# Patient Record
Sex: Female | Born: 1965 | Race: White | Hispanic: No | Smoking: Never smoker
Health system: Southern US, Community
[De-identification: ages and names within clinical notes are randomized; demographics above are authoritative.]

## PROBLEM LIST (undated history)

## (undated) DIAGNOSIS — R112 Nausea with vomiting, unspecified: Secondary | ICD-10-CM

## (undated) DIAGNOSIS — K219 Gastro-esophageal reflux disease without esophagitis: Secondary | ICD-10-CM

## (undated) DIAGNOSIS — I10 Essential (primary) hypertension: Secondary | ICD-10-CM

## (undated) DIAGNOSIS — F419 Anxiety disorder, unspecified: Secondary | ICD-10-CM

## (undated) DIAGNOSIS — T4145XA Adverse effect of unspecified anesthetic, initial encounter: Secondary | ICD-10-CM

## (undated) DIAGNOSIS — R251 Tremor, unspecified: Secondary | ICD-10-CM

## (undated) DIAGNOSIS — K259 Gastric ulcer, unspecified as acute or chronic, without hemorrhage or perforation: Secondary | ICD-10-CM

## (undated) DIAGNOSIS — T8859XA Other complications of anesthesia, initial encounter: Secondary | ICD-10-CM

## (undated) DIAGNOSIS — T1491XA Suicide attempt, initial encounter: Secondary | ICD-10-CM

## (undated) DIAGNOSIS — F32A Depression, unspecified: Secondary | ICD-10-CM

## (undated) DIAGNOSIS — G43909 Migraine, unspecified, not intractable, without status migrainosus: Secondary | ICD-10-CM

## (undated) DIAGNOSIS — F329 Major depressive disorder, single episode, unspecified: Secondary | ICD-10-CM

## (undated) DIAGNOSIS — D122 Benign neoplasm of ascending colon: Secondary | ICD-10-CM

## (undated) DIAGNOSIS — K635 Polyp of colon: Secondary | ICD-10-CM

## (undated) DIAGNOSIS — G44219 Episodic tension-type headache, not intractable: Secondary | ICD-10-CM

## (undated) DIAGNOSIS — Z9889 Other specified postprocedural states: Secondary | ICD-10-CM

## (undated) HISTORY — DX: Polyp of colon: K63.5

## (undated) HISTORY — DX: Depression, unspecified: F32.A

## (undated) HISTORY — DX: Major depressive disorder, single episode, unspecified: F32.9

## (undated) HISTORY — DX: Benign neoplasm of ascending colon: D12.2

## (undated) HISTORY — DX: Gastric ulcer, unspecified as acute or chronic, without hemorrhage or perforation: K25.9

## (undated) HISTORY — DX: Suicide attempt, initial encounter: T14.91XA

## (undated) HISTORY — DX: Tremor, unspecified: R25.1

## (undated) HISTORY — DX: Migraine, unspecified, not intractable, without status migrainosus: G43.909

## (undated) HISTORY — DX: Anxiety disorder, unspecified: F41.9

## (undated) HISTORY — DX: Episodic tension-type headache, not intractable: G44.219

---

## 1990-01-04 DIAGNOSIS — T1491XA Suicide attempt, initial encounter: Secondary | ICD-10-CM

## 1990-01-04 HISTORY — DX: Suicide attempt, initial encounter: T14.91XA

## 2001-07-17 ENCOUNTER — Other Ambulatory Visit: Admission: RE | Admit: 2001-07-17 | Discharge: 2001-07-17 | Payer: Self-pay | Admitting: Internal Medicine

## 2005-02-12 ENCOUNTER — Ambulatory Visit: Payer: Self-pay | Admitting: Podiatry

## 2006-01-10 ENCOUNTER — Ambulatory Visit: Payer: Self-pay | Admitting: Family Medicine

## 2006-12-14 ENCOUNTER — Emergency Department: Payer: Self-pay | Admitting: Emergency Medicine

## 2007-01-05 HISTORY — PX: HEEL SPUR EXCISION: SHX1733

## 2008-01-22 ENCOUNTER — Emergency Department: Payer: Self-pay | Admitting: Emergency Medicine

## 2008-11-01 ENCOUNTER — Emergency Department: Payer: Self-pay | Admitting: Internal Medicine

## 2009-07-28 ENCOUNTER — Other Ambulatory Visit: Payer: Self-pay | Admitting: Emergency Medicine

## 2009-08-28 ENCOUNTER — Ambulatory Visit: Payer: Self-pay | Admitting: Unknown Physician Specialty

## 2010-01-04 HISTORY — PX: ABDOMINAL HYSTERECTOMY: SHX81

## 2010-01-04 HISTORY — PX: TOTAL ABDOMINAL HYSTERECTOMY W/ BILATERAL SALPINGOOPHORECTOMY: SHX83

## 2010-01-04 HISTORY — PX: BILATERAL SALPINGOOPHORECTOMY: SHX1223

## 2010-01-04 LAB — HM PAP SMEAR

## 2010-01-28 ENCOUNTER — Ambulatory Visit: Payer: Self-pay | Admitting: Obstetrics & Gynecology

## 2010-02-05 ENCOUNTER — Ambulatory Visit: Payer: Self-pay | Admitting: Obstetrics & Gynecology

## 2010-02-06 LAB — PATHOLOGY REPORT

## 2010-04-30 ENCOUNTER — Emergency Department: Payer: Self-pay | Admitting: Emergency Medicine

## 2010-07-09 ENCOUNTER — Emergency Department: Payer: Self-pay | Admitting: Emergency Medicine

## 2010-08-26 ENCOUNTER — Ambulatory Visit: Payer: Self-pay | Admitting: Specialist

## 2011-02-23 ENCOUNTER — Ambulatory Visit: Payer: Self-pay | Admitting: Specialist

## 2011-10-05 LAB — HM MAMMOGRAPHY

## 2011-11-01 ENCOUNTER — Ambulatory Visit: Payer: Self-pay | Admitting: Obstetrics & Gynecology

## 2012-01-31 ENCOUNTER — Emergency Department: Payer: Self-pay | Admitting: Emergency Medicine

## 2012-01-31 LAB — URINALYSIS, COMPLETE
Glucose,UR: NEGATIVE mg/dL (ref 0–75)
Nitrite: POSITIVE
Protein: 100
RBC,UR: 13 /HPF (ref 0–5)

## 2012-01-31 LAB — COMPREHENSIVE METABOLIC PANEL
Albumin: 3.6 g/dL (ref 3.4–5.0)
BUN: 13 mg/dL (ref 7–18)
Bilirubin,Total: 0.7 mg/dL (ref 0.2–1.0)
Co2: 22 mmol/L (ref 21–32)
EGFR (African American): >60
Glucose: 103 mg/dL — ABNORMAL HIGH (ref 65–99)
SGOT(AST): 18 U/L (ref 15–37)
Sodium: 138 mmol/L (ref 136–145)
Total Protein: 7.8 g/dL (ref 6.4–8.2)

## 2012-01-31 LAB — CBC
HGB: 14.4 g/dL (ref 12.0–16.0)
MCH: 31 pg (ref 26.0–34.0)
WBC: 10.7 10*3/uL (ref 3.6–11.0)

## 2012-02-04 LAB — URINE CULTURE

## 2012-04-22 IMAGING — MG MAM DGTL SCREENING MAMMO W/CAD
1 series · 4 of 4 positions shown · non-contrast
Comparison: none

REASON FOR EXAM: screening
COMMENTS:  Submitted by practice: [facility] Scheduled by user: Jussandro
Jim

PROCEDURE:     MAM - MAM DGTL SCREENING MAMMO W/CAD  - August 28, 2009  [DATE]
RESULT:     Comparison: 01/10/2006

[Series 3464: R CC · right · 4 of 4 slices shown]
[im 1/4]
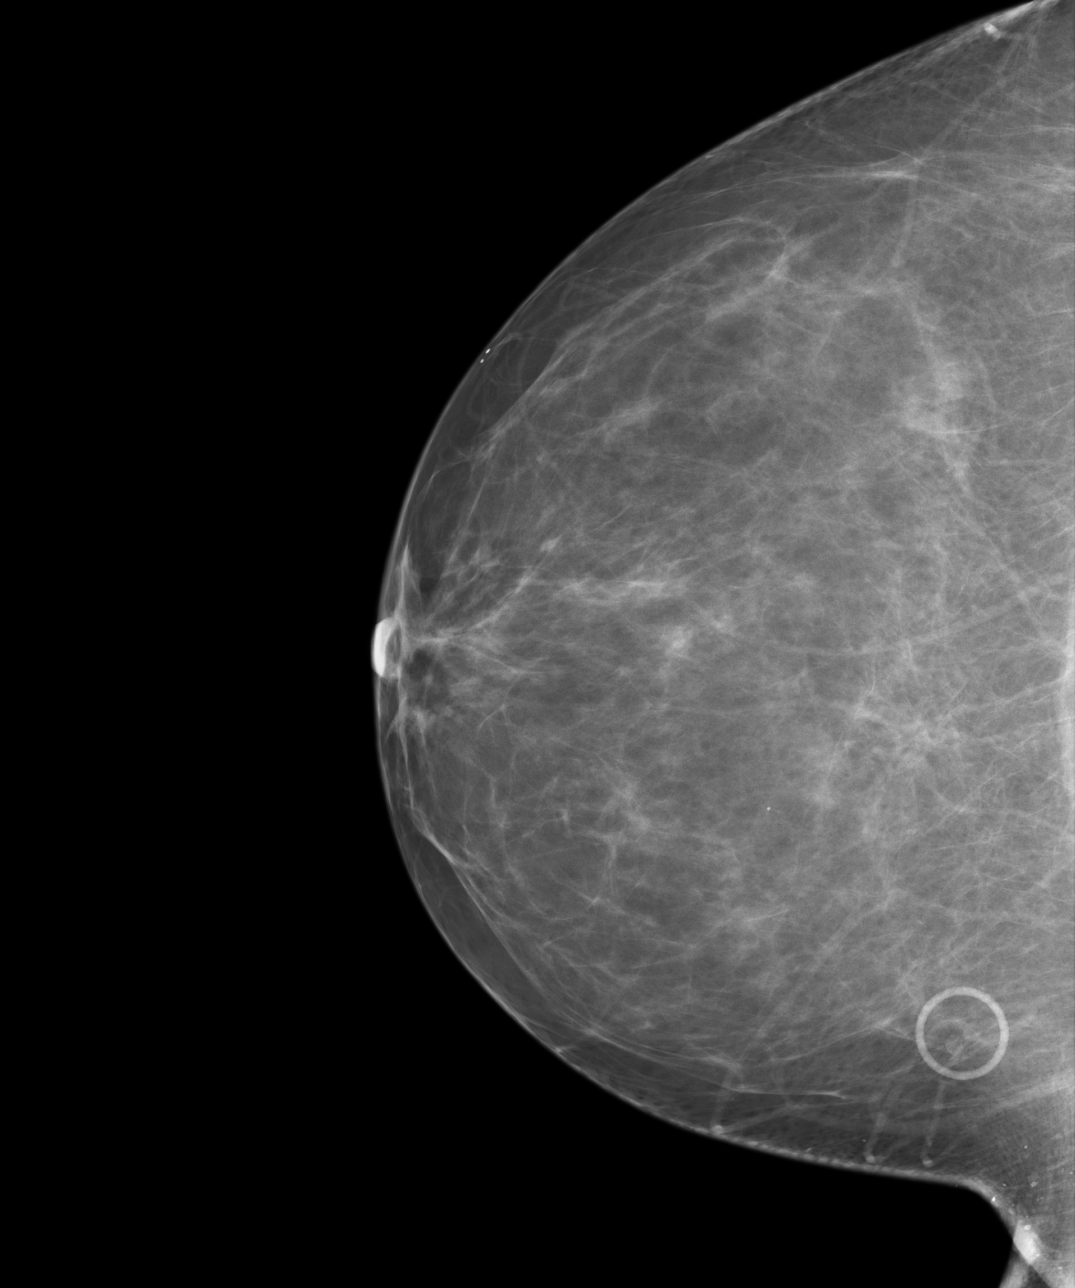
[im 2/4]
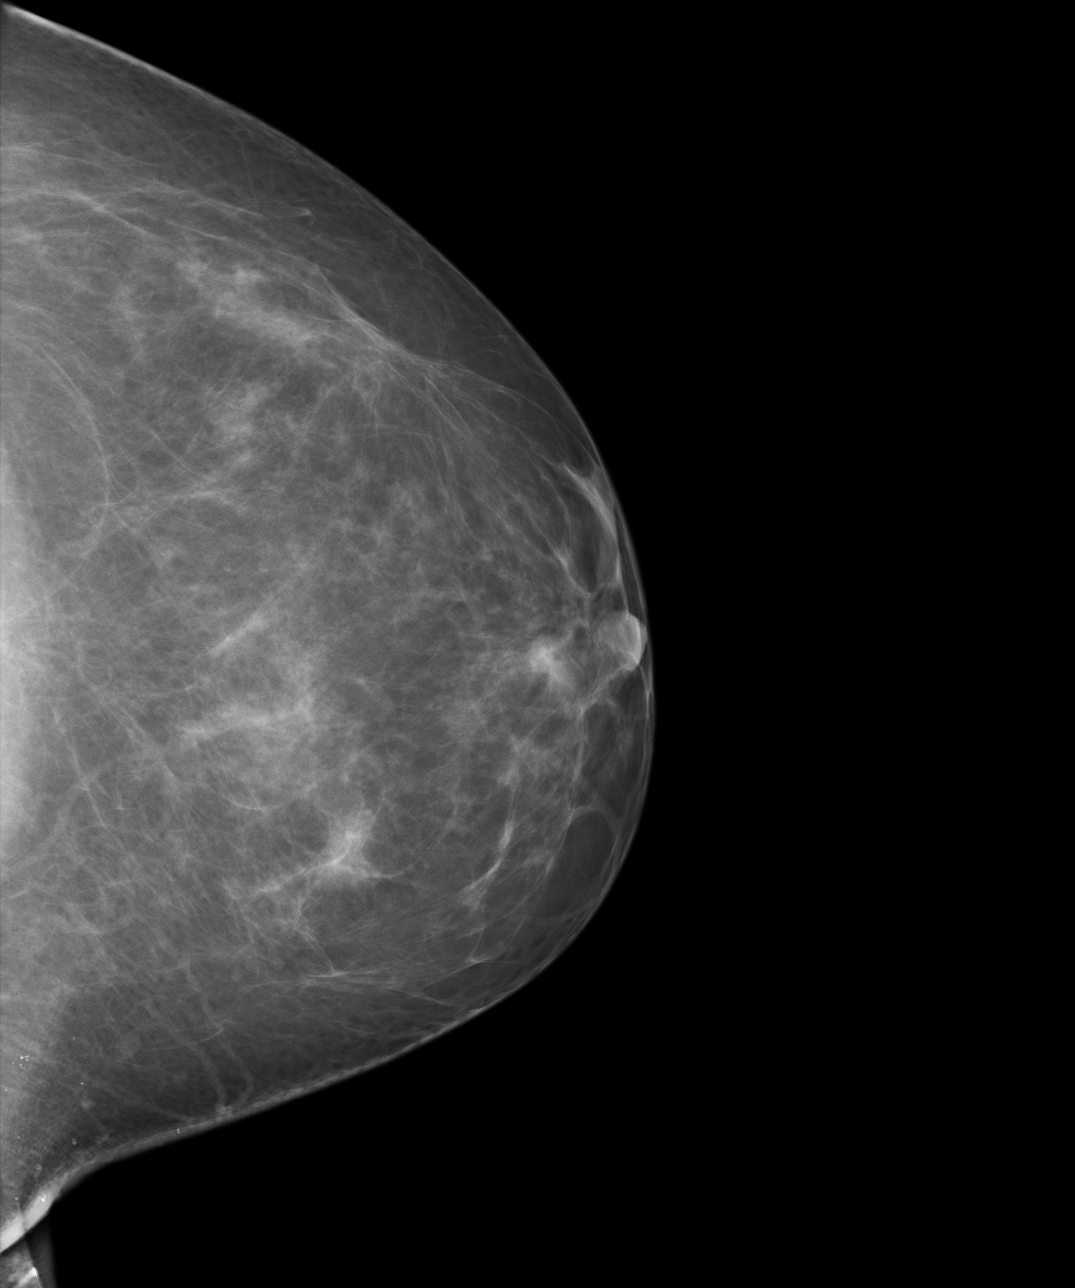
[im 3/4]
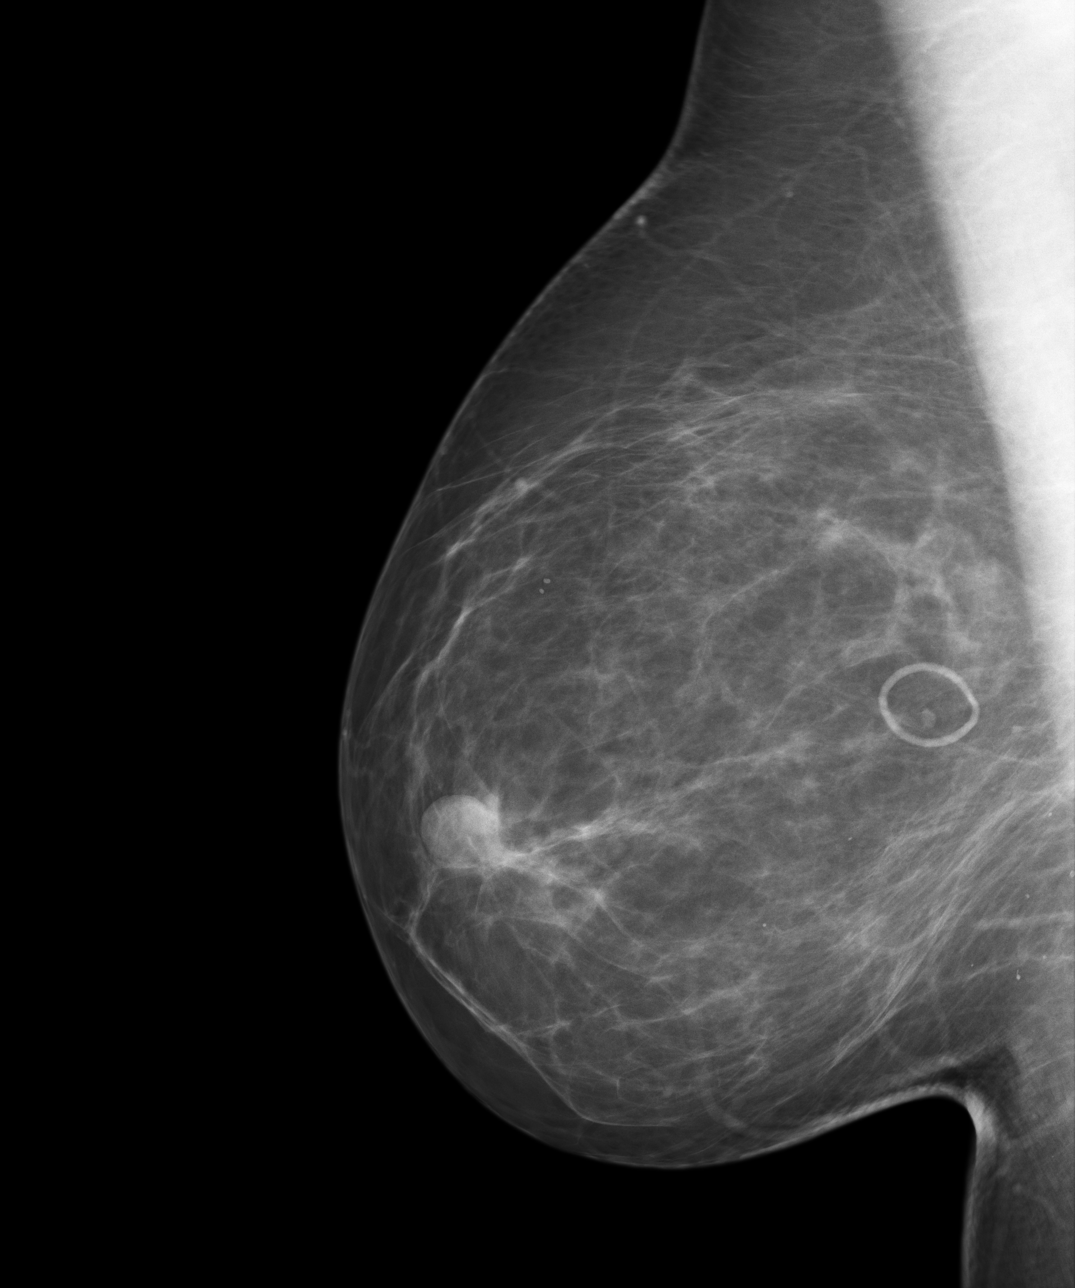
[im 4/4]
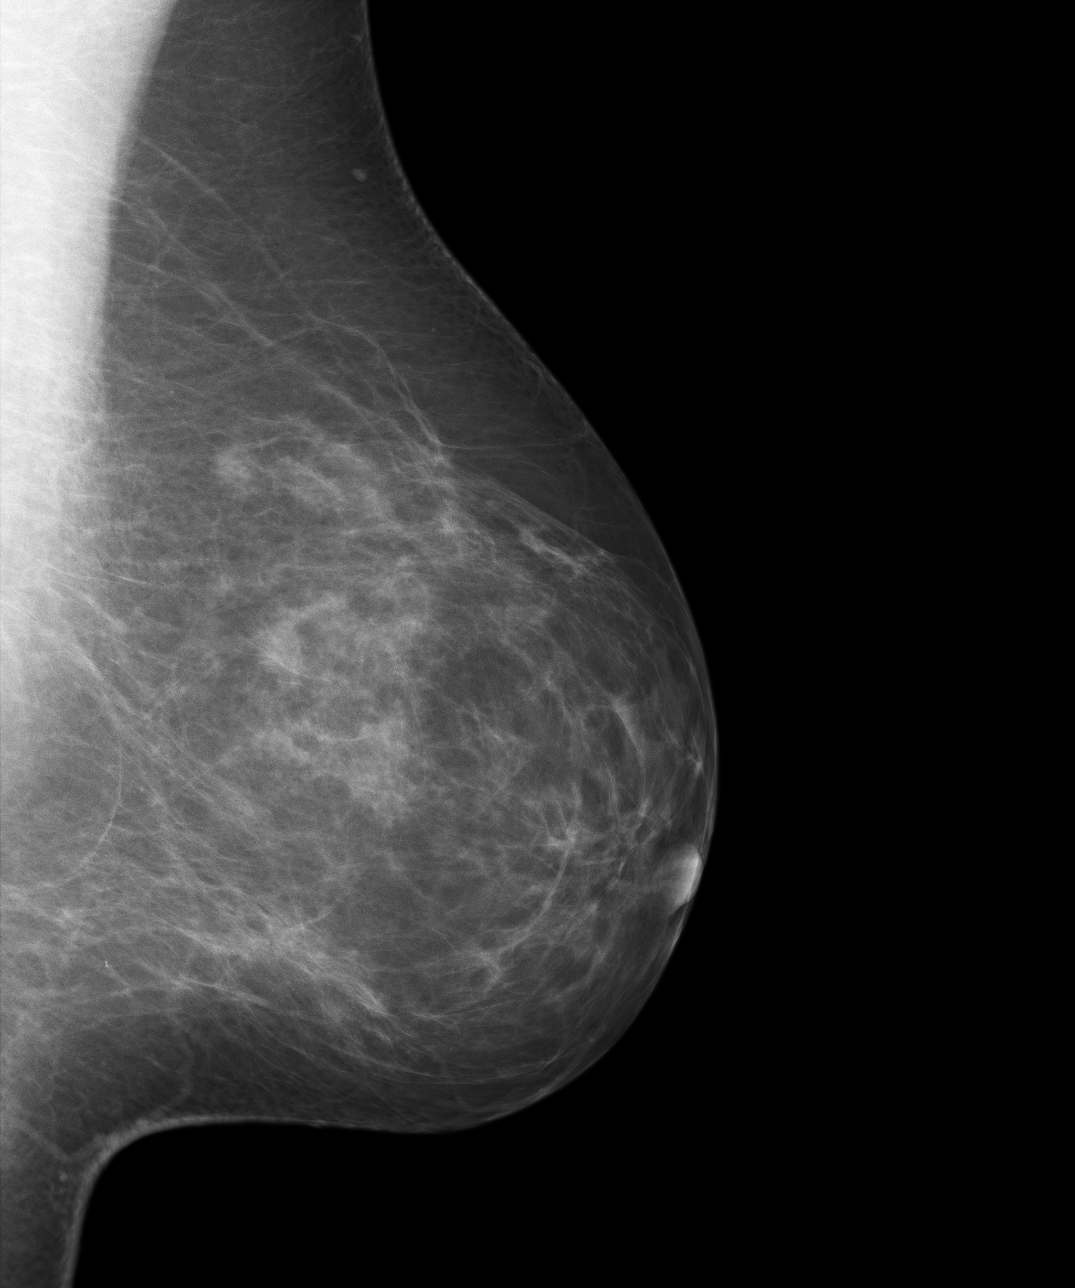

[4 of 4 positions shown; findings below may reference images not displayed]

FINDINGS: Bilateral breast a straight scattered frontal echodensity. There is no new
dominant mass, or central distortion or clusters of suspicious
microcalcifications.
IMPRESSION: Stable bilateral mammogram

Annual mammographic followup recommended

BI-RADS 2

ADDENDUM:  08/28/09-Correction to above report

COMPARISON STUDIES:   01/10/2006.
FINDINGS: Bilateral breast demonstrate a scattered fibroglandular density.
 There is no new dominant mass, architectural distortion or clusters of
suspicious-appearing microcalcifications.
IMPRESSION: 1.     Stable bilateral mammogram.
2.     Annual mammographic follow up recommended.
3.     BI-RADS:  Category 2- Benign Finding.

A negative mammogram report does not preclude biopsy or other evaluation of
a clinically palpable or otherwise suspicious mass or lesion.  Breast cancer
may not be detected by mammography in up to 10% of cases.

## 2013-01-03 ENCOUNTER — Encounter (INDEPENDENT_AMBULATORY_CARE_PROVIDER_SITE_OTHER): Payer: Self-pay

## 2013-01-03 ENCOUNTER — Encounter: Payer: Self-pay | Admitting: Adult Health

## 2013-01-03 ENCOUNTER — Ambulatory Visit (INDEPENDENT_AMBULATORY_CARE_PROVIDER_SITE_OTHER): Payer: 59 | Admitting: Adult Health

## 2013-01-03 VITALS — BP 118/76 | HR 68 | Temp 98.0°F | Resp 12 | Ht 65.0 in | Wt 177.5 lb

## 2013-01-03 DIAGNOSIS — Z1239 Encounter for other screening for malignant neoplasm of breast: Secondary | ICD-10-CM

## 2013-01-03 DIAGNOSIS — Z0000 Encounter for general adult medical examination without abnormal findings: Secondary | ICD-10-CM

## 2013-01-03 DIAGNOSIS — F341 Dysthymic disorder: Secondary | ICD-10-CM

## 2013-01-03 DIAGNOSIS — F418 Other specified anxiety disorders: Secondary | ICD-10-CM

## 2013-01-03 MED ORDER — ESCITALOPRAM OXALATE 10 MG PO TABS: 10.0000 mg | ORAL_TABLET | Freq: Every day | ORAL | Status: DC

## 2013-01-03 NOTE — Patient Instructions (Signed)
  Start Lexapro 10 mg every evening.  Return for a follow up visit in 4 weeks.  Thank you for choosing [place] [facility] for your health care needs.  Please have your labs drawn at your earliest convenience. You will need to be fasting for the blood work. You may drink water.   The results will be available through MyChart for your convenience. Please remember to activate this. The activation code is located at the end of this form.

## 2013-01-03 NOTE — Assessment & Plan Note (Signed)
Start Lexapro 10 mg every evening. RTC for follow up in 4 weeks. Lengthy conversation with patient regarding her current situation. She has taken the initial step to try to seek help by going to speak with a counselor. Now she is going to start medication to help with her depression and anxiety. Many behaviors are learned. It is apparent that her son, [name], has learned poor communication skills and perhaps this is the only way he knows how to express himself - through arguing. He witness his parents arguing incessantly. These behaviors are not easy to change. I encouraged her to continue to try to change the way she responds as well as we work on her own mental health. [name] will have to come to terms with what he wants to change.

## 2013-01-03 NOTE — Progress Notes (Signed)
Pre visit review using our clinic review tool, if applicable. No additional management support is needed unless otherwise documented below in the visit note. 
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
## 2013-01-11 ENCOUNTER — Other Ambulatory Visit (INDEPENDENT_AMBULATORY_CARE_PROVIDER_SITE_OTHER): Payer: 59

## 2013-01-11 DIAGNOSIS — Z1239 Encounter for other screening for malignant neoplasm of breast: Secondary | ICD-10-CM

## 2013-01-11 DIAGNOSIS — Z0000 Encounter for general adult medical examination without abnormal findings: Secondary | ICD-10-CM

## 2013-01-11 LAB — COMPREHENSIVE METABOLIC PANEL
ALT: 16 U/L (ref 0–35)
AST: 22 U/L (ref 0–37)
Albumin: 4.1 g/dL (ref 3.5–5.2)
Alkaline Phosphatase: 71 U/L (ref 39–117)
BUN: 9 mg/dL (ref 6–23)
CALCIUM: 8.9 mg/dL (ref 8.4–10.5)
CHLORIDE: 108 meq/L (ref 96–112)
CO2: 26 mEq/L (ref 19–32)
Creatinine, Ser: 0.6 mg/dL (ref 0.4–1.2)
GFR: 122.19 mL/min (ref >60.00)
Glucose, Bld: 80 mg/dL (ref 70–99)
Potassium: 4 mEq/L (ref 3.5–5.1)
Sodium: 142 mEq/L (ref 135–145)
Total Bilirubin: 0.7 mg/dL (ref 0.3–1.2)
Total Protein: 6.9 g/dL (ref 6.0–8.3)

## 2013-01-11 LAB — LIPID PANEL
Cholesterol: 199 mg/dL (ref 0–200)
HDL: 77.4 mg/dL (ref >39.00)
LDL CALC: 113 mg/dL — AB (ref 0–99)
TRIGLYCERIDES: 45 mg/dL (ref 0.0–149.0)
Total CHOL/HDL Ratio: 3
VLDL: 9 mg/dL (ref 0.0–40.0)

## 2013-01-11 LAB — CBC WITH DIFFERENTIAL/PLATELET
BASOS PCT: 0.4 % (ref 0.0–3.0)
Basophils Absolute: 0 10*3/uL (ref 0.0–0.1)
EOS ABS: 0.1 10*3/uL (ref 0.0–0.7)
EOS PCT: 1.9 % (ref 0.0–5.0)
HCT: 39.9 % (ref 36.0–46.0)
Hemoglobin: 13.8 g/dL (ref 12.0–15.0)
LYMPHS PCT: 39.2 % (ref 12.0–46.0)
Lymphs Abs: 1.6 10*3/uL (ref 0.7–4.0)
MCHC: 34.7 g/dL (ref 30.0–36.0)
MCV: 89.5 fl (ref 78.0–100.0)
Monocytes Absolute: 0.4 10*3/uL (ref 0.1–1.0)
Monocytes Relative: 9.4 % (ref 3.0–12.0)
NEUTROS PCT: 49.1 % (ref 43.0–77.0)
Neutro Abs: 2 10*3/uL (ref 1.4–7.7)
Platelets: 237 10*3/uL (ref 150.0–400.0)
RBC: 4.45 Mil/uL (ref 3.87–5.11)
RDW: 12.4 % (ref 11.5–14.6)
WBC: 4 10*3/uL — ABNORMAL LOW (ref 4.5–10.5)

## 2013-01-11 LAB — TSH: TSH: 0.73 u[IU]/mL (ref 0.35–5.50)

## 2013-01-11 LAB — VITAMIN B12: Vitamin B-12: 359 pg/mL (ref 211–911)

## 2013-01-12 ENCOUNTER — Encounter: Payer: Self-pay | Admitting: *Deleted

## 2013-01-12 LAB — VITAMIN D 25 HYDROXY (VIT D DEFICIENCY, FRACTURES): Vit D, 25-Hydroxy: 32 ng/mL (ref 30–89)

## 2013-01-16 ENCOUNTER — Telehealth: Payer: Self-pay | Admitting: *Deleted

## 2013-01-16 NOTE — Telephone Encounter (Signed)
Pt has been taking Lexapro daily at night since appointment, complaining of daily morning headaches. Please advise

## 2013-01-17 NOTE — Telephone Encounter (Signed)
Pt notified and verbalized understanding.

## 2013-01-17 NOTE — Telephone Encounter (Signed)
Epocrates does not have HA as common side effect. She has hx of migraines and tension HA. She can try taking it earlier in the evening to see if this improves morning HA. She can also try taking in the morning if it does not make her too sleepy.

## 2013-01-31 ENCOUNTER — Ambulatory Visit: Payer: Self-pay | Admitting: Adult Health

## 2013-02-08 ENCOUNTER — Ambulatory Visit (INDEPENDENT_AMBULATORY_CARE_PROVIDER_SITE_OTHER): Payer: 59 | Admitting: Adult Health

## 2013-02-08 ENCOUNTER — Encounter: Payer: Self-pay | Admitting: Adult Health

## 2013-02-08 VITALS — BP 112/76 | HR 57 | Resp 12 | Ht 65.0 in | Wt 174.5 lb

## 2013-02-08 DIAGNOSIS — F341 Dysthymic disorder: Secondary | ICD-10-CM

## 2013-02-08 DIAGNOSIS — F418 Other specified anxiety disorders: Secondary | ICD-10-CM

## 2013-02-08 MED ORDER — ESCITALOPRAM OXALATE 20 MG PO TABS: 20.0000 mg | ORAL_TABLET | Freq: Every day | ORAL | Status: DC

## 2013-02-08 MED ORDER — ALPRAZOLAM 0.25 MG PO TABS: ORAL_TABLET | ORAL | Status: DC

## 2013-02-08 NOTE — Progress Notes (Signed)
Patient ID: Cynthia Sellers, female   DOB: 1965/06/28, 48 y.o.   MRN: [id number]    Subjective:    Patient ID: Cynthia Sellers, female    DOB: [birth year], 48 y.o.   MRN: [id number]  HPI  Pt is a pleasant 48 y/o female who presents for f/u for depression with anxiety. She was started on Lexapro 10 mg 4 weeks ago and feels significant improvement. She describes herself as "overall calmer". She has days when she feels more anxious than others. She reports that the situation with her son is the same. However, her approach has been a more relaxed one.   Past Medical History  Diagnosis Date  . Depression   . Migraine   . Headache, tension type, episodic     Excedrin tension headache helps  . Suicide attempt 71    Took overdose of sleeping pills  . Anxiety     No current outpatient prescriptions on file prior to visit.   No current facility-administered medications on file prior to visit.     Review of Systems  Constitutional: Negative.   HENT: Negative.   Eyes: Negative.   Respiratory: Negative.   Cardiovascular: Negative.   Gastrointestinal: Negative.   Endocrine: Negative.   Genitourinary: Negative.   Musculoskeletal: Negative.   Skin: Negative.   Allergic/Immunologic: Negative.   Neurological: Negative.   Hematological: Negative.   Psychiatric/Behavioral: The patient is nervous/anxious.        Depression improved but feels she could handle an increase in dosage.       Objective:  BP 112/76  Pulse 57  Resp 12  Ht 5\' 5"  (1.651 m)  Wt 174 lb 8 oz (79.153 kg)  BMI 29.04 kg/m2  SpO2 93%   Physical Exam  Constitutional: She is oriented to person, place, and time. She appears well-developed and well-nourished. No distress.  HENT:  Head: Normocephalic and atraumatic.  Eyes: Conjunctivae and EOM are normal. Pupils are equal, round, and reactive to light.  Neck: Normal range of motion. Neck supple. No tracheal deviation present.  Cardiovascular: Normal rate,  regular rhythm, normal heart sounds and intact distal pulses.  Exam reveals no gallop and no friction rub.   No murmur heard. Pulmonary/Chest: Effort normal and breath sounds normal. No respiratory distress. She has no wheezes. She has no rales.  Abdominal: Soft. Bowel sounds are normal. She exhibits no distension and no mass. There is no tenderness. There is no rebound and no guarding.  Musculoskeletal: Normal range of motion.  Neurological: She is alert and oriented to person, place, and time. She has normal reflexes. Coordination normal.  Skin: Skin is warm and dry.  Psychiatric: She has a normal mood and affect. Her behavior is normal. Judgment and thought content normal.       Assessment & Plan:   1. Depression with anxiety Increase Lexapro to 20 mg daily. Will add xanax 0.25 mg daily prn for increased anxiety. Discussed high potential for addiction. Recommend using only as needed. Still recommend finding other calming activities that will help her with her stress and anxiety. Pt agreed.

## 2013-02-08 NOTE — Progress Notes (Signed)
Pre visit review using our clinic review tool, if applicable. No additional management support is needed unless otherwise documented below in the visit note. 

## 2013-02-19 ENCOUNTER — Encounter: Payer: Self-pay | Admitting: Adult Health

## 2013-04-16 ENCOUNTER — Other Ambulatory Visit: Payer: Self-pay | Admitting: Adult Health

## 2013-04-16 NOTE — Telephone Encounter (Signed)
Electronic Rx request, please advise. 

## 2013-05-08 ENCOUNTER — Encounter: Payer: Self-pay | Admitting: Adult Health

## 2013-05-08 ENCOUNTER — Ambulatory Visit (INDEPENDENT_AMBULATORY_CARE_PROVIDER_SITE_OTHER): Payer: 59 | Admitting: Adult Health

## 2013-05-08 VITALS — BP 110/74 | HR 64 | Temp 98.2°F | Resp 14 | Wt 168.8 lb

## 2013-05-08 DIAGNOSIS — F418 Other specified anxiety disorders: Secondary | ICD-10-CM

## 2013-05-08 DIAGNOSIS — F341 Dysthymic disorder: Secondary | ICD-10-CM

## 2013-05-08 MED ORDER — ALPRAZOLAM 0.25 MG PO TABS: 0.2500 mg | ORAL_TABLET | Freq: Every day | ORAL | Status: DC | PRN

## 2013-05-08 MED ORDER — ESCITALOPRAM OXALATE 20 MG PO TABS: 20.0000 mg | ORAL_TABLET | Freq: Every day | ORAL | Status: DC

## 2013-05-08 NOTE — Progress Notes (Signed)
Pre visit review using our clinic review tool, if applicable. No additional management support is needed unless otherwise documented below in the visit note. 
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
## 2013-06-05 ENCOUNTER — Encounter: Payer: Self-pay | Admitting: Adult Health

## 2014-01-08 ENCOUNTER — Ambulatory Visit: Payer: 59 | Admitting: Adult Health

## 2014-04-28 NOTE — Op Note (Signed)
PATIENT NAME:  Cynthia Sellers, Cynthia Sellers MR#:  [id number] DATE OF BIRTH:  1965-03-16  DATE OF PROCEDURE:  02/23/2011  PREOPERATIVE DIAGNOSIS: Left carpal tunnel syndrome.   POSTOPERATIVE DIAGNOSIS: Left carpal tunnel syndrome.   PROCEDURE: Left carpal tunnel release.   SURGEON: Lucas Mallow, MD   ANESTHESIA: IV regional.   COMPLICATIONS: None.   TOURNIQUET TIME: 30 minutes.   PROCEDURE: After adequate induction of IV regional anesthesia for the left upper extremity, the left upper extremity is thoroughly prepped with alcohol and ChloraPrep and draped in standard sterile fashion. Under loupe magnification, standard volar carpal tunnel incision is made and the dissection carried down under loupe magnification to the transverse retinacular ligament. This is incised in the midportion with the knife. The proximal release is performed with the small scissors and the carpal tunnel scissors and the distal release is performed with the small scissors. There is seen to be moderate compression of the nerve directly beneath the ligament. Careful check both distally and proximally showed there was no further constriction. The wound is thoroughly irrigated multiple times. Skin edges are infiltrated with 0.5% plain Marcaine. Skin is closed with 4-0 nylon. Soft bulky dressing is applied. Tourniquet is released. The patient is returned to the recovery room in satisfactory condition having tolerated the procedure quite well.   ____________________________ Lucas Mallow, MD ces:drc D: 02/23/2011 08:22:31 ET T: 02/23/2011 09:42:45 ET JOB#: [id number]  cc: Lucas Mallow, MD, <Dictator> Lucas Mallow MD ELECTRONICALLY SIGNED 02/24/2011 14:40

## 2015-09-10 ENCOUNTER — Ambulatory Visit: Payer: Self-pay

## 2015-10-01 ENCOUNTER — Ambulatory Visit
Admission: RE | Admit: 2015-10-01 | Discharge: 2015-10-01 | Disposition: A | Discharge status: Home / Self Care | Payer: Self-pay | Source: Ambulatory Visit | Attending: Oncology | Admitting: Oncology

## 2015-10-01 ENCOUNTER — Encounter: Payer: Self-pay | Admitting: *Deleted

## 2015-10-01 ENCOUNTER — Encounter (INDEPENDENT_AMBULATORY_CARE_PROVIDER_SITE_OTHER): Payer: Self-pay

## 2015-10-01 ENCOUNTER — Ambulatory Visit: Payer: Self-pay | Attending: Oncology | Admitting: *Deleted

## 2015-10-01 VITALS — BP 127/90 | HR 68 | Temp 97.9°F | Ht 63.39 in | Wt 188.5 lb

## 2015-10-01 DIAGNOSIS — N63 Unspecified lump in unspecified breast: Secondary | ICD-10-CM

## 2015-10-01 NOTE — Patient Instructions (Signed)
Gave patient hand-out, Women Staying Healthy, Active and Well from [facility], with education on breast health, pap smears, heart and colon health. 

## 2015-10-01 NOTE — Progress Notes (Signed)
Subjective:     Patient ID: Cynthia Sellers, female   DOB: August 08, 1965, 50 y.o.   MRN: [id number]  HPI   Review of Systems     Objective:   Physical Exam  Pulmonary/Chest: Right breast exhibits mass. Right breast exhibits no inverted nipple, no nipple discharge, no skin change and no tenderness. Left breast exhibits mass. Left breast exhibits no inverted nipple, no nipple discharge, no skin change and no tenderness.         Assessment:     50 year old White female presents to [facility] for clinical breast exam and mammogram. On clinical breast exam I can visually see a raised area at 5-6:00 left breast.  There is a large smooth, mobile, 5X5 cm nodule at 5-6:00 left breast.  There is also an approximate 2-3 cm thickening at 7:00 right breast.  Taught self breast awareness.  Patient states she has not noticed this area before I palpated it today.  Family history of cancer includes one brother with brain cancer. Pap and pelvic omitted per protocol for patient with a history of a total hysterectomy.  Patient has been screened for eligibility.  She does not have any insurance, Medicare or Medicaid.  She also meets financial eligibility.  Hand-out given on the Affordable Care Act.    Plan:     Will have Jeanella Anton schedule patient for a bilateral diagnostic mammogram and ultrasound.  Will follow up per [facility] protocol.

## 2015-10-10 ENCOUNTER — Other Ambulatory Visit: Payer: Self-pay

## 2015-10-10 ENCOUNTER — Ambulatory Visit: Payer: Self-pay

## 2015-11-06 ENCOUNTER — Encounter: Payer: Self-pay | Admitting: *Deleted

## 2015-11-06 NOTE — Progress Notes (Signed)
Talked to patient today.  Reviewed her mammogram results of a lipoma.  Offered surgical consult for second opinion.  States she is comfortable with the radiology results.  States she will call and let me know if she has any changes.  Patient was encouraged to follow-up in one year with annual screening, unless the lipoma feels to be getting larger.

## 2017-06-06 ENCOUNTER — Encounter (INDEPENDENT_AMBULATORY_CARE_PROVIDER_SITE_OTHER): Payer: Self-pay

## 2017-06-06 ENCOUNTER — Ambulatory Visit: Payer: Self-pay | Attending: Oncology | Admitting: *Deleted

## 2017-06-06 ENCOUNTER — Ambulatory Visit
Admission: RE | Admit: 2017-06-06 | Discharge: 2017-06-06 | Disposition: A | Discharge status: Home / Self Care | Payer: Self-pay | Source: Ambulatory Visit | Attending: Oncology | Admitting: Oncology

## 2017-06-06 VITALS — BP 151/98 | HR 65 | Temp 98.1°F | Ht 65.0 in | Wt 184.0 lb

## 2017-06-06 DIAGNOSIS — Z0000 Encounter for general adult medical examination without abnormal findings: Secondary | ICD-10-CM

## 2017-06-06 NOTE — Patient Instructions (Signed)

## 2017-06-06 NOTE — Progress Notes (Signed)
  Subjective:     Patient ID: Cynthia Sellers, female   DOB: [birth year], 52 y.o.   MRN: [id number]  HPI   Review of Systems     Objective:   Physical Exam  Pulmonary/Chest:         Assessment:     52 year old White female returns to [facility] for annual screening.  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  On clinical breast exam I can palpate an approximate 5X5 cm mobile mass at 3-5:00 left breast at the same site of previous lipoma noted on mammography.  I can also palpate again the same palpable thickening noted at 7:00 left breast that was also palpable in 2017.  Taught self breast awareness.  In review of patient's hysterectomy pathology it does not state that the cervix was removed.  On pelvic exam, the cervix is present.  Specimen collected for pap smear.  Patient informed she should follow pap smear guidelines, and that I will tell her when her next pap will be due. Patient has been screened for eligibility.  She does not have any insurance, Medicare or Medicaid.  She also meets financial eligibility.  Hand-out given on the Affordable Care Act.     Plan:     Screening mammogram ordered since same breast mass was noted to be a lipoma.  Specimen for pap sent to the lab.

## 2017-06-09 ENCOUNTER — Encounter: Payer: Self-pay | Admitting: *Deleted

## 2017-06-09 NOTE — Progress Notes (Signed)
Left message for patient to return my call.  I would like to send her to see a surgeon for a second opinion of the palpable lipoma like mass.

## 2017-06-10 LAB — PAP LB AND HPV HIGH-RISK
HPV, high-risk: NEGATIVE
PAP SMEAR COMMENT: 0

## 2017-06-20 ENCOUNTER — Encounter: Payer: Self-pay | Admitting: *Deleted

## 2017-06-30 ENCOUNTER — Encounter: Payer: Self-pay | Admitting: General Surgery

## 2017-06-30 ENCOUNTER — Ambulatory Visit (INDEPENDENT_AMBULATORY_CARE_PROVIDER_SITE_OTHER): Payer: Self-pay | Admitting: General Surgery

## 2017-06-30 VITALS — BP 132/82 | HR 63 | Resp 16 | Ht 65.0 in | Wt 186.0 lb

## 2017-06-30 DIAGNOSIS — D171 Benign lipomatous neoplasm of skin and subcutaneous tissue of trunk: Secondary | ICD-10-CM

## 2017-06-30 NOTE — Patient Instructions (Addendum)
  Patient to have a left breast excision at [facility] . The patient is aware to call back for any questions or concerns.  The patient is scheduled for surgery at [facility] on 07/13/17. She will pre admit by phone. The patient is aware of date and instructions.

## 2017-06-30 NOTE — Progress Notes (Signed)
Patient ID: Cynthia Sellers, female   DOB: 24-Oct-1965, 52 y.o.   MRN: [id number]  Chief Complaint  Patient presents with  . Breast Mass    HPI Cynthia Sellers is a 52 y.o. female who presents for a breast evaluation. The most recent mammogram was done on 06/06/2017. She states she found the lump left lateral breast about 2-3 years but it has gotten larger over the past year. She states it started out as marble size and now is golf ball size. Soreness but no pain.  Tanya Nones noticed the mass in her left breast last year as well. No pain or tenderness.  Patient does perform regular self breast checks and gets regular mammograms done.     6 x 7 cm soft rubbery HPI  Past Medical History:  Diagnosis Date  . Anxiety   . Depression   . Headache, tension type, episodic    Excedrin tension headache helps  . Migraine   . Suicide attempt ([place]) 1992   Took overdose of sleeping pills  . Tremor of both hands     Past Surgical History:  Procedure Laterality Date  . ABDOMINAL HYSTERECTOMY  2012  . BILATERAL SALPINGOOPHORECTOMY  2012  . HEEL SPUR EXCISION Left 2009    Family History  Problem Relation Age of Onset  . Heart disease Father   . Cancer Brother        throat and brain cancer  . Heart disease Brother        deceased from MI  . Arthritis Paternal Grandmother   . Heart disease Paternal Grandmother   . Hypertension Paternal Grandmother   . Heart disease Paternal Grandfather   . Stroke Paternal Grandfather   . Hypertension Paternal Grandfather     Social History Social History   Tobacco Use  . Smoking status: Never Smoker  . Smokeless tobacco: Never Used  Substance Use Topics  . Alcohol use: Never    Frequency: Never    Comment: Occasionally  . Drug use: No    No Known Allergies  Current Outpatient Medications  Medication Sig Dispense Refill  . Multiple Vitamin (MULTIVITAMIN) capsule Take 1 capsule by mouth daily.     No current facility-administered  medications for this visit.     Review of Systems Review of Systems  Constitutional: Negative.   Respiratory: Negative.   Cardiovascular: Negative.     Blood pressure 132/82, pulse 63, resp. rate 16, height 5\' 5"  (1.651 m), weight 186 lb (84.4 kg), SpO2 98 %.  Physical Exam Physical Exam  Constitutional: She is oriented to person, place, and time. She appears well-developed and well-nourished.  Cardiovascular: Normal rate, regular rhythm and normal heart sounds.  Pulmonary/Chest: Effort normal and breath sounds normal. Right breast exhibits mass (6 by 7 lower outer quadrant lipoma. ). Breasts are asymmetrical (right bigger than left.).    Neurological: She is alert and oriented to person, place, and time.  Skin: Skin is warm and dry.    Data Reviewed June 06, 2017 bilateral screening mammograms reviewed.  Nearly fatty replaced breast.  BI-RADS-1.  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    Plan Pros and cons of office versus hospital excision reviewed.  Based on its size and location I think she would be best to do this with sedation at the hospital.  Patient to have a left breast excision at [facility] . The patient is aware to call back for any questions or concerns.  HPI, Physical Exam, Assessment and Plan have been scribed under the direction and in the presence of Robert Bellow, MD. Karie Fetch, RN  The patient is scheduled for surgery at [facility] on 07/13/17. She will pre admit by phone. The patient is aware of date and instructions.  Documented by Lesly Rubenstein LPN  I have completed the exam and reviewed the above documentation for accuracy and completeness.  I agree with the above.  Haematologist has been used and any errors in dictation or transcription are unintentional.  Hervey Ard, M.D., F.A.C.S. [place] [facility] N[place] Chock 07/01/2017, 7:12 PM

## 2017-07-01 DIAGNOSIS — D171 Benign lipomatous neoplasm of skin and subcutaneous tissue of trunk: Secondary | ICD-10-CM

## 2017-07-01 HISTORY — DX: Benign lipomatous neoplasm of skin and subcutaneous tissue of trunk: D17.1

## 2017-07-05 ENCOUNTER — Inpatient Hospital Stay: Admission: RE | Admit: 2017-07-05 | Payer: Self-pay | Source: Ambulatory Visit

## 2017-07-06 ENCOUNTER — Encounter
Admission: RE | Admit: 2017-07-06 | Discharge: 2017-07-06 | Disposition: A | Discharge status: Home / Self Care | Payer: Self-pay | Source: Ambulatory Visit | Attending: General Surgery | Admitting: General Surgery

## 2017-07-06 ENCOUNTER — Other Ambulatory Visit: Payer: Self-pay

## 2017-07-06 HISTORY — DX: Gastro-esophageal reflux disease without esophagitis: K21.9

## 2017-07-06 HISTORY — DX: Other complications of anesthesia, initial encounter: T88.59XA

## 2017-07-06 HISTORY — DX: Other specified postprocedural states: Z98.890

## 2017-07-06 HISTORY — DX: Adverse effect of unspecified anesthetic, initial encounter: T41.45XA

## 2017-07-06 HISTORY — DX: Other specified postprocedural states: R11.2

## 2017-07-06 NOTE — Patient Instructions (Signed)
Your procedure is scheduled on: 07-13-17  Report to Same Day Surgery 2nd floor medical mall [facility] Entrance-take elevator on left to 2nd floor.  Check in with surgery information desk.) To find out your arrival time please call [phone] between 1PM - 3PM on 07-12-17  Remember: Instructions that are not followed completely may result in serious medical risk, up to and including death, or upon the discretion of your surgeon and anesthesiologist your surgery may need to be rescheduled.    _x___ 1. Do not eat food after midnight the night before your procedure. NO GUM OR CANDY AFTER MIDNIGHT.  You may drink clear liquids up to 2 hours before you are scheduled to arrive at the hospital for your procedure.  Do not drink clear liquids within 2 hours of your scheduled arrival to the hospital.  Clear liquids include  --Water or Apple juice without pulp  --Clear carbohydrate beverage such as ClearFast or Gatorade  --Black Coffee or Clear Tea (No milk, no creamers, do not add anything to the coffee or Tea    __x__ 2. No Alcohol for 24 hours before or after surgery.   __x__3. No Smoking or e-cigarettes for 24 prior to surgery.  Do not use any chewable tobacco products for at least 6 hour prior to surgery   ____  4. Bring all medications with you on the day of surgery if instructed.    __x__ 5. Notify your doctor if there is any change in your medical condition     (cold, fever, infections).    x___6. On the morning of surgery brush your teeth with toothpaste and water.  You may rinse your mouth with mouth wash if you wish.  Do not swallow any toothpaste or mouthwash.   Do not wear jewelry, make-up, hairpins, clips or nail polish.  Do not wear lotions, powders, or perfumes. You may wear deodorant.  Do not shave 48 hours prior to surgery. Men may shave face and neck.  Do not bring valuables to the hospital.    [facility] is not responsible for any belongings or valuables.    Contacts, dentures or bridgework may not be worn into surgery.  Leave your suitcase in the car. After surgery it may be brought to your room.  For patients admitted to the hospital, discharge time is determined by your treatment team.  _  Patients discharged the day of surgery will not be allowed to drive home.  You will need someone to drive you home and stay with you the night of your procedure.    Please read over the following fact sheets that you were given:   [facility] Preparing for Surgery    _x___ TAKE THE FOLLOWING MEDICATION THE MORNING OF SURGERY WITH A SMALL SIP OF WATER. These include:  1. PRILOSEC  2. TAKE A PRILOSEC THE NIGHT BEFORE YOUR SURGERY  3.  4.  5.  6.  ____Fleets enema or Magnesium Citrate as directed.   _x___ Use CHG Soap or sage wipes as directed on instruction sheet   ____ Use inhalers on the day of surgery and bring to hospital day of surgery  ____ Stop Metformin and Janumet 2 days prior to surgery.    ____ Take 1/2 of usual insulin dose the night before surgery and none on the morning surgery.   ____ Follow recommendations from Cardiologist, Pulmonologist or PCP regarding stopping Aspirin, Coumadin, Plavix ,Eliquis, Effient, or Pradaxa, and Pletal.  ____Stop Anti-inflammatories such as Advil, Aleve, Ibuprofen,  Motrin, Naproxen, Naprosyn, Goodies powders or aspirin products. OK to take Tylenol    ____ Stop supplements until after surgery.     ____ Bring C-Pap to the hospital.

## 2017-07-13 ENCOUNTER — Encounter
Admission: RE | Disposition: A | Discharge status: Home / Self Care | Payer: Self-pay | Source: Ambulatory Visit | Attending: General Surgery

## 2017-07-13 ENCOUNTER — Ambulatory Visit
Admission: RE | Admit: 2017-07-13 | Discharge: 2017-07-13 | Disposition: A | Discharge status: Home / Self Care | Payer: Self-pay | Source: Ambulatory Visit | Attending: General Surgery | Admitting: General Surgery

## 2017-07-13 ENCOUNTER — Ambulatory Visit: Payer: Self-pay | Admitting: Certified Registered Nurse Anesthetist

## 2017-07-13 ENCOUNTER — Telehealth: Payer: Self-pay | Admitting: *Deleted

## 2017-07-13 ENCOUNTER — Encounter: Payer: Self-pay | Admitting: *Deleted

## 2017-07-13 ENCOUNTER — Other Ambulatory Visit: Payer: Self-pay

## 2017-07-13 DIAGNOSIS — D171 Benign lipomatous neoplasm of skin and subcutaneous tissue of trunk: Secondary | ICD-10-CM

## 2017-07-13 HISTORY — PX: LIPOMA EXCISION: SHX5283

## 2017-07-13 SURGERY — EXCISION LIPOMA
Anesthesia: General | Laterality: Left | Wound class: Clean

## 2017-07-13 MED ORDER — MEPERIDINE HCL 50 MG/ML IJ SOLN: 6.2500 mg | INTRAMUSCULAR | Status: DC | PRN

## 2017-07-13 MED ORDER — LACTATED RINGERS IV SOLN
INTRAVENOUS | Status: DC
  Administered 2017-07-13: 11:00:00 via INTRAVENOUS

## 2017-07-13 MED ORDER — PROPOFOL 10 MG/ML IV BOLUS
INTRAVENOUS | Status: AC
  Filled 2017-07-13: qty 20

## 2017-07-13 MED ORDER — ACETAMINOPHEN 325 MG PO TABS: 325.0000 mg | ORAL_TABLET | ORAL | Status: DC | PRN

## 2017-07-13 MED ORDER — HYDROCODONE-ACETAMINOPHEN 5-325 MG PO TABS: 1.0000 | ORAL_TABLET | ORAL | 0 refills | Status: AC | PRN

## 2017-07-13 MED ORDER — ACETAMINOPHEN 10 MG/ML IV SOLN
INTRAVENOUS | Status: DC | PRN
  Administered 2017-07-13: 1000 mg via INTRAVENOUS

## 2017-07-13 MED ORDER — ONDANSETRON HCL 4 MG/2ML IJ SOLN
INTRAMUSCULAR | Status: AC
  Filled 2017-07-13: qty 2

## 2017-07-13 MED ORDER — LIDOCAINE HCL (CARDIAC) PF 100 MG/5ML IV SOSY
PREFILLED_SYRINGE | INTRAVENOUS | Status: DC | PRN
  Administered 2017-07-13: 100 mg via INTRAVENOUS

## 2017-07-13 MED ORDER — EPHEDRINE SULFATE 50 MG/ML IJ SOLN
INTRAMUSCULAR | Status: DC | PRN
  Administered 2017-07-13: 5 mg via INTRAVENOUS
  Administered 2017-07-13: 10 mg via INTRAVENOUS
  Administered 2017-07-13: 5 mg via INTRAVENOUS

## 2017-07-13 MED ORDER — BUPIVACAINE-EPINEPHRINE (PF) 0.5% -1:200000 IJ SOLN
INTRAMUSCULAR | Status: AC
  Filled 2017-07-13: qty 30

## 2017-07-13 MED ORDER — BUPIVACAINE-EPINEPHRINE (PF) 0.5% -1:200000 IJ SOLN
INTRAMUSCULAR | Status: DC | PRN
  Administered 2017-07-13: 20 mL via PERINEURAL
  Administered 2017-07-13: 10 mL via PERINEURAL

## 2017-07-13 MED ORDER — MIDAZOLAM HCL 2 MG/2ML IJ SOLN
INTRAMUSCULAR | Status: AC
  Filled 2017-07-13: qty 2

## 2017-07-13 MED ORDER — HYDROMORPHONE HCL 1 MG/ML IJ SOLN: 0.2500 mg | INTRAMUSCULAR | Status: DC | PRN

## 2017-07-13 MED ORDER — GLYCOPYRROLATE 0.2 MG/ML IJ SOLN
INTRAMUSCULAR | Status: DC | PRN
  Administered 2017-07-13: 0.2 mg via INTRAVENOUS

## 2017-07-13 MED ORDER — SCOPOLAMINE 1 MG/3DAYS TD PT72
MEDICATED_PATCH | TRANSDERMAL | Status: AC
  Administered 2017-07-13: 1.5 mg via TRANSDERMAL
  Filled 2017-07-13: qty 1

## 2017-07-13 MED ORDER — KETOROLAC TROMETHAMINE 30 MG/ML IJ SOLN
INTRAMUSCULAR | Status: DC | PRN
  Administered 2017-07-13: 30 mg via INTRAVENOUS

## 2017-07-13 MED ORDER — PROMETHAZINE HCL 25 MG/ML IJ SOLN: 6.2500 mg | INTRAMUSCULAR | Status: DC | PRN

## 2017-07-13 MED ORDER — ACETAMINOPHEN 10 MG/ML IV SOLN
INTRAVENOUS | Status: AC
Start: 2017-07-13 — End: ?
  Filled 2017-07-13: qty 100

## 2017-07-13 MED ORDER — DEXAMETHASONE SODIUM PHOSPHATE 10 MG/ML IJ SOLN
INTRAMUSCULAR | Status: DC | PRN
  Administered 2017-07-13: 10 mg via INTRAVENOUS

## 2017-07-13 MED ORDER — SCOPOLAMINE 1 MG/3DAYS TD PT72
1.0000 | MEDICATED_PATCH | TRANSDERMAL | Status: DC
  Administered 2017-07-13: 1.5 mg via TRANSDERMAL

## 2017-07-13 MED ORDER — FENTANYL CITRATE (PF) 100 MCG/2ML IJ SOLN
INTRAMUSCULAR | Status: DC | PRN
  Administered 2017-07-13: 25 ug via INTRAVENOUS
  Administered 2017-07-13: 50 ug via INTRAVENOUS
  Administered 2017-07-13: 25 ug via INTRAVENOUS

## 2017-07-13 MED ORDER — DEXAMETHASONE SODIUM PHOSPHATE 10 MG/ML IJ SOLN
INTRAMUSCULAR | Status: AC
  Filled 2017-07-13: qty 1

## 2017-07-13 MED ORDER — PROPOFOL 10 MG/ML IV BOLUS
INTRAVENOUS | Status: DC | PRN
  Administered 2017-07-13: 200 mg via INTRAVENOUS

## 2017-07-13 MED ORDER — OXYCODONE HCL 5 MG/5ML PO SOLN: 5.0000 mg | Freq: Once | ORAL | Status: DC | PRN

## 2017-07-13 MED ORDER — ONDANSETRON HCL 4 MG/2ML IJ SOLN
INTRAMUSCULAR | Status: DC | PRN
  Administered 2017-07-13: 4 mg via INTRAVENOUS

## 2017-07-13 MED ORDER — HYDROCODONE-ACETAMINOPHEN 5-325 MG PO TABS: 1.0000 | ORAL_TABLET | ORAL | 0 refills | Status: DC | PRN

## 2017-07-13 MED ORDER — OXYCODONE HCL 5 MG PO TABS: 5.0000 mg | ORAL_TABLET | Freq: Once | ORAL | Status: DC | PRN

## 2017-07-13 MED ORDER — MIDAZOLAM HCL 2 MG/2ML IJ SOLN
INTRAMUSCULAR | Status: DC | PRN
  Administered 2017-07-13: 2 mg via INTRAVENOUS

## 2017-07-13 MED ORDER — ACETAMINOPHEN 160 MG/5ML PO SOLN
325.0000 mg | ORAL | Status: DC | PRN
  Filled 2017-07-13: qty 20.3

## 2017-07-13 MED ORDER — FENTANYL CITRATE (PF) 100 MCG/2ML IJ SOLN
INTRAMUSCULAR | Status: AC
  Filled 2017-07-13: qty 2

## 2017-07-13 SURGICAL SUPPLY — 51 items
BINDER BREAST LRG (GAUZE/BANDAGES/DRESSINGS) ×2 IMPLANT
BINDER BREAST XLRG (GAUZE/BANDAGES/DRESSINGS) IMPLANT
BLADE PHOTON ILLUMINATED (MISCELLANEOUS) IMPLANT
BLADE SURG 15 STRL SS SAFETY (BLADE) ×4 IMPLANT
BULB RESERV EVAC DRAIN JP 100C (MISCELLANEOUS) IMPLANT
CANISTER SUCT 1200ML W/VALVE (MISCELLANEOUS) ×3 IMPLANT
CHLORAPREP W/TINT 26ML (MISCELLANEOUS) ×3 IMPLANT
CLOSURE WOUND 1/2 X4 (GAUZE/BANDAGES/DRESSINGS) ×1
CNTNR SPEC 2.5X3XGRAD LEK (MISCELLANEOUS)
CONT SPEC 4OZ STER OR WHT (MISCELLANEOUS)
CONT SPEC 4OZ STRL OR WHT (MISCELLANEOUS)
CONTAINER SPEC 2.5X3XGRAD LEK (MISCELLANEOUS) ×1 IMPLANT
COVER PROBE FLX POLY STRL (MISCELLANEOUS) ×1 IMPLANT
DEVICE DUBIN SPECIMEN MAMMOGRA (MISCELLANEOUS) ×1 IMPLANT
DRAIN CHANNEL JP 15F RND 16 (MISCELLANEOUS) IMPLANT
DRAPE LAPAROTOMY TRNSV 106X77 (MISCELLANEOUS) ×3 IMPLANT
DRSG GAUZE FLUFF 36X18 (GAUZE/BANDAGES/DRESSINGS) ×3 IMPLANT
DRSG TELFA 3X8 NADH (GAUZE/BANDAGES/DRESSINGS) ×3 IMPLANT
ELECT CAUTERY BLADE TIP 2.5 (TIP) ×3
ELECT REM PT RETURN 9FT ADLT (ELECTROSURGICAL) ×3
ELECTRODE CAUTERY BLDE TIP 2.5 (TIP) ×1 IMPLANT
ELECTRODE REM PT RTRN 9FT ADLT (ELECTROSURGICAL) ×1 IMPLANT
GLOVE BIO SURGEON STRL SZ7.5 (GLOVE) ×5 IMPLANT
GLOVE INDICATOR 8.0 STRL GRN (GLOVE) ×5 IMPLANT
GOWN STRL REUS W/ TWL LRG LVL3 (GOWN DISPOSABLE) ×2 IMPLANT
GOWN STRL REUS W/TWL LRG LVL3 (GOWN DISPOSABLE) ×6
KIT TURNOVER KIT A (KITS) ×3 IMPLANT
LABEL OR SOLS (LABEL) ×3 IMPLANT
MARGIN MAP 10MM (MISCELLANEOUS) ×3 IMPLANT
NDL HYPO 25X1 1.5 SAFETY (NEEDLE) ×2 IMPLANT
NEEDLE HYPO 22GX1.5 SAFETY (NEEDLE) ×3 IMPLANT
NEEDLE HYPO 25X1 1.5 SAFETY (NEEDLE) ×6 IMPLANT
PACK BASIN MINOR ARMC (MISCELLANEOUS) ×3 IMPLANT
PAD DRESSING TELFA 3X8 NADH (GAUZE/BANDAGES/DRESSINGS) ×1 IMPLANT
SHEARS FOC LG CVD HARMONIC 17C (MISCELLANEOUS) IMPLANT
SHEARS HARMONIC 9CM CVD (BLADE) IMPLANT
STRIP CLOSURE SKIN 1/2X4 (GAUZE/BANDAGES/DRESSINGS) ×2 IMPLANT
SUT ETHILON 3-0 FS-10 30 BLK (SUTURE) ×3
SUT SILK 2 0 (SUTURE) ×3
SUT SILK 2-0 18XBRD TIE 12 (SUTURE) ×1 IMPLANT
SUT VIC AB 2-0 CT1 27 (SUTURE) ×3
SUT VIC AB 2-0 CT1 TAPERPNT 27 (SUTURE) ×2 IMPLANT
SUT VIC AB 4-0 FS2 27 (SUTURE) ×3 IMPLANT
SUT VICRYL+ 3-0 144IN (SUTURE) ×3 IMPLANT
SUTURE EHLN 3-0 FS-10 30 BLK (SUTURE) ×1 IMPLANT
SWABSTK COMLB BENZOIN TINCTURE (MISCELLANEOUS) ×3 IMPLANT
SYR 10ML LL (SYRINGE) ×3 IMPLANT
SYR BULB IRRIG 60ML STRL (SYRINGE) ×3 IMPLANT
SYR CONTROL 10ML (SYRINGE) ×3 IMPLANT
TAPE TRANSPORE STRL 2 31045 (GAUZE/BANDAGES/DRESSINGS) ×3 IMPLANT
WATER STERILE IRR 1000ML POUR (IV SOLUTION) ×3 IMPLANT

## 2017-07-13 NOTE — Anesthesia Preprocedure Evaluation (Addendum)
Anesthesia Evaluation  Patient identified by MRN, date of birth, ID band Patient awake    Reviewed: Allergy & Precautions, H&P , NPO status , reviewed documented beta blocker date and time   History of Anesthesia Complications (+) PONV and history of anesthetic complications  Airway Mallampati: II  TM Distance: >3 FB Neck ROM: full    Dental  (+) Chipped, Dental Advidsory Given Wide gap upper incisors:   Pulmonary    Pulmonary exam normal        Cardiovascular Normal cardiovascular exam     Neuro/Psych  Headaches, PSYCHIATRIC DISORDERS Anxiety Depression    GI/Hepatic GERD  Medicated and Controlled,  Endo/Other    Renal/GU      Musculoskeletal   Abdominal   Peds  Hematology   Anesthesia Other Findings Past Medical History: No date: Anxiety No date: Complication of anesthesia No date: Depression No date: GERD (gastroesophageal reflux disease) No date: Headache, tension type, episodic     Comment:  Excedrin tension headache helps No date: Migraine     Comment:  H/O MIGRAINES No date: PONV (postoperative nausea and vomiting)     Comment:  WITH HYSTERECTOMY 1992: Suicide attempt ([place])     Comment:  Took overdose of sleeping pills No date: Tremor of both hands Past Surgical History: 2012: ABDOMINAL HYSTERECTOMY 2012: BILATERAL SALPINGOOPHORECTOMY 2009: HEEL SPUR EXCISION; Left BMI    Body Mass Index:  30.95 kg/m     Reproductive/Obstetrics                            Anesthesia Physical Anesthesia Plan  ASA: II  Anesthesia Plan: General LMA   Post-op Pain Management:    Induction: Intravenous  PONV Risk Score and Plan: 4 or greater and Ondansetron, Treatment may vary due to age or medical condition, Midazolam, Dexamethasone, Metaclopromide and Scopolamine patch - Pre-op  Airway Management Planned: LMA  Additional Equipment:   Intra-op Plan:   Post-operative Plan:  Extubation in OR  Informed Consent: I have reviewed the patients History and Physical, chart, labs and discussed the procedure including the risks, benefits and alternatives for the proposed anesthesia with the patient or authorized representative who has indicated his/her understanding and acceptance.   Dental Advisory Given  Plan Discussed with: CRNA  Anesthesia Plan Comments:       Anesthesia Quick Evaluation

## 2017-07-13 NOTE — Anesthesia Post-op Follow-up Note (Signed)
Anesthesia QCDR form completed.        

## 2017-07-13 NOTE — Op Note (Signed)
Preoperative diagnosis: Lipoma left breast, lower outer quadrant.  Postoperative diagnosis: Same.  Operative procedure: Excision left breast lipoma.  Operating Surgeon: Hervey Ard, MD.  Anesthesia: General by LMA, Marcaine 0.5% with 1-200,000 notes of epinephrine, 30 cc.  Estimated blood loss: 5 cc  Clinical note: This 52 year old woman has developed an enlarging mass in the lower outer quadrant of the left breast.  Mammograms show a fatty replaced breast.  Clinical exam is consistent with a lipoma.  She was omitted for elective excision.  Operative note: With the patient under adequate general anesthesia the area was cleansed with ChloraPrep and draped.  Marcaine was infiltrated for postoperative analgesia.  A radial incision from just outside the edge of the areola to just above the level of the inframammary fold was made at the 5 o'clock position.  The skin was incised sharply and remaining dissection completed with electrocautery.  The subcutaneous fat was divided and the well encapsulated lipoma was identified.  This was free  of significant adhesions except to the pectoralis fascia.  The fascia of the muscle was taken with the specimen.  Hemostasis was with a 3-0 Vicryl tie.  The specimen was orientated and sent in formalin for routine histology.  A photograph of the specimen is present in the "media" section of the chart.  The wound was closed in layers with interrupted 2-0 Vicryl figure-of-eight sutures.  The adipose fat was approximated with interrupted 2-0 Vicryl simple sutures.  The skin was closed with a running 4-0 Vicryl subcuticular suture.  Benzoin, Steri-Strips and Telfa applied.  Fluff gauze and compressive wrap applied.  The patient tolerated the procedure well and was taken to the recovery room in stable condition

## 2017-07-13 NOTE — Telephone Encounter (Signed)
[facility]

## 2017-07-13 NOTE — Transfer of Care (Signed)
Immediate Anesthesia Transfer of Care Note  Patient: Cynthia Sellers  Procedure(s) Performed: EXCISION LIPOMA-LEFT BREAST (Left )  Patient Location: PACU  Anesthesia Type:General  Level of Consciousness: sedated  Airway & Oxygen Therapy: Patient Spontanous Breathing and Patient connected to face mask oxygen  Post-op Assessment: Report given to RN and Post -op Vital signs reviewed and stable  Post vital signs: Reviewed and stable  Last Vitals:  Vitals Value Taken Time  BP 103/73 07/13/2017 12:29 PM  Temp    Pulse 72 07/13/2017 12:30 PM  Resp 13 07/13/2017 12:30 PM  SpO2 100 % 07/13/2017 12:30 PM  Vitals shown include unvalidated device data.  Last Pain:  Vitals:   07/13/17 1032  TempSrc: Temporal  PainSc: 0-No pain         Complications: No apparent anesthesia complications

## 2017-07-13 NOTE — Discharge Instructions (Signed)

## 2017-07-13 NOTE — Telephone Encounter (Signed)
Patient had surgery today on 07/13/17 and was giving a prescription for pain medication (hydrocodone) and it was sent to [facility] and they do not fill pain medication. She wants to know if she can get it sent to [facility].

## 2017-07-13 NOTE — H&P (Signed)
No change in clinical exam or history. For excision left breast lipoma.

## 2017-07-13 NOTE — Anesthesia Procedure Notes (Signed)
Procedure Name: LMA Insertion Performed by: Dunbar Buras, CRNA Pre-anesthesia Checklist: Patient identified, Patient being monitored, Timeout performed, Emergency Drugs available and Suction available Patient Re-evaluated:Patient Re-evaluated prior to induction Oxygen Delivery Method: Circle system utilized Preoxygenation: Pre-oxygenation with 100% oxygen Induction Type: IV induction Ventilation: Mask ventilation without difficulty LMA: LMA inserted LMA Size: 3.5 Tube type: Oral Number of attempts: 1 Placement Confirmation: positive ETCO2 and breath sounds checked- equal and bilateral Tube secured with: Tape Dental Injury: Teeth and Oropharynx as per pre-operative assessment        

## 2017-07-14 ENCOUNTER — Telehealth: Payer: Self-pay

## 2017-07-14 LAB — SURGICAL PATHOLOGY

## 2017-07-14 NOTE — Telephone Encounter (Signed)
-----   Message from Robert Bellow, MD sent at 07/14/2017  8:15 AM EDT ----- Patient called on call MD last PM w/ nausea/ headache after breast biopsy. Please call and see how she is dong this AM. Thanks.

## 2017-07-14 NOTE — Anesthesia Postprocedure Evaluation (Signed)
Anesthesia Post Note  Patient: Cynthia Sellers  Procedure(s) Performed: EXCISION LIPOMA-LEFT BREAST (Left )  Patient location during evaluation: PACU Anesthesia Type: General Level of consciousness: awake and alert Pain management: pain level controlled Vital Signs Assessment: post-procedure vital signs reviewed and stable Respiratory status: spontaneous breathing, nonlabored ventilation and respiratory function stable Cardiovascular status: blood pressure returned to baseline and stable Postop Assessment: no apparent nausea or vomiting Anesthetic complications: no     Last Vitals:  Vitals:   07/13/17 1310 07/13/17 1356  BP: (!) 121/94 121/76  Pulse: 72 68  Resp: 14 16  Temp: (!) 36.3 C 36.6 C  SpO2: 98% 98%    Last Pain:  Vitals:   07/13/17 1356  TempSrc: Temporal  PainSc: 3                  Arma Reining Harvie Heck

## 2017-07-14 NOTE — Telephone Encounter (Signed)
Spoke with patient and she says that she had a rough night, but was much better this morning. She did take a Zofran that her mother had and that seemed to help. She thinks it was a reaction to the Hydrocodone. I advised her that she may use Ibuprofen 800mg  every 6 hours as needed or extra strength tylenol two every 6 hours as needed for pain. She may also make use of a heating pad to the area for comfort. She was concerned about repeat episodes of vomiting and would like Phenergan suppositories called into her pharmacy, she would like just 3 as she is self pay. Dr Bary Castilla did approve this and they have been called into her pharmacy. She is aware to call back for any further problems.

## 2017-07-14 NOTE — Anesthesia Postprocedure Evaluation (Signed)
Anesthesia Post Note  Patient: Cynthia Sellers  Procedure(s) Performed: EXCISION LIPOMA-LEFT BREAST (Left )  Patient location during evaluation: PACU Anesthesia Type: General Level of consciousness: awake and alert Pain management: pain level controlled Vital Signs Assessment: post-procedure vital signs reviewed and stable Respiratory status: spontaneous breathing, nonlabored ventilation and respiratory function stable Cardiovascular status: blood pressure returned to baseline and stable Postop Assessment: no apparent nausea or vomiting Anesthetic complications: no     Last Vitals:  Vitals:   07/13/17 1310 07/13/17 1356  BP: (!) 121/94 121/76  Pulse: 72 68  Resp: 14 16  Temp: (!) 36.3 C 36.6 C  SpO2: 98% 98%    Last Pain:  Vitals:   07/13/17 1356  TempSrc: Temporal  PainSc: 3                  Hue Steveson Harvie Heck

## 2017-07-15 ENCOUNTER — Telehealth: Payer: Self-pay | Admitting: General Surgery

## 2017-07-15 NOTE — Telephone Encounter (Signed)
The patient was notified that the pathology showed only mature adipose tissue.  She reports her nausea and vomiting has resolved.  Follow-up as scheduled.

## 2017-07-19 ENCOUNTER — Encounter: Payer: Self-pay | Admitting: *Deleted

## 2017-07-19 ENCOUNTER — Ambulatory Visit (INDEPENDENT_AMBULATORY_CARE_PROVIDER_SITE_OTHER): Payer: Self-pay | Admitting: General Surgery

## 2017-07-19 ENCOUNTER — Encounter: Payer: Self-pay | Admitting: General Surgery

## 2017-07-19 VITALS — BP 126/84 | HR 74 | Resp 12 | Ht 65.0 in | Wt 187.0 lb

## 2017-07-19 DIAGNOSIS — D171 Benign lipomatous neoplasm of skin and subcutaneous tissue of trunk: Secondary | ICD-10-CM

## 2017-07-19 NOTE — Progress Notes (Signed)
Patient with breast lipoma on surgical path.  To follow up in one year with annual mammogram and next pap in 3 years.  HSIS to [facility].

## 2017-07-19 NOTE — Progress Notes (Signed)
Patient ID: Cynthia Sellers, female   DOB: 1965-04-19, 53 y.o.   MRN: [id number]  Chief Complaint  Patient presents with  . Routine Post Op    HPI Cynthia Sellers is a 52 y.o. female.  Here for postoperative visit, left breast lumpectomy for lipoma on 07-13-17.  HPI  Past Medical History:  Diagnosis Date  . Anxiety   . Complication of anesthesia   . Depression   . GERD (gastroesophageal reflux disease)   . Headache, tension type, episodic    Excedrin tension headache helps  . Migraine    H/O MIGRAINES  . PONV (postoperative nausea and vomiting)    WITH HYSTERECTOMY  . Suicide attempt (Cynthia Sellers) 1992   Took overdose of sleeping pills  . Tremor of both hands     Past Surgical History:  Procedure Laterality Date  . ABDOMINAL HYSTERECTOMY  2012  . BILATERAL SALPINGOOPHORECTOMY  2012  . HEEL SPUR EXCISION Left 2009  . LIPOMA EXCISION Left 07/13/2017   Procedure: EXCISION LIPOMA-LEFT BREAST;  Surgeon: Robert Bellow, MD;  Location: [facility] ORS;  Service: General;  Laterality: Left;    Family History  Problem Relation Age of Onset  . Heart disease Father   . Cancer Brother        throat and brain cancer  . Heart disease Brother        deceased from MI  . Arthritis Paternal Grandmother   . Heart disease Paternal Grandmother   . Hypertension Paternal Grandmother   . Heart disease Paternal Grandfather   . Stroke Paternal Grandfather   . Hypertension Paternal Grandfather     Social History Social History   Tobacco Use  . Smoking status: Never Smoker  . Smokeless tobacco: Never Used  Substance Use Topics  . Alcohol use: Never    Frequency: Never  . Drug use: No    No Known Allergies  Current Outpatient Medications  Medication Sig Dispense Refill  . HYDROcodone-acetaminophen (NORCO/VICODIN) 5-325 MG tablet Take 1 tablet by mouth every 4 (four) hours as needed for moderate pain. 12 tablet 0  . Multiple Vitamin (MULTIVITAMIN) capsule Take 1 capsule by mouth  daily.    Marland Kitchen omeprazole (PRILOSEC OTC) 20 MG tablet Take 20 mg by mouth as needed.     No current facility-administered medications for this visit.     Review of Systems Review of Systems  Constitutional: Negative.   Respiratory: Negative.   Cardiovascular: Negative.     Blood pressure 126/84, pulse 74, resp. rate 12, height 5\' 5"  (1.651 m), weight 187 lb (84.8 kg).  Physical Exam Physical Exam  Constitutional: She is oriented to person, place, and time. She appears well-developed and well-nourished.  Pulmonary/Chest:  Left breast incision clean    Neurological: She is alert and oriented to person, place, and time.  Skin: Skin is warm and dry.  Psychiatric: Her behavior is normal.    Data Reviewed DIAGNOSIS:  A. LIPOMA, LEFT BREAST; EXCISION:  - MATURE ADIPOSE TISSUE, CONSISTENT WITH LIPOMA.   Assessment    Doing well status post excision of left lower outer quadrant lipoma.    Plan        Follow up as needed.   HPI, Physical Exam, Assessment and Plan have been scribed under the direction and in the presence of Robert Bellow, MD. Karie Fetch, RN  I have completed the exam and reviewed the above documentation for accuracy and completeness.  I agree with the above.  Haematologist has been  used and any errors in dictation or transcription are unintentional.  Hervey Ard, M.D., F.A.C.S.   Forest Gleason Liliauna Santoni 07/19/2017, 8:35 PM

## 2017-07-19 NOTE — Patient Instructions (Signed)
The patient is aware to call back for any questions or concerns.  

## 2019-09-11 ENCOUNTER — Encounter: Payer: Self-pay | Admitting: Family Medicine

## 2019-09-11 ENCOUNTER — Ambulatory Visit (INDEPENDENT_AMBULATORY_CARE_PROVIDER_SITE_OTHER): Payer: Self-pay | Admitting: Family Medicine

## 2019-09-11 ENCOUNTER — Other Ambulatory Visit: Payer: Self-pay

## 2019-09-11 VITALS — BP 127/88 | HR 112 | Temp 98.0°F | Resp 16 | Ht 64.0 in | Wt 198.8 lb

## 2019-09-11 DIAGNOSIS — R030 Elevated blood-pressure reading, without diagnosis of hypertension: Secondary | ICD-10-CM | POA: Insufficient documentation

## 2019-09-11 DIAGNOSIS — G250 Essential tremor: Secondary | ICD-10-CM | POA: Insufficient documentation

## 2019-09-11 DIAGNOSIS — F411 Generalized anxiety disorder: Secondary | ICD-10-CM | POA: Insufficient documentation

## 2019-09-11 DIAGNOSIS — F329 Major depressive disorder, single episode, unspecified: Secondary | ICD-10-CM | POA: Insufficient documentation

## 2019-09-11 DIAGNOSIS — Z1231 Encounter for screening mammogram for malignant neoplasm of breast: Secondary | ICD-10-CM

## 2019-09-11 DIAGNOSIS — Z7689 Persons encountering health services in other specified circumstances: Secondary | ICD-10-CM

## 2019-09-11 DIAGNOSIS — F330 Major depressive disorder, recurrent, mild: Secondary | ICD-10-CM | POA: Insufficient documentation

## 2019-09-11 DIAGNOSIS — E66811 Obesity, class 1: Secondary | ICD-10-CM

## 2019-09-11 DIAGNOSIS — E669 Obesity, unspecified: Secondary | ICD-10-CM

## 2019-09-11 DIAGNOSIS — J3089 Other allergic rhinitis: Secondary | ICD-10-CM

## 2019-09-11 DIAGNOSIS — N3941 Urge incontinence: Secondary | ICD-10-CM | POA: Insufficient documentation

## 2019-09-11 DIAGNOSIS — F419 Anxiety disorder, unspecified: Secondary | ICD-10-CM | POA: Insufficient documentation

## 2019-09-11 HISTORY — DX: Generalized anxiety disorder: F41.1

## 2019-09-11 HISTORY — DX: Major depressive disorder, single episode, unspecified: F32.9

## 2019-09-11 HISTORY — DX: Essential tremor: G25.0

## 2019-09-11 HISTORY — DX: Obesity, unspecified: E66.9

## 2019-09-11 HISTORY — DX: Urge incontinence: N39.41

## 2019-09-11 HISTORY — DX: Obesity, class 1: E66.811

## 2019-09-11 MED ORDER — PRIMIDONE 50 MG PO TABS: 50.0000 mg | ORAL_TABLET | Freq: Every day | ORAL | 0 refills | Status: DC

## 2019-09-11 NOTE — Assessment & Plan Note (Signed)
Currently mild problem, recurrent  Related to underlying tremor and other health problems. Secondary anxiety

## 2019-09-11 NOTE — Progress Notes (Signed)
Subjective:    Patient ID: Cynthia Sellers, female    DOB: 1965/11/06, 54 y.o.   MRN: [id number]  Cynthia Sellers is a 54 y.o. female presenting on 09/11/2019 for Establish Care (Runny nose, cough, sneezing, itchy throat but not sore as per patient onset 3 days denies fever or SOB )  Here today with Katharine Look, non biological mother.  She has not had health insurance for past few years. Now has family planning medicaid, she is working on getting this changed.  HPI   Tremors, chronic Reports chronic familial problem, multiple family members on father's side have the same problem. Describes shaking tremor of both hands and arm and head and most of body often when active or performing task, describes intention tremors, has some baseline resting tremor as well. Never diagnosed Previously seen at health department / [facility] years ago, was given Inderal and it did not help. She is unsure of any other treatments or diagnosis. Last seen in May 2021 for disability assessment due to tremor and she is on medical/physical disability for this problem.  Anxiety / Major Depression, recurrent mild See above tremor problem, causes her anxiety and mood symptoms, previously diagnosed depression. Not on medication to treat this. She is anxious and stressed with her son who lives at home and he abuses alcohol  Urinary Incontinence History of bladder tack surgery, s/p hysterectomy She has reduced control of urine, has urge incontinence.  Elevated BP without HTN Reports never checked BP at home, usually at doctors office would have elevated. Current Meds - none   Denies CP, dyspnea, HA, edema, dizziness / lightheadedness  GERD Reports chronic heartburn, she takes Prilosec 20mg  daily OTC  Allergic Rhinosinusitis Additional finding today with reported cough, congestion, drainage Taking Allergy medicine, Allegra Denies any fever   Health Maintenance: Up to date on COVID19 vaccine, [place],  reported, 04/2019, does not have any   Last mammogram 06/06/17 - negative. Due for repeat. History of lipoma removed from L breast, benign.  S/p hysterectomy 2012-13. Followed by Daneil Dan. Last pap smear 2019, 06/2017 with ASCUS   Depression screen PHQ 2/9 09/11/2019  Decreased Interest 1  Down, Depressed, Hopeless 2  PHQ - 2 Score 3  Altered sleeping 3  Tired, decreased energy 3  Change in appetite 3  Feeling bad or failure about yourself  2  Trouble concentrating 0  Moving slowly or fidgety/restless 0  Suicidal thoughts 0  PHQ-9 Score 14  Difficult doing work/chores Somewhat difficult     GAD 7 : Generalized Anxiety Score 09/11/2019  Nervous, Anxious, on Edge 3  Control/stop worrying 3  Worry too much - different things 3  Trouble relaxing 3  Restless 0  Easily annoyed or irritable 1  Afraid - awful might happen 0  Total GAD 7 Score 13  Anxiety Difficulty Somewhat difficult      Past Medical History:  Diagnosis Date  . Anxiety   . Complication of anesthesia   . Depression   . GERD (gastroesophageal reflux disease)   . Headache, tension type, episodic    Excedrin tension headache helps  . Migraine    H/O MIGRAINES  . PONV (postoperative nausea and vomiting)    WITH HYSTERECTOMY  . Suicide attempt ([place]) 1992   Took overdose of sleeping pills  . Tremor of both hands    Past Surgical History:  Procedure Laterality Date  . ABDOMINAL HYSTERECTOMY  2012  . BILATERAL SALPINGOOPHORECTOMY  2012  . HEEL SPUR EXCISION  Left 2009  . LIPOMA EXCISION Left 07/13/2017   Procedure: EXCISION LIPOMA-LEFT BREAST;  Surgeon: Robert Bellow, MD;  Location: [facility] ORS;  Service: General;  Laterality: Left;   Social History   Socioeconomic History  . Marital status: Widowed    Spouse name: Not on file  . Number of children: 2  . Years of education: 10  . Highest education level: Not on file  Occupational History    Employer: [employer] [place] [employer] [place]  Tobacco Use  . Smoking  status: Never Smoker  . Smokeless tobacco: Never Used  Vaping Use  . Vaping Use: Never used  Substance and Sexual Activity  . Alcohol use: Never  . Drug use: Never  . Sexual activity: Yes    Birth control/protection: Surgical  Other Topics Concern  . Not on file  Social History Narrative  . Not on file   Social Determinants of Health   Financial Resource Strain:   . Difficulty of Paying Living Expenses: Not on file  Food Insecurity:   . Worried About Charity fundraiser in the Last Year: Not on file  . Ran Out of Food in the Last Year: Not on file  Transportation Needs:   . Lack of Transportation (Medical): Not on file  . Lack of Transportation (Non-Medical): Not on file  Physical Activity:   . Days of Exercise per Week: Not on file  . Minutes of Exercise per Session: Not on file  Stress:   . Feeling of Stress : Not on file  Social Connections:   . Frequency of Communication with Friends and Family: Not on file  . Frequency of Social Gatherings with Friends and Family: Not on file  . Attends Religious Services: Not on file  . Active Member of Clubs or Organizations: Not on file  . Attends Archivist Meetings: Not on file  . Marital Status: Not on file  Intimate Partner Violence:   . Fear of Current or Ex-Partner: Not on file  . Emotionally Abused: Not on file  . Physically Abused: Not on file  . Sexually Abused: Not on file   Family History  Problem Relation Age of Onset  . Heart disease Father 51  . Tremor Father   . Cancer Brother        throat and brain cancer  . Heart disease Brother        deceased from MI  . Tremor Brother   . Arthritis Paternal Grandmother   . Heart disease Paternal Grandmother   . Hypertension Paternal Grandmother   . Heart disease Paternal Grandfather   . Stroke Paternal Grandfather   . Hypertension Paternal Grandfather    Current Outpatient Medications on File Prior to Visit  Medication Sig  . Multiple Vitamin  (MULTIVITAMIN) capsule Take 1 capsule by mouth daily.  Marland Kitchen omeprazole (PRILOSEC OTC) 20 MG tablet Take 20 mg by mouth as needed.   No current facility-administered medications on file prior to visit.    Review of Systems Per HPI unless specifically indicated above     Objective:    BP 127/88   Pulse (!) 112   Temp 98 F (36.7 C) (Temporal)   Resp 16   Ht 5\' 4"  (1.626 m)   Wt 198 lb 12.8 oz (90.2 kg)   SpO2 97%   BMI 34.12 kg/m   Wt Readings from Last 3 Encounters:  09/11/19 198 lb 12.8 oz (90.2 kg)  07/19/17 187 lb (84.8 kg)  07/13/17 186 lb (  84.4 kg)    Physical Exam Vitals and nursing note reviewed.  Constitutional:      General: She is not in acute distress.    Appearance: She is well-developed. She is not diaphoretic.     Comments: Well-appearing, comfortable, cooperative  HENT:     Head: Normocephalic and atraumatic.  Eyes:     General:        Right eye: No discharge.        Left eye: No discharge.     Conjunctiva/sclera: Conjunctivae normal.  Neck:     Thyroid: No thyromegaly.  Cardiovascular:     Rate and Rhythm: Normal rate and regular rhythm.     Heart sounds: Normal heart sounds. No murmur heard.   Pulmonary:     Effort: Pulmonary effort is normal. No respiratory distress.     Breath sounds: Normal breath sounds. No wheezing or rales.  Musculoskeletal:        General: Normal range of motion.     Cervical back: Normal range of motion and neck supple.  Lymphadenopathy:     Cervical: No cervical adenopathy.  Skin:    General: Skin is warm and dry.     Findings: No erythema or rash.  Neurological:     General: No focal deficit present.     Mental Status: She is alert and oriented to person, place, and time. Mental status is at baseline.     Sensory: No sensory deficit.     Comments: Generalized upper extremity bilateral tremors worse in hands, worse with intention and movements, has some mild generalized fine tremor of head / neck and upper body as  well.  Psychiatric:        Behavior: Behavior normal.     Comments: Well groomed, good eye contact, normal speech and thoughts       Results for orders placed or performed during the hospital encounter of 07/13/17  Surgical pathology  Result Value Ref Range   SURGICAL PATHOLOGY      Surgical Pathology CASE: [id number] PATIENT: Carmine Savoy Surgical Pathology Report     SPECIMEN SUBMITTED: A. Lipoma, left breast  CLINICAL HISTORY: None provided  PRE-OPERATIVE DIAGNOSIS: Lipoma left breast  POST-OPERATIVE DIAGNOSIS: Same as pre-op     DIAGNOSIS: A.  LIPOMA, LEFT BREAST; EXCISION: - MATURE ADIPOSE TISSUE, CONSISTENT WITH LIPOMA.   GROSS DESCRIPTION: A. Labeled: Left breast lipoma Received: In formalin Tissue fragment(s): 1 Size: 6.5 (medial-lateral) by 6.1 (superior-inferior) by 4.2 (anterior-posterior centimeter cm Description: Received is an ovoid fragment of yellow lobulated fibrofatty tissue with cranial, skin and deep metallic markers. Inking scheme: Superior = blue Inferior = green Medial = yellow Lateral = orange Posterior = black Anterior/Superficial = red  The specimen is sectioned to reveal yellow lobulated fibrofatty tissue without necrosis Representative of all margins with mass submitted in three casse ttes.  Specimen collected and placed in formalin at 12:10 PM on 07/13/2017. Total formalin fixation time 8.5 hours  Final Diagnosis performed by Delorse Lek, MD.   Electronically signed 07/14/2017 4:09:08PM The electronic signature indicates that the named Attending Pathologist has evaluated the specimen  Technical component performed at [facility], [address], [place], [place] [address] Lab: [id number] Dir: Rush Farmer, MD, MMM  Professional component performed at [facility], [facility], [place], [place], [place] [address] Lab: [phone] Dir: Dellia Nims. Reuel Derby, MD       Assessment & Plan:   Problem  List Items Addressed This Visit    Urge urinary  incontinence    Chronic problem. Goal to refer to Urology when able based on insurance change      Obesity (BMI 30.0-34.9)   Familial tremor - Primary    Uncertain exact etiology Chronic familial problem since childhood No prior clear diagnosis, has been evaluated in past before, trial on Inderal (propranolol) limited results, also has had disability assessment approved in 05/2019.  Trial on Primidone 50mg  nightly, titrate dose up by 50mg  weekly until max 250mg  if needed, notify us if need new rx, sent 60 pills to start. Offer referral to Neurology (likely [facility] locally) but she needs to update her Medicaid insurance first before referral. She can notify us sooner if need.      Relevant Medications   primidone (MYSOLINE) 50 MG tablet   Elevated BP without diagnosis of hypertension    Near normal BP today Prior history elevated BP Not on medication      Depression, major, recurrent, mild (HCC)    Currently mild problem, recurrent  Related to underlying tremor and other health problems. Secondary anxiety      Anxiety    Chronic problem May have be related to underlying tremor / anxiety/mood No prior treatments, consider future Buspar mentioned by family       Other Visit Diagnoses    Encounter to establish care with new doctor       Encounter for screening mammogram for malignant neoplasm of breast       Relevant Orders   MM 3D SCREEN BREAST BILATERAL   Seasonal allergic rhinitis due to other allergic trigger          Review outside records  Meds ordered this encounter  Medications  . primidone (MYSOLINE) 50 MG tablet    Sig: Take 1 tablet (50 mg total) by mouth at bedtime. After 1 week, increase to 2 pills (100mg ) nightly, may gradually increase by 1 extra pill every week, max is 5 pills (250mg ) nightly.    Dispense:  60 tablet    Refill:  0      Follow up plan: Return in about 4 weeks (around 10/09/2019) for  anxiety, tremors, update on ins? refer neuro?Marland Kitchen  Nobie Putnam, [facility] 09/11/2019, 3:26 PM

## 2019-09-11 NOTE — Assessment & Plan Note (Signed)
Chronic problem. Goal to refer to Urology when able based on insurance change

## 2019-09-11 NOTE — Assessment & Plan Note (Signed)
Uncertain exact etiology Chronic familial problem since childhood No prior clear diagnosis, has been evaluated in past before, trial on Inderal (propranolol) limited results, also has had disability assessment approved in 05/2019.  Trial on Primidone 50mg  nightly, titrate dose up by 50mg  weekly until max 250mg  if needed, notify us if need new rx, sent 60 pills to start. Offer referral to Neurology (likely [facility] locally) but she needs to update her Medicaid insurance first before referral. She can notify us sooner if need.

## 2019-09-11 NOTE — Assessment & Plan Note (Addendum)
Chronic problem May have be related to underlying tremor / anxiety/mood No prior treatments, consider future Buspar mentioned by family

## 2019-09-11 NOTE — Patient Instructions (Addendum)
Thank you for coming to the office today.  Please call us back to notify us with the dates you received last 2 COVID vaccines for [place] - leave message.  START Primidone 50mg  nightly before bed, every 1 week may increase by 1 additional pill, up to max of 5 pills at bed, let me know if running low,  We can order more, if we know what dose you take  We can talk again in 4 weeks for anxiety and discuss buspar or other med.  For Mammogram screening for breast cancer   Call the [facility] below anytime to schedule your own appointment now that order has been placed.  [facility] [place], [place] [address] Phone: [phone]    Please schedule a Follow-up Appointment to: Return in about 4 weeks (around 10/09/2019) for anxiety, tremors, update on ins? refer neuro?.  If you have any other questions or concerns, please feel free to call the office or send a message through [place]. You may also schedule an earlier appointment if necessary.  Additionally, you may be receiving a survey about your experience at our office within a few days to 1 week by e-mail or mail. We value your feedback.  Nobie Putnam, DO [place]

## 2019-09-11 NOTE — Assessment & Plan Note (Signed)
Near normal BP today Prior history elevated BP Not on medication

## 2019-10-08 ENCOUNTER — Ambulatory Visit: Payer: Medicaid Other | Admitting: Family Medicine

## 2020-02-10 ENCOUNTER — Emergency Department
Admission: EM | Admit: 2020-02-10 | Discharge: 2020-02-10 | Disposition: A | Discharge status: Home / Self Care | Payer: Self-pay | Attending: Emergency Medicine | Admitting: Emergency Medicine

## 2020-02-10 ENCOUNTER — Encounter: Payer: Self-pay | Admitting: Emergency Medicine

## 2020-02-10 ENCOUNTER — Emergency Department: Payer: Self-pay

## 2020-02-10 ENCOUNTER — Other Ambulatory Visit: Payer: Self-pay

## 2020-02-10 DIAGNOSIS — S161XXA Strain of muscle, fascia and tendon at neck level, initial encounter: Secondary | ICD-10-CM | POA: Diagnosis not present

## 2020-02-10 DIAGNOSIS — S39012A Strain of muscle, fascia and tendon of lower back, initial encounter: Secondary | ICD-10-CM | POA: Insufficient documentation

## 2020-02-10 DIAGNOSIS — Y92410 Unspecified street and highway as the place of occurrence of the external cause: Secondary | ICD-10-CM | POA: Diagnosis not present

## 2020-02-10 DIAGNOSIS — T1490XA Injury, unspecified, initial encounter: Secondary | ICD-10-CM

## 2020-02-10 DIAGNOSIS — S3992XA Unspecified injury of lower back, initial encounter: Secondary | ICD-10-CM | POA: Diagnosis present

## 2020-02-10 DIAGNOSIS — S0990XA Unspecified injury of head, initial encounter: Secondary | ICD-10-CM | POA: Diagnosis not present

## 2020-02-10 LAB — CBC WITH DIFFERENTIAL/PLATELET
Abs Immature Granulocytes: 0.02 10*3/uL (ref 0.00–0.07)
Basophils Absolute: 0 10*3/uL (ref 0.0–0.1)
Basophils Relative: 1 %
Eosinophils Absolute: 0.1 10*3/uL (ref 0.0–0.5)
Eosinophils Relative: 1 %
HCT: 38.6 % (ref 36.0–46.0)
Hemoglobin: 13.4 g/dL (ref 12.0–15.0)
Immature Granulocytes: 0 %
Lymphocytes Relative: 27 %
Lymphs Abs: 1.8 10*3/uL (ref 0.7–4.0)
MCH: 31 pg (ref 26.0–34.0)
MCHC: 34.7 g/dL (ref 30.0–36.0)
MCV: 89.4 fL (ref 80.0–100.0)
Monocytes Absolute: 0.4 10*3/uL (ref 0.1–1.0)
Monocytes Relative: 6 %
Neutro Abs: 4.3 10*3/uL (ref 1.7–7.7)
Neutrophils Relative %: 65 %
Platelets: 227 10*3/uL (ref 150–400)
RBC: 4.32 MIL/uL (ref 3.87–5.11)
RDW: 11.9 % (ref 11.5–15.5)
WBC: 6.7 10*3/uL (ref 4.0–10.5)
nRBC: 0 % (ref 0.0–0.2)

## 2020-02-10 LAB — COMPREHENSIVE METABOLIC PANEL
ALT: 11 U/L (ref 0–44)
AST: 17 U/L (ref 15–41)
Albumin: 3.9 g/dL (ref 3.5–5.0)
Alkaline Phosphatase: 72 U/L (ref 38–126)
Anion gap: 9 (ref 5–15)
BUN: 14 mg/dL (ref 6–20)
CO2: 21 mmol/L — ABNORMAL LOW (ref 22–32)
Calcium: 8.7 mg/dL — ABNORMAL LOW (ref 8.9–10.3)
Chloride: 109 mmol/L (ref 98–111)
Creatinine, Ser: 0.56 mg/dL (ref 0.44–1.00)
GFR, Estimated: >60 mL/min (ref >60)
Glucose, Bld: 91 mg/dL (ref 70–99)
Potassium: 3.7 mmol/L (ref 3.5–5.1)
Sodium: 139 mmol/L (ref 135–145)
Total Bilirubin: 0.6 mg/dL (ref 0.3–1.2)
Total Protein: 6.9 g/dL (ref 6.5–8.1)

## 2020-02-10 MED ORDER — MORPHINE SULFATE (PF) 4 MG/ML IV SOLN
4.0000 mg | Freq: Once | INTRAVENOUS | Status: AC
  Administered 2020-02-10: 4 mg via INTRAVENOUS
  Filled 2020-02-10: qty 1

## 2020-02-10 MED ORDER — OXYCODONE-ACETAMINOPHEN 5-325 MG PO TABS
1.0000 | ORAL_TABLET | Freq: Once | ORAL | Status: AC
  Administered 2020-02-10: 1 via ORAL
  Filled 2020-02-10: qty 1

## 2020-02-10 MED ORDER — MELOXICAM 15 MG PO TABS: 15.0000 mg | ORAL_TABLET | Freq: Every day | ORAL | 2 refills | Status: DC

## 2020-02-10 MED ORDER — IOHEXOL 300 MG/ML  SOLN
100.0000 mL | Freq: Once | INTRAMUSCULAR | Status: AC | PRN
  Administered 2020-02-10: 100 mL via INTRAVENOUS
  Filled 2020-02-10: qty 100

## 2020-02-10 MED ORDER — OXYCODONE-ACETAMINOPHEN 5-325 MG PO TABS: 1.0000 | ORAL_TABLET | ORAL | 0 refills | Status: DC | PRN

## 2020-02-10 MED ORDER — BACLOFEN 10 MG PO TABS: 10.0000 mg | ORAL_TABLET | Freq: Three times a day (TID) | ORAL | 1 refills | Status: AC

## 2020-02-10 MED ORDER — ONDANSETRON HCL 4 MG/2ML IJ SOLN
4.0000 mg | Freq: Once | INTRAMUSCULAR | Status: AC
  Administered 2020-02-10: 4 mg via INTRAVENOUS
  Filled 2020-02-10: qty 2

## 2020-02-10 NOTE — ED Notes (Signed)
Pt verbalized understanding of d/c instructions at this time. Pt denied further questions at this time. Pt assisted to lobby in wheelchair to wait for ride. Ena Dawley RN in triage made aware.
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
## 2020-02-10 NOTE — Discharge Instructions (Signed)
Follow-up with orthopedics if your neck and back are not improving in 1 week. Once you obtain a regular doctor, they will need to monitor the hemangioma noted on your CT scan.  This is harmless at this time. Take the medication as needed for pain and muscle strain You may also take over-the-counter Tylenol and ibuprofen. Return to the emergency department if you are worsening
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
## 2020-06-30 ENCOUNTER — Telehealth: Payer: Self-pay

## 2020-06-30 NOTE — Telephone Encounter (Signed)
Called pt to r/s 6/28 appt no answer left vm

## 2020-07-01 ENCOUNTER — Ambulatory Visit: Payer: Medicaid Other | Admitting: Internal Medicine

## 2020-07-02 NOTE — Progress Notes (Signed)
BP 133/87   Pulse 79   Temp (!) 97.2 F (36.2 C) (Oral)   Ht 5' 2.7" (1.593 m)   Wt 190 lb 9.6 oz (86.5 kg)   SpO2 97%   BMI 34.09 kg/m    Subjective:    Patient ID: Cynthia Sellers, female    DOB: 1965/03/01, 55 y.o.   MRN: [id number]  HPI: Cynthia Sellers is a 55 y.o. female  Chief Complaint  Patient presents with   Anxiety    Pt states she is here to discuss her anxiety. States her son died 3 months ago and she is having a lot of trouble since then     Patient here to establish care.  She was previously seen at [place].  ANXIETY/STRESS Patient states she is dealing with anxiety since losing her son about 3 months ago.  Patient states she is not sleeping well. Duration:uncontrolled Anxious mood: yes  Excessive worrying: yes Irritability: no  Sweating: yes Nausea: yes Palpitations:no Hyperventilation: no Panic attacks: no Agoraphobia: no  Obscessions/compulsions: no Depressed mood: yes Depression screen [facility] 2/9 07/03/2020 09/11/2019  Decreased Interest 3 1  Down, Depressed, Hopeless 1 2  PHQ - 2 Score 4 3  Altered sleeping 3 3  Tired, decreased energy 0 3  Change in appetite 1 3  Feeling bad or failure about yourself  0 2  Trouble concentrating 0 0  Moving slowly or fidgety/restless 0 0  Suicidal thoughts 0 0  PHQ-9 Score 8 14  Difficult doing work/chores Not difficult at all Somewhat difficult   Anhedonia: no Weight changes:  lost 10lbs. Insomnia: yes  difficulty falling asleep and staying asleep   Hypersomnia: no Fatigue/loss of energy: yes Feelings of worthlessness: no Feelings of guilt: no Impaired concentration/indecisiveness: no Suicidal ideations: no  Crying spells: yes Recent Stressors/Life Changes: yes   Relationship problems: no   Family stress: no     Financial stress: no    Job stress: no    Recent death/loss: yes  [facility] Visit from 07/03/2020 in [place]  PHQ-9 Total Score 8        Relevant past medical, surgical, family and social history reviewed and updated as indicated. Interim medical history since our last visit reviewed. Allergies and medications reviewed and updated.  Review of Systems  Psychiatric/Behavioral:  Positive for dysphoric mood and sleep disturbance. Negative for suicidal ideas. The patient is nervous/anxious.    Per HPI unless specifically indicated above     Objective:    BP 133/87   Pulse 79   Temp (!) 97.2 F (36.2 C) (Oral)   Ht 5' 2.7" (1.593 m)   Wt 190 lb 9.6 oz (86.5 kg)   SpO2 97%   BMI 34.09 kg/m   Wt Readings from Last 3 Encounters:  07/03/20 190 lb 9.6 oz (86.5 kg)  02/10/20 197 lb (89.4 kg)  09/11/19 198 lb 12.8 oz (90.2 kg)    Physical Exam  Results for orders placed or performed during the hospital encounter of 02/10/20  Comprehensive metabolic panel  Result Value Ref Range   Sodium 139 135 - 145 mmol/L   Potassium 3.7 3.5 - 5.1 mmol/L   Chloride 109 98 - 111 mmol/L   CO2 21 (L) 22 - 32 mmol/L   Glucose, Bld 91 70 - 99 mg/dL   BUN 14 6 - 20 mg/dL   Creatinine, Ser 0.56 0.44 - 1.00 mg/dL   Calcium 8.7 (L) 8.9 - 10.3 mg/dL  Total Protein 6.9 6.5 - 8.1 g/dL   Albumin 3.9 3.5 - 5.0 g/dL   AST 17 15 - 41 U/L   ALT 11 0 - 44 U/L   Alkaline Phosphatase 72 38 - 126 U/L   Total Bilirubin 0.6 0.3 - 1.2 mg/dL   GFR, Estimated >60 >60 mL/min   Anion gap 9 5 - 15  CBC with Differential  Result Value Ref Range   WBC 6.7 4.0 - 10.5 K/uL   RBC 4.32 3.87 - 5.11 MIL/uL   Hemoglobin 13.4 12.0 - 15.0 g/dL   HCT 38.6 36.0 - 46.0 %   MCV 89.4 80.0 - 100.0 fL   MCH 31.0 26.0 - 34.0 pg   MCHC 34.7 30.0 - 36.0 g/dL   RDW 11.9 11.5 - 15.5 %   Platelets 227 150 - 400 K/uL   nRBC 0.0 0.0 - 0.2 %   Neutrophils Relative % 65 %   Neutro Abs 4.3 1.7 - 7.7 K/uL   Lymphocytes Relative 27 %   Lymphs Abs 1.8 0.7 - 4.0 K/uL   Monocytes Relative 6 %   Monocytes Absolute 0.4 0.1 - 1.0 K/uL   Eosinophils Relative 1 %    Eosinophils Absolute 0.1 0.0 - 0.5 K/uL   Basophils Relative 1 %   Basophils Absolute 0.0 0.0 - 0.1 K/uL   Immature Granulocytes 0 %   Abs Immature Granulocytes 0.02 0.00 - 0.07 K/uL      Assessment & Plan:   Problem List Items Addressed This Visit       Other   Anxiety    Chronic. Uncontrolled.  Patient agrees to start Effexor 37.5mg  daily.  Side effects and benefits discussed with patient during visit today. Return in 2 weeks for reevaluation.        Relevant Medications   venlafaxine XR (EFFEXOR XR) 37.5 MG 24 hr capsule   traZODone (DESYREL) 50 MG tablet   Depression, major, recurrent, mild (HCC) - Primary    Chronic. Uncontrolled.  Patient agrees to start Effexor 37.5mg  daily.  Side effects and benefits discussed with patient during visit today. Return in 2 weeks for reevaluation.        Relevant Medications   venlafaxine XR (EFFEXOR XR) 37.5 MG 24 hr capsule   traZODone (DESYREL) 50 MG tablet   Other Visit Diagnoses     Encounter to establish care       Encounter for screening mammogram for malignant neoplasm of breast       Relevant Orders   MM Digital Screening        Follow up plan: Return in about 2 weeks (around 07/17/2020) for Depression/Anxiety FU.

## 2020-07-03 ENCOUNTER — Encounter: Payer: Self-pay | Admitting: Nurse Practitioner

## 2020-07-03 ENCOUNTER — Ambulatory Visit: Payer: 59 | Admitting: Nurse Practitioner

## 2020-07-03 ENCOUNTER — Other Ambulatory Visit: Payer: Self-pay

## 2020-07-03 VITALS — BP 133/87 | HR 79 | Temp 97.2°F | Ht 62.7 in | Wt 190.6 lb

## 2020-07-03 DIAGNOSIS — Z1231 Encounter for screening mammogram for malignant neoplasm of breast: Secondary | ICD-10-CM

## 2020-07-03 DIAGNOSIS — F419 Anxiety disorder, unspecified: Secondary | ICD-10-CM

## 2020-07-03 DIAGNOSIS — F330 Major depressive disorder, recurrent, mild: Secondary | ICD-10-CM | POA: Diagnosis not present

## 2020-07-03 MED ORDER — TRAZODONE HCL 50 MG PO TABS: 50.0000 mg | ORAL_TABLET | Freq: Every evening | ORAL | 0 refills | Status: DC | PRN

## 2020-07-03 MED ORDER — VENLAFAXINE HCL ER 37.5 MG PO CP24: 37.5000 mg | ORAL_CAPSULE | Freq: Every day | ORAL | 0 refills | Status: DC

## 2020-07-03 NOTE — Assessment & Plan Note (Signed)
Chronic. Uncontrolled.  Patient agrees to start Effexor 37.5mg  daily.  Side effects and benefits discussed with patient during visit today. Return in 2 weeks for reevaluation.

## 2020-07-17 ENCOUNTER — Other Ambulatory Visit: Payer: Self-pay | Admitting: Nurse Practitioner

## 2020-07-18 ENCOUNTER — Other Ambulatory Visit: Payer: Self-pay | Admitting: Nurse Practitioner

## 2020-07-18 NOTE — Telephone Encounter (Signed)
   Notes to clinic:  Patient has appointment on 7/25/20222 Last filled 07/03/2020 for 30 day    Requested Prescriptions  Pending Prescriptions Disp Refills   traZODone (DESYREL) 50 MG tablet [Pharmacy Med Name: traZODone HCl 50 MG Oral Tablet] 30 tablet 0    Sig: TAKE 1 TABLET BY MOUTH AT BEDTIME AS NEEDED FOR SLEEP. 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

## 2020-07-18 NOTE — Telephone Encounter (Signed)
Possible duplicate pt scheduled 2/82

## 2020-07-28 ENCOUNTER — Other Ambulatory Visit: Payer: Self-pay

## 2020-07-28 ENCOUNTER — Encounter: Payer: Self-pay | Admitting: Nurse Practitioner

## 2020-07-28 ENCOUNTER — Ambulatory Visit (INDEPENDENT_AMBULATORY_CARE_PROVIDER_SITE_OTHER): Payer: 59 | Admitting: Nurse Practitioner

## 2020-07-28 VITALS — BP 133/82 | HR 81 | Temp 98.5°F | Wt 190.0 lb

## 2020-07-28 DIAGNOSIS — F418 Other specified anxiety disorders: Secondary | ICD-10-CM | POA: Diagnosis not present

## 2020-07-28 DIAGNOSIS — F330 Major depressive disorder, recurrent, mild: Secondary | ICD-10-CM

## 2020-07-28 MED ORDER — VENLAFAXINE HCL ER 75 MG PO CP24: 75.0000 mg | ORAL_CAPSULE | Freq: Every day | ORAL | 0 refills | Status: DC

## 2020-07-28 MED ORDER — TRAZODONE HCL 100 MG PO TABS: 100.0000 mg | ORAL_TABLET | Freq: Every evening | ORAL | 0 refills | Status: DC | PRN

## 2020-07-28 NOTE — Assessment & Plan Note (Signed)
Chronic. Ongoing. Slightly improved from prior visit. Will increase Effexor to '75mg'$  daily.  Will continue with Trazodone nightly for sleep. Follow up in 1 month for reevaluation.

## 2020-07-28 NOTE — Progress Notes (Signed)
BP 133/82   Pulse 81   Temp 98.5 F (36.9 C)   Wt 190 lb (86.2 kg)   SpO2 99%   BMI 33.98 kg/m    Subjective:    Patient ID: Cynthia Sellers, female    DOB: 28-Jun-1965, 55 y.o.   MRN: [id number]  HPI: Cynthia Sellers is a 55 y.o. female  Chief Complaint  Patient presents with   Anxiety   Depression   ANXIETY/DEPRESSION Patient states that her symptoms have improved some but she is still experiencing a lot of depression.  She feels like she would benefit from an increased dose of Effexor. She is sleeping much better when taking 2 tabs of trazodone. Denies SI.   GAD 7 : Generalized Anxiety Score 07/28/2020 07/03/2020 09/11/2019  Nervous, Anxious, on Edge '3 3 3  '$ Control/stop worrying '3 3 3  '$ Worry too much - different things '3 3 3  '$ Trouble relaxing 0 0 3  Restless 0 0 0  Easily annoyed or irritable '2 1 1  '$ Afraid - awful might happen 0 0 0  Total GAD 7 Score '11 10 13  '$ Anxiety Difficulty Somewhat difficult Not difficult at all Somewhat difficult    [place] Office Visit from 07/28/2020 in [place]  PHQ-9 Total Score 12          Relevant past medical, surgical, family and social history reviewed and updated as indicated. Interim medical history since our last visit reviewed. Allergies and medications reviewed and updated.  Review of Systems  Psychiatric/Behavioral:  Positive for dysphoric mood. Negative for sleep disturbance and suicidal ideas. The patient is nervous/anxious.    Per HPI unless specifically indicated above     Objective:    BP 133/82   Pulse 81   Temp 98.5 F (36.9 C)   Wt 190 lb (86.2 kg)   SpO2 99%   BMI 33.98 kg/m   Wt Readings from Last 3 Encounters:  07/28/20 190 lb (86.2 kg)  07/03/20 190 lb 9.6 oz (86.5 kg)  02/10/20 197 lb (89.4 kg)    Physical Exam Vitals and nursing note reviewed.  Constitutional:      General: She is not in acute distress.    Appearance: Normal appearance. She is normal weight. She  is not ill-appearing, toxic-appearing or diaphoretic.  HENT:     Head: Normocephalic.     Right Ear: External ear normal.     Left Ear: External ear normal.     Nose: Nose normal.     Mouth/Throat:     Mouth: Mucous membranes are moist.     Pharynx: Oropharynx is clear.  Eyes:     General:        Right eye: No discharge.        Left eye: No discharge.     Extraocular Movements: Extraocular movements intact.     Conjunctiva/sclera: Conjunctivae normal.     Pupils: Pupils are equal, round, and reactive to light.  Cardiovascular:     Rate and Rhythm: Normal rate and regular rhythm.     Heart sounds: No murmur heard. Pulmonary:     Effort: Pulmonary effort is normal. No respiratory distress.     Breath sounds: Normal breath sounds. No wheezing or rales.  Musculoskeletal:     Cervical back: Normal range of motion and neck supple.  Skin:    General: Skin is warm and dry.     Capillary Refill: Capillary refill takes less than 2 seconds.  Neurological:  General: No focal deficit present.     Mental Status: She is alert and oriented to person, place, and time. Mental status is at baseline.  Psychiatric:        Mood and Affect: Mood normal.        Behavior: Behavior normal.        Thought Content: Thought content normal.        Judgment: Judgment normal.    Results for orders placed or performed during the hospital encounter of 02/10/20  Comprehensive metabolic panel  Result Value Ref Range   Sodium 139 135 - 145 mmol/L   Potassium 3.7 3.5 - 5.1 mmol/L   Chloride 109 98 - 111 mmol/L   CO2 21 (L) 22 - 32 mmol/L   Glucose, Bld 91 70 - 99 mg/dL   BUN 14 6 - 20 mg/dL   Creatinine, Ser 0.56 0.44 - 1.00 mg/dL   Calcium 8.7 (L) 8.9 - 10.3 mg/dL   Total Protein 6.9 6.5 - 8.1 g/dL   Albumin 3.9 3.5 - 5.0 g/dL   AST 17 15 - 41 U/L   ALT 11 0 - 44 U/L   Alkaline Phosphatase 72 38 - 126 U/L   Total Bilirubin 0.6 0.3 - 1.2 mg/dL   GFR, Estimated >60 >60 mL/min   Anion gap 9 5 - 15   CBC with Differential  Result Value Ref Range   WBC 6.7 4.0 - 10.5 K/uL   RBC 4.32 3.87 - 5.11 MIL/uL   Hemoglobin 13.4 12.0 - 15.0 g/dL   HCT 38.6 36.0 - 46.0 %   MCV 89.4 80.0 - 100.0 fL   MCH 31.0 26.0 - 34.0 pg   MCHC 34.7 30.0 - 36.0 g/dL   RDW 11.9 11.5 - 15.5 %   Platelets 227 150 - 400 K/uL   nRBC 0.0 0.0 - 0.2 %   Neutrophils Relative % 65 %   Neutro Abs 4.3 1.7 - 7.7 K/uL   Lymphocytes Relative 27 %   Lymphs Abs 1.8 0.7 - 4.0 K/uL   Monocytes Relative 6 %   Monocytes Absolute 0.4 0.1 - 1.0 K/uL   Eosinophils Relative 1 %   Eosinophils Absolute 0.1 0.0 - 0.5 K/uL   Basophils Relative 1 %   Basophils Absolute 0.0 0.0 - 0.1 K/uL   Immature Granulocytes 0 %   Abs Immature Granulocytes 0.02 0.00 - 0.07 K/uL      Assessment & Plan:   Problem List Items Addressed This Visit       Other   Depression with anxiety - Primary   Relevant Medications   venlafaxine XR (EFFEXOR-XR) 75 MG 24 hr capsule   traZODone (DESYREL) 100 MG tablet   Depression, major, recurrent, mild (HCC)    Chronic. Ongoing. Slightly improved from prior visit. Will increase Effexor to '75mg'$  daily.  Will continue with Trazodone nightly for sleep. Follow up in 1 month for reevaluation.        Relevant Medications   venlafaxine XR (EFFEXOR-XR) 75 MG 24 hr capsule   traZODone (DESYREL) 100 MG tablet     Follow up plan: Return in about 1 month (around 08/28/2020) for Physical and Fasting labs, Depression/Anxiety FU.

## 2020-09-02 ENCOUNTER — Other Ambulatory Visit: Payer: Self-pay | Admitting: Nurse Practitioner

## 2020-09-02 NOTE — Telephone Encounter (Signed)
Requested medication (s) are due for refill today:   Yes Had 30 day courtesy supply given  07/28/2020  Requested medication (s) are on the active medication list:   Yes  Future visit scheduled:   Yes for 09/15/2020 with Mathis Dad   Last ordered: 07/28/2020 #30, 0 refills  Returned because courtesy 30 day refill given 7/25.   Provider to review for refill until appt on 9/12   Requested Prescriptions  Pending Prescriptions Disp Refills   venlafaxine XR (EFFEXOR-XR) 75 MG 24 hr capsule [Pharmacy Med Name: Venlafaxine HCl ER 75 MG Oral Capsule Extended Release 24 Hour] 30 capsule 0    Sig: TAKE 1 CAPSULE BY MOUTH ONCE DAILY WITH BREAKFAST     Psychiatry: Antidepressants - SNRI - desvenlafaxine & venlafaxine Failed - 09/02/2020 11:08 AM      Failed - LDL in normal range and within 360 days    LDL Cholesterol  Date Value Ref Range Status  01/11/2013 113 (H) 0 - 99 mg/dL Final          Failed - Total Cholesterol in normal range and within 360 days    Cholesterol  Date Value Ref Range Status  01/11/2013 199 0 - 200 mg/dL Final    Comment:    ATP III Classification       Desirable:  < 200 mg/dL               Borderline High:  200 - 239 mg/dL          High:  > = 240 mg/dL          Failed - Triglycerides in normal range and within 360 days    Triglycerides  Date Value Ref Range Status  01/11/2013 45.0 0.0 - 149.0 mg/dL Final    Comment:    Normal:  <150 mg/dLBorderline High:  150 - 199 mg/dL          Passed - Completed PHQ-2 or PHQ-9 in the last 360 days      Passed - Last BP in normal range    BP Readings from Last 1 Encounters:  07/28/20 133/82          Passed - Valid encounter within last 6 months    Recent Outpatient Visits           1 month ago Depression with anxiety   [place], NP   2 months ago Depression, major, recurrent, mild ([place])   [facility] Jon Billings, NP   11 months ago Familial tremor   [facility] Olin Hauser, DO       Future Appointments             In 1 week Jon Billings, NP [facility], [place]

## 2020-09-02 NOTE — Telephone Encounter (Signed)
Patient last seen 07/28/20 and has appointment 09/15/20

## 2020-09-15 ENCOUNTER — Other Ambulatory Visit: Payer: Self-pay

## 2020-09-15 ENCOUNTER — Other Ambulatory Visit (HOSPITAL_COMMUNITY)
Admission: RE | Admit: 2020-09-15 | Discharge: 2020-09-15 | Disposition: A | Discharge status: Home / Self Care | Payer: 59 | Source: Ambulatory Visit | Attending: Nurse Practitioner | Admitting: Nurse Practitioner

## 2020-09-15 ENCOUNTER — Ambulatory Visit (INDEPENDENT_AMBULATORY_CARE_PROVIDER_SITE_OTHER): Payer: 59 | Admitting: Nurse Practitioner

## 2020-09-15 ENCOUNTER — Encounter: Payer: Self-pay | Admitting: Nurse Practitioner

## 2020-09-15 VITALS — BP 126/77 | HR 70 | Temp 98.2°F | Ht 63.0 in | Wt 183.0 lb

## 2020-09-15 DIAGNOSIS — Z124 Encounter for screening for malignant neoplasm of cervix: Secondary | ICD-10-CM | POA: Insufficient documentation

## 2020-09-15 DIAGNOSIS — Z0000 Encounter for general adult medical examination without abnormal findings: Secondary | ICD-10-CM

## 2020-09-15 DIAGNOSIS — F419 Anxiety disorder, unspecified: Secondary | ICD-10-CM | POA: Diagnosis not present

## 2020-09-15 DIAGNOSIS — R030 Elevated blood-pressure reading, without diagnosis of hypertension: Secondary | ICD-10-CM | POA: Diagnosis not present

## 2020-09-15 DIAGNOSIS — G4700 Insomnia, unspecified: Secondary | ICD-10-CM

## 2020-09-15 DIAGNOSIS — Z23 Encounter for immunization: Secondary | ICD-10-CM

## 2020-09-15 DIAGNOSIS — F418 Other specified anxiety disorders: Secondary | ICD-10-CM

## 2020-09-15 DIAGNOSIS — R8290 Unspecified abnormal findings in urine: Secondary | ICD-10-CM

## 2020-09-15 DIAGNOSIS — Z114 Encounter for screening for human immunodeficiency virus [HIV]: Secondary | ICD-10-CM

## 2020-09-15 DIAGNOSIS — F330 Major depressive disorder, recurrent, mild: Secondary | ICD-10-CM | POA: Diagnosis not present

## 2020-09-15 DIAGNOSIS — Z1159 Encounter for screening for other viral diseases: Secondary | ICD-10-CM

## 2020-09-15 DIAGNOSIS — K219 Gastro-esophageal reflux disease without esophagitis: Secondary | ICD-10-CM

## 2020-09-15 DIAGNOSIS — Z1211 Encounter for screening for malignant neoplasm of colon: Secondary | ICD-10-CM

## 2020-09-15 HISTORY — DX: Insomnia, unspecified: G47.00

## 2020-09-15 LAB — URINALYSIS, ROUTINE W REFLEX MICROSCOPIC
Bilirubin, UA: NEGATIVE
Glucose, UA: NEGATIVE
Ketones, UA: NEGATIVE
Nitrite, UA: NEGATIVE
Specific Gravity, UA: 1.025 (ref 1.005–1.030)
Urobilinogen, Ur: 0.2 mg/dL (ref 0.2–1.0)
pH, UA: 6 (ref 5.0–7.5)

## 2020-09-15 LAB — MICROSCOPIC EXAMINATION

## 2020-09-15 MED ORDER — VENLAFAXINE HCL ER 150 MG PO CP24: 150.0000 mg | ORAL_CAPSULE | Freq: Every day | ORAL | 0 refills | Status: DC

## 2020-09-15 MED ORDER — QUETIAPINE FUMARATE 25 MG PO TABS: 25.0000 mg | ORAL_TABLET | Freq: Every day | ORAL | 0 refills | Status: DC

## 2020-09-15 NOTE — Assessment & Plan Note (Signed)
Chronic. Well controlled at visit today. Will continue to reevaluate at future visits.

## 2020-09-15 NOTE — Progress Notes (Signed)
BP 126/77   Pulse 70   Temp 98.2 F (36.8 C) (Oral)   Ht '5\' 3"'$  (1.6 m)   Wt 183 lb (83 kg)   SpO2 95%   BMI 32.42 kg/m    Subjective:    Patient ID: Cynthia Sellers, female    DOB: 10/26/1965, 55 y.o.   MRN: [id number]  HPI: Cynthia Sellers is a 55 y.o. female presenting on 09/15/2020 for comprehensive medical examination. Current medical complaints include: feels like she is tasting food several days afterwards.   She currently lives with: Menopausal Symptoms: no  Patient has had a tremor for many years.  Has never seen a neurologist.  Unsure of the cause of the tremor.    Denies HA, CP, SOB, dizziness, palpitations, visual changes, and lower extremity swelling.   DEPRESSION/ANXIETY Patient states the Effexor is helping her.  She states she is still having trouble sleeping.  She is taking the Trazodone but still having trouble sleeping. Patient feels like she would like to increase the Effexor to see if it continues to help her mood. Denies SI.  GAD 7 : Generalized Anxiety Score 09/15/2020 07/28/2020 07/03/2020 09/11/2019  Nervous, Anxious, on Edge '3 3 3 3  '$ Control/stop worrying '3 3 3 3  '$ Worry too much - different things '3 3 3 3  '$ Trouble relaxing 1 0 0 3  Restless 0 0 0 0  Easily annoyed or irritable 0 '2 1 1  '$ Afraid - awful might happen 0 0 0 0  Total GAD 7 Score '10 11 10 13  '$ Anxiety Difficulty Not difficult at all Somewhat difficult Not difficult at all Somewhat difficult     Depression Screen done today and results listed below:  Depression screen [facility] 2/9 09/15/2020 07/28/2020 07/03/2020 09/11/2019  Decreased Interest '1 1 3 1  '$ Down, Depressed, Hopeless '1 3 1 2  '$ PHQ - 2 Score '2 4 4 3  '$ Altered sleeping '3 3 3 3  '$ Tired, decreased energy 3 0 0 3  Change in appetite '1 3 1 3  '$ Feeling bad or failure about yourself  0 1 0 2  Trouble concentrating 0 1 0 0  Moving slowly or fidgety/restless 0 0 0 0  Suicidal thoughts 0 0 0 0  PHQ-9 Score '9 12 8 14  '$ Difficult doing  work/chores Not difficult at all Not difficult at all Not difficult at all Somewhat difficult    The patient does not have a history of falls. I did complete a risk assessment for falls. A plan of care for falls was documented.   Past Medical History:  Past Medical History:  Diagnosis Date   Anxiety    Complication of anesthesia    Depression    GERD (gastroesophageal reflux disease)    Headache, tension type, episodic    Excedrin tension headache helps   Migraine    H/O MIGRAINES   PONV (postoperative nausea and vomiting)    WITH HYSTERECTOMY   Suicide attempt ([place]) 1992   Took overdose of sleeping pills   Tremor of both hands     Surgical History:  Past Surgical History:  Procedure Laterality Date   BILATERAL SALPINGOOPHORECTOMY  01/04/2010   HEEL SPUR EXCISION Left 01/05/2007   LIPOMA EXCISION Left 07/13/2017   Procedure: EXCISION LIPOMA-LEFT BREAST;  Surgeon: Robert Bellow, MD;  Location: [facility] ORS;  Service: General;  Laterality: Left;   TOTAL ABDOMINAL HYSTERECTOMY W/ BILATERAL SALPINGOOPHORECTOMY  01/04/2010    Medications:  Current Outpatient Medications on  File Prior to Visit  Medication Sig   omeprazole (PRILOSEC OTC) 20 MG tablet Take 20 mg by mouth as needed.   No current facility-administered medications on file prior to visit.    Allergies:  Allergies  Allergen Reactions   Vicodin [Hydrocodone-Acetaminophen] Nausea And Vomiting    Social History:  Social History   Socioeconomic History   Marital status: Widowed    Spouse name: Not on file   Number of children: 2   Years of education: 10   Highest education level: Not on file  Occupational History    [place]  Tobacco Use   Smoking status: Never   Smokeless tobacco: Never  Vaping Use   Vaping Use: Never used  Substance and Sexual Activity   Alcohol use: Never   Drug use: Never   Sexual activity: Not Currently    Birth control/protection: Surgical  Other  Topics Concern   Not on file  Social History Narrative   Not on file   Social Determinants of Health   Financial Resource Strain: Not on file  Food Insecurity: Not on file  Transportation Needs: Not on file  Physical Activity: Not on file  Stress: Not on file  Social Connections: Not on file  Intimate Partner Violence: Not on file   Social History   Tobacco Use  Smoking Status Never  Smokeless Tobacco Never   Social History   Substance and Sexual Activity  Alcohol Use Never    Family History:  Family History  Problem Relation Age of Onset   Heart disease Father 44   Tremor Father    Cancer Brother        throat and brain cancer   Heart disease Brother        deceased from MI   Tremor Brother    Arthritis Paternal Grandmother    Heart disease Paternal Grandmother    Hypertension Paternal Grandmother    Heart disease Paternal Grandfather    Stroke Paternal Grandfather    Hypertension Paternal Grandfather     Past medical history, surgical history, medications, allergies, family history and social history reviewed with patient today and changes made to appropriate areas of the chart.   Review of Systems  Eyes:  Negative for blurred vision and double vision.  Respiratory:  Negative for shortness of breath.   Cardiovascular:  Negative for chest pain, palpitations and leg swelling.  Neurological:  Positive for tremors. Negative for dizziness and headaches.  Psychiatric/Behavioral:  Positive for depression. Negative for suicidal ideas. The patient is nervous/anxious and has insomnia.   All other ROS negative except what is listed above and in the HPI.      Objective:    BP 126/77   Pulse 70   Temp 98.2 F (36.8 C) (Oral)   Ht '5\' 3"'$  (1.6 m)   Wt 183 lb (83 kg)   SpO2 95%   BMI 32.42 kg/m   Wt Readings from Last 3 Encounters:  09/15/20 183 lb (83 kg)  07/28/20 190 lb (86.2 kg)  07/03/20 190 lb 9.6 oz (86.5 kg)    Physical Exam Vitals and nursing note  reviewed. Exam conducted with a chaperone present Vance Peper, NP).  Constitutional:      General: She is awake. She is not in acute distress.    Appearance: She is well-developed. She is not ill-appearing.  HENT:     Head: Normocephalic and atraumatic.     Right Ear: Hearing, tympanic membrane, ear canal  and external ear normal. No drainage.     Left Ear: Hearing, tympanic membrane, ear canal and external ear normal. No drainage.     Nose: Nose normal.     Right Sinus: No maxillary sinus tenderness or frontal sinus tenderness.     Left Sinus: No maxillary sinus tenderness or frontal sinus tenderness.     Mouth/Throat:     Mouth: Mucous membranes are moist.     Pharynx: Oropharynx is clear. Uvula midline. No pharyngeal swelling, oropharyngeal exudate or posterior oropharyngeal erythema.  Eyes:     General: Lids are normal.        Right eye: No discharge.        Left eye: No discharge.     Extraocular Movements: Extraocular movements intact.     Conjunctiva/sclera: Conjunctivae normal.     Pupils: Pupils are equal, round, and reactive to light.     Visual Fields: Right eye visual fields normal and left eye visual fields normal.  Neck:     Thyroid: No thyromegaly.     Vascular: No carotid bruit.     Trachea: Trachea normal.  Cardiovascular:     Rate and Rhythm: Normal rate and regular rhythm.     Heart sounds: Normal heart sounds. No murmur heard.   No gallop.  Pulmonary:     Effort: Pulmonary effort is normal. No accessory muscle usage or respiratory distress.     Breath sounds: Normal breath sounds.  Chest:  Breasts:    Right: Normal.     Left: Normal.  Abdominal:     General: Bowel sounds are normal.     Palpations: Abdomen is soft. There is no hepatomegaly or splenomegaly.     Tenderness: There is no abdominal tenderness.  Genitourinary:    General: Normal vulva.     Vagina: Normal.     Cervix: Friability present.     Adnexa: Right adnexa normal and left adnexa  normal.  Musculoskeletal:        General: Normal range of motion.     Cervical back: Normal range of motion and neck supple.     Right lower leg: No edema.     Left lower leg: No edema.  Lymphadenopathy:     Head:     Right side of head: No submental, submandibular, tonsillar, preauricular or posterior auricular adenopathy.     Left side of head: No submental, submandibular, tonsillar, preauricular or posterior auricular adenopathy.     Cervical: No cervical adenopathy.     Upper Body:     Right upper body: No supraclavicular, axillary or pectoral adenopathy.     Left upper body: No supraclavicular, axillary or pectoral adenopathy.  Skin:    General: Skin is warm and dry.     Capillary Refill: Capillary refill takes less than 2 seconds.     Findings: No rash.  Neurological:     Mental Status: She is alert and oriented to person, place, and time.     Cranial Nerves: Cranial nerves are intact.     Motor: Tremor present.     Gait: Gait is intact.     Deep Tendon Reflexes: Reflexes are normal and symmetric.     Reflex Scores:      Brachioradialis reflexes are 2+ on the right side and 2+ on the left side.      Patellar reflexes are 2+ on the right side and 2+ on the left side. Psychiatric:        Attention and  Perception: Attention normal.        Mood and Affect: Mood normal.        Speech: Speech normal.        Behavior: Behavior normal. Behavior is cooperative.        Thought Content: Thought content normal.        Judgment: Judgment normal.    Results for orders placed or performed in visit on 09/15/20  Microscopic Examination   Urine  Result Value Ref Range   WBC, UA 0-5 0 - 5 /hpf   RBC 3-10 (A) 0 - 2 /hpf   Epithelial Cells (non renal) 0-10 0 - 10 /hpf   Mucus, UA Present (A) Not Estab.   Bacteria, UA Few (A) None seen/Few  Urinalysis, Routine w reflex microscopic  Result Value Ref Range   Specific Gravity, UA 1.025 1.005 - 1.030   pH, UA 6.0 5.0 - 7.5   Color, UA  Yellow Yellow   Appearance Ur Cloudy (A) Clear   Leukocytes,UA 1+ (A) Negative   Protein,UA Trace (A) Negative/Trace   Glucose, UA Negative Negative   Ketones, UA Negative Negative   RBC, UA 2+ (A) Negative   Bilirubin, UA Negative Negative   Urobilinogen, Ur 0.2 0.2 - 1.0 mg/dL   Nitrite, UA Negative Negative   Microscopic Examination See below:       Assessment & Plan:   Problem List Items Addressed This Visit       Other   Depression with anxiety    Chronic.  Improving. Will increase Effexor to '150mg'$  daily.  Patient understands and agrees with the plan.  Labs ordered today.       Relevant Medications   venlafaxine XR (EFFEXOR XR) 150 MG 24 hr capsule   Anxiety   Relevant Medications   venlafaxine XR (EFFEXOR XR) 150 MG 24 hr capsule   Elevated BP without diagnosis of hypertension    Chronic. Well controlled at visit today. Will continue to reevaluate at future visits.       Depression, major, recurrent, mild (HCC)    Chronic.  Improving. Will increase Effexor to '150mg'$  daily.  Patient understands and agrees with the plan.  Labs ordered today.       Relevant Medications   venlafaxine XR (EFFEXOR XR) 150 MG 24 hr capsule   Insomnia    Chronic.  Not well controlled.  Will change Trazodone to Seroquel '25mg'$  daily. Side effects and benefits discussed with patient during visit.  Follow up in 2 months for reevaluation.      Other Visit Diagnoses     Annual physical exam    -  Primary   Health Maintenance reviewed during visit today. TDAP and flu given during visit today. PAP obtained. Referral placed for GI.   Relevant Orders   CBC with Differential/Platelet   Comprehensive metabolic panel   Lipid panel   TSH   Urinalysis, Routine w reflex microscopic (Completed)   Cytology - PAP   Encounter for hepatitis C screening test for low risk patient       Relevant Orders   Hepatitis C Antibody   Screening for HIV (human immunodeficiency virus)       Relevant Orders   HIV  Antibody (routine testing w rflx)   Screening for cervical cancer       PAP obtained in normal fashion.  Patient tolerated obtaining of PAP without complication. Escorted by Vance Peper, NP   Relevant Orders   Cytology - PAP  Need for tetanus, diphtheria, and acellular pertussis (Tdap) vaccine       Relevant Orders   Tdap vaccine greater than or equal to 7yo IM   Need for influenza vaccination       Relevant Orders   Flu Vaccine QUAD 29moIM (Fluarix, Fluzone & Alfiuria Quad PF)   Screening for colon cancer       Referral placed for patient to have colonoscopy done.   Relevant Orders   Ambulatory referral to Gastroenterology   Gastroesophageal reflux disease, unspecified whether esophagitis present       Referral placed for patient to see GI for further evaluation and treatment of Reflux.   Relevant Orders   Ambulatory referral to Gastroenterology   Abnormal urinalysis       Relevant Orders   Urine Culture        Follow up plan: Return in about 3 months (around 12/15/2020) for Depression/Anxiety FU.   LABORATORY TESTING:  - Pap smear: pap done  IMMUNIZATIONS:   - Tdap: Tetanus vaccination status reviewed: last tetanus booster within 10 years. - Influenza: Up to date - Pneumovax: Not applicable - Prevnar: Not applicable - HPV: Not applicable - Zostavax vaccine: Not applicable  SCREENING: -Mammogram:  Already ordered   - Colonoscopy: Ordered today  - Bone Density: Not applicable  -Hearing Test: Not applicable  -Spirometry: Not applicable   PATIENT COUNSELING:   Advised to take 1 mg of folate supplement per day if capable of pregnancy.   Sexuality: Discussed sexually transmitted diseases, partner selection, use of condoms, avoidance of unintended pregnancy  and contraceptive alternatives.   Advised to avoid cigarette smoking.  I discussed with the patient that most people either abstain from alcohol or drink within safe limits (<=14/week and <=4 drinks/occasion  for males, <=7/weeks and <= 3 drinks/occasion for females) and that the risk for alcohol disorders and other health effects rises proportionally with the number of drinks per week and how often a drinker exceeds daily limits.  Discussed cessation/primary prevention of drug use and availability of treatment for abuse.   Diet: Encouraged to adjust caloric intake to maintain  or achieve ideal body weight, to reduce intake of dietary saturated fat and total fat, to limit sodium intake by avoiding high sodium foods and not adding table salt, and to maintain adequate dietary potassium and calcium preferably from fresh fruits, vegetables, and low-fat dairy products.    stressed the importance of regular exercise  Injury prevention: Discussed safety belts, safety helmets, smoke detector, smoking near bedding or upholstery.   Dental health: Discussed importance of regular tooth brushing, flossing, and dental visits.    NEXT PREVENTATIVE PHYSICAL DUE IN 1 YEAR. Return in about 3 months (around 12/15/2020) for Depression/Anxiety FU.

## 2020-09-15 NOTE — Assessment & Plan Note (Signed)
Chronic.  Improving. Will increase Effexor to '150mg'$  daily.  Patient understands and agrees with the plan.  Labs ordered today.

## 2020-09-15 NOTE — Assessment & Plan Note (Signed)
Chronic.  Not well controlled.  Will change Trazodone to Seroquel '25mg'$  daily. Side effects and benefits discussed with patient during visit.  Follow up in 2 months for reevaluation.

## 2020-09-16 LAB — COMPREHENSIVE METABOLIC PANEL
ALT: 6 IU/L (ref 0–32)
AST: 11 IU/L (ref 0–40)
Albumin/Globulin Ratio: 2.2 (ref 1.2–2.2)
Albumin: 4.6 g/dL (ref 3.8–4.9)
Alkaline Phosphatase: 87 IU/L (ref 44–121)
BUN/Creatinine Ratio: 13 (ref 9–23)
BUN: 9 mg/dL (ref 6–24)
Bilirubin Total: 0.4 mg/dL (ref 0.0–1.2)
CO2: 23 mmol/L (ref 20–29)
Calcium: 8.9 mg/dL (ref 8.7–10.2)
Chloride: 105 mmol/L (ref 96–106)
Creatinine, Ser: 0.68 mg/dL (ref 0.57–1.00)
Globulin, Total: 2.1 g/dL (ref 1.5–4.5)
Glucose: 83 mg/dL (ref 65–99)
Potassium: 3.9 mmol/L (ref 3.5–5.2)
Sodium: 142 mmol/L (ref 134–144)
Total Protein: 6.7 g/dL (ref 6.0–8.5)
eGFR: 102 mL/min/{1.73_m2} (ref >59)

## 2020-09-16 LAB — HEPATITIS C ANTIBODY: Hep C Virus Ab: <0.1 s/co ratio (ref 0.0–0.9)

## 2020-09-16 LAB — LIPID PANEL
Chol/HDL Ratio: 3.4 ratio (ref 0.0–4.4)
Cholesterol, Total: 210 mg/dL — ABNORMAL HIGH (ref 100–199)
HDL: 62 mg/dL (ref >39)
LDL Chol Calc (NIH): 122 mg/dL — ABNORMAL HIGH (ref 0–99)
Triglycerides: 149 mg/dL (ref 0–149)
VLDL Cholesterol Cal: 26 mg/dL (ref 5–40)

## 2020-09-16 LAB — CBC WITH DIFFERENTIAL/PLATELET
Basophils Absolute: 0 10*3/uL (ref 0.0–0.2)
Basos: 0 %
EOS (ABSOLUTE): 0.1 10*3/uL (ref 0.0–0.4)
Eos: 2 %
Hematocrit: 42 % (ref 34.0–46.6)
Hemoglobin: 13.5 g/dL (ref 11.1–15.9)
Immature Grans (Abs): 0 10*3/uL (ref 0.0–0.1)
Immature Granulocytes: 0 %
Lymphocytes Absolute: 1.7 10*3/uL (ref 0.7–3.1)
Lymphs: 38 %
MCH: 30.4 pg (ref 26.6–33.0)
MCHC: 32.1 g/dL (ref 31.5–35.7)
MCV: 95 fL (ref 79–97)
Monocytes Absolute: 0.4 10*3/uL (ref 0.1–0.9)
Monocytes: 8 %
Neutrophils Absolute: 2.3 10*3/uL (ref 1.4–7.0)
Neutrophils: 52 %
Platelets: 244 10*3/uL (ref 150–450)
RBC: 4.44 x10E6/uL (ref 3.77–5.28)
RDW: 12.3 % (ref 11.7–15.4)
WBC: 4.5 10*3/uL (ref 3.4–10.8)

## 2020-09-16 LAB — HIV ANTIBODY (ROUTINE TESTING W REFLEX): HIV Screen 4th Generation wRfx: NONREACTIVE

## 2020-09-16 LAB — TSH: TSH: 1.49 u[IU]/mL (ref 0.450–4.500)

## 2020-09-16 NOTE — Progress Notes (Signed)
Please let patient know that her blood work looks good.  Her complete blood count and thyroid are normal.  Her liver, kidneys and electrolytes are normal.  No evidence of hepatitis c or hiv.  Patient's urine was abnormal so I sent it for culture to make sure she doesn't have a UTI.  Patient's cholesterol is elevated.  I recommend she follow a low fat diet and we will continue to monitor in the future.  Please let me know if she has any questions.

## 2020-09-18 LAB — URINE CULTURE

## 2020-09-18 NOTE — Progress Notes (Signed)
Please let patient know there is no evidence of a UTI on her urine culture.  No need for further treatment.

## 2020-09-19 LAB — CYTOLOGY - PAP
Adequacy: ABSENT
Diagnosis: NEGATIVE
Diagnosis: REACTIVE

## 2020-09-22 MED ORDER — ARIPIPRAZOLE 2 MG PO TABS: 2.0000 mg | ORAL_TABLET | Freq: Every day | ORAL | 0 refills | Status: DC

## 2020-09-22 NOTE — Addendum Note (Signed)
Addended by: Jon Billings on: 09/22/2020 12:40 PM   Modules accepted: Orders

## 2020-09-22 NOTE — Progress Notes (Signed)
Please let patient know the medication is not for blood pressure. I have changed the medication to Abilify to see if this helps with her sleep.

## 2020-09-22 NOTE — Progress Notes (Signed)
Please let patient know her PAP was normal.  We will repeat it in 5 years.

## 2020-10-24 ENCOUNTER — Other Ambulatory Visit: Payer: Self-pay

## 2020-10-24 ENCOUNTER — Ambulatory Visit (INDEPENDENT_AMBULATORY_CARE_PROVIDER_SITE_OTHER): Payer: 59 | Admitting: Gastroenterology

## 2020-10-24 ENCOUNTER — Encounter: Payer: Self-pay | Admitting: Gastroenterology

## 2020-10-24 VITALS — BP 145/100 | HR 83 | Temp 97.4°F | Ht 63.0 in | Wt 179.0 lb

## 2020-10-24 DIAGNOSIS — K219 Gastro-esophageal reflux disease without esophagitis: Secondary | ICD-10-CM

## 2020-10-24 DIAGNOSIS — K5904 Chronic idiopathic constipation: Secondary | ICD-10-CM

## 2020-10-24 DIAGNOSIS — Z1211 Encounter for screening for malignant neoplasm of colon: Secondary | ICD-10-CM | POA: Diagnosis not present

## 2020-10-24 MED ORDER — NA SULFATE-K SULFATE-MG SULF 17.5-3.13-1.6 GM/177ML PO SOLN: 1.0000 | Freq: Once | ORAL | 0 refills | Status: AC

## 2020-10-24 NOTE — Patient Instructions (Signed)
High-Fiber Eating Plan Fiber, also called dietary fiber, is a type of carbohydrate. It is found foods such as fruits, vegetables, whole grains, and beans. A high-fiber diet can have many health benefits. Your health care provider may recommend a high-fiber diet to help: Prevent constipation. Fiber can make your bowel movements more regular. Lower your cholesterol. Relieve the following conditions: Inflammation of veins in the anus (hemorrhoids). Inflammation of specific areas of the digestive tract (uncomplicated diverticulosis). A problem of the large intestine, also called the colon, that sometimes causes pain and diarrhea (irritable bowel syndrome, or IBS). Prevent overeating as part of a weight-loss plan. Prevent heart disease, type 2 diabetes, and certain cancers. What are tips for following this plan? Reading food labels  Check the nutrition facts label on food products for the amount of dietary fiber. Choose foods that have 5 grams of fiber or more per serving. The goals for recommended daily fiber intake include: Men (age 50 or younger): 34-38 g. Men (over age 50): 28-34 g. Women (age 50 or younger): 25-28 g. Women (over age 50): 22-25 g. Your daily fiber goal is _____________ g. Shopping Choose whole fruits and vegetables instead of processed forms, such as apple juice or applesauce. Choose a wide variety of high-fiber foods such as avocados, lentils, oats, and kidney beans. Read the nutrition facts label of the foods you choose. Be aware of foods with added fiber. These foods often have high sugar and sodium amounts per serving. Cooking Use whole-grain flour for baking and cooking. Cook with brown rice instead of white rice. Meal planning Start the day with a breakfast that is high in fiber, such as a cereal that contains 5 g of fiber or more per serving. Eat breads and cereals that are made with whole-grain flour instead of refined flour or white flour. Eat brown rice, bulgur  wheat, or millet instead of white rice. Use beans in place of meat in soups, salads, and pasta dishes. Be sure that half of the grains you eat each day are whole grains. General information You can get the recommended daily intake of dietary fiber by: Eating a variety of fruits, vegetables, grains, nuts, and beans. Taking a fiber supplement if you are not able to take in enough fiber in your diet. It is better to get fiber through food than from a supplement. Gradually increase how much fiber you consume. If you increase your intake of dietary fiber too quickly, you may have bloating, cramping, or gas. Drink plenty of water to help you digest fiber. Choose high-fiber snacks, such as berries, raw vegetables, nuts, and popcorn. What foods should I eat? Fruits Berries. Pears. Apples. Oranges. Avocado. Prunes and raisins. Dried figs. Vegetables Sweet potatoes. Spinach. Kale. Artichokes. Cabbage. Broccoli. Cauliflower. Green peas. Carrots. Squash. Grains Whole-grain breads. Multigrain cereal. Oats and oatmeal. Brown rice. Barley. Bulgur wheat. Millet. Quinoa. Bran muffins. Popcorn. Rye wafer crackers. Meats and other proteins Navy beans, kidney beans, and pinto beans. Soybeans. Split peas. Lentils. Nuts and seeds. Dairy Fiber-fortified yogurt. Beverages Fiber-fortified soy milk. Fiber-fortified orange juice. Other foods Fiber bars. The items listed above may not be a complete list of recommended foods and beverages. Contact a dietitian for more information. What foods should I avoid? Fruits Fruit juice. Cooked, strained fruit. Vegetables Fried potatoes. Canned vegetables. Well-cooked vegetables. Grains White bread. Pasta made with refined flour. White rice. Meats and other proteins Fatty cuts of meat. Fried chicken or fried fish. Dairy Milk. Yogurt. Cream cheese. Sour cream. Fats and   oils Butters. Beverages Soft drinks. Other foods Cakes and pastries. The items listed above may  not be a complete list of foods and beverages to avoid. Talk with your dietitian about what choices are best for you. Summary Fiber is a type of carbohydrate. It is found in foods such as fruits, vegetables, whole grains, and beans. A high-fiber diet has many benefits. It can help to prevent constipation, lower blood cholesterol, aid weight loss, and reduce your risk of heart disease, diabetes, and certain cancers. Increase your intake of fiber gradually. Increasing fiber too quickly may cause cramping, bloating, and gas. Drink plenty of water while you increase the amount of fiber you consume. The best sources of fiber include whole fruits and vegetables, whole grains, nuts, seeds, and beans. This information is not intended to replace advice given to you by your health care provider. Make sure you discuss any questions you have with your health care provider. Document Revised: 04/26/2019 Document Reviewed: 04/26/2019 Elsevier Patient Education  2022 [place]. Gastroesophageal Reflux Disease, Adult Gastroesophageal reflux (GER) happens when acid from the stomach flows up into the tube that connects the mouth and the stomach (esophagus). Normally, food travels down the esophagus and stays in the stomach to be digested. With GER, food and stomach acid sometimes move back up into the esophagus. You may have a disease called gastroesophageal reflux disease (GERD) if the reflux: Happens often. Causes frequent or very bad symptoms. Causes problems such as damage to the esophagus. When this happens, the esophagus becomes sore and swollen. Over time, GERD can make small holes (ulcers) in the lining of the esophagus. What are the causes? This condition is caused by a problem with the muscle between the esophagus and the stomach. When this muscle is weak or not normal, it does not close properly to keep food and acid from coming back up from the stomach. The muscle can be weak because of: Tobacco  use. Pregnancy. Having a certain type of hernia (hiatal hernia). Alcohol use. Certain foods and drinks, such as coffee, chocolate, onions, and peppermint. What increases the risk? Being overweight. Having a disease that affects your connective tissue. Taking NSAIDs, such a ibuprofen. What are the signs or symptoms? Heartburn. Difficult or painful swallowing. The feeling of having a lump in the throat. A bitter taste in the mouth. Bad breath. Having a lot of saliva. Having an upset or bloated stomach. Burping. Chest pain. Different conditions can cause chest pain. Make sure you see your doctor if you have chest pain. Shortness of breath or wheezing. A long-term cough or a cough at night. Wearing away of the surface of teeth (tooth enamel). Weight loss. How is this treated? Making changes to your diet. Taking medicine. Having surgery. Treatment will depend on how bad your symptoms are. Follow these instructions at home: Eating and drinking  Follow a diet as told by your doctor. You may need to avoid foods and drinks such as: Coffee and tea, with or without caffeine. Drinks that contain alcohol. Energy drinks and sports drinks. Bubbly (carbonated) drinks or sodas. Chocolate and cocoa. Peppermint and mint flavorings. Garlic and onions. Horseradish. Spicy and acidic foods. These include peppers, chili powder, curry powder, vinegar, hot sauces, and BBQ sauce. Citrus fruit juices and citrus fruits, such as oranges, lemons, and limes. Tomato-based foods. These include red sauce, chili, salsa, and pizza with red sauce. Fried and fatty foods. These include donuts, french fries, potato chips, and high-fat dressings. High-fat meats. These include hot  dogs, rib eye steak, sausage, ham, and bacon. High-fat dairy items, such as whole milk, butter, and cream cheese. Eat small meals often. Avoid eating large meals. Avoid drinking large amounts of liquid with your meals. Avoid eating  meals during the 2-3 hours before bedtime. Avoid lying down right after you eat. Do not exercise right after you eat. Lifestyle  Do not smoke or use any products that contain nicotine or tobacco. If you need help quitting, ask your doctor. Try to lower your stress. If you need help doing this, ask your doctor. If you are overweight, lose an amount of weight that is healthy for you. Ask your doctor about a safe weight loss goal. General instructions Pay attention to any changes in your symptoms. Take over-the-counter and prescription medicines only as told by your doctor. Do not take aspirin, ibuprofen, or other NSAIDs unless your doctor says it is okay. Wear loose clothes. Do not wear anything tight around your waist. Raise (elevate) the head of your bed about 6 inches (15 cm). You may need to use a wedge to do this. Avoid bending over if this makes your symptoms worse. Keep all follow-up visits. Contact a doctor if: You have new symptoms. You lose weight and you do not know why. You have trouble swallowing or it hurts to swallow. You have wheezing or a cough that keeps happening. You have a hoarse voice. Your symptoms do not get better with treatment. Get help right away if: You have sudden pain in your arms, neck, jaw, teeth, or back. You suddenly feel sweaty, dizzy, or light-headed. You have chest pain or shortness of breath. You vomit and the vomit is green, yellow, or black, or it looks like blood or coffee grounds. You faint. Your poop (stool) is red, bloody, or black. You cannot swallow, drink, or eat. These symptoms may represent a serious problem that is an emergency. Do not wait to see if the symptoms will go away. Get medical help right away. Call your local emergency services (911 in the [place] Do not drive yourself to the hospital. Summary If a person has gastroesophageal reflux disease (GERD), food and stomach acid move back up into the esophagus and cause symptoms or  problems such as damage to the esophagus. Treatment will depend on how bad your symptoms are. Follow a diet as told by your doctor. Take all medicines only as told by your doctor. This information is not intended to replace advice given to you by your health care provider. Make sure you discuss any questions you have with your health care provider. Document Revised: 07/02/2019 Document Reviewed: 07/02/2019 Elsevier Patient Education  [place].

## 2020-10-24 NOTE — Progress Notes (Signed)
Cynthia Darby, MD [address]  [place]  Cynthia S[place], Cyn[place] S[place] [address]  Main: [phone]  Fax: [phone]    Gastroenterology Consultation  Referring Provider:     Jon Billings, NP Primary Care Physician:  Jon Billings, NP Primary Gastroenterologist:  Dr. Cephas Sellers Reason for Consultation:     Chronic GERD and chronic constipation        HPI:   Cynthia Sellers is a 55 y.o. female referred by Dr. Jon Billings, NP  for consultation & management of chronic GERD and chronic constipation.  Chronic GERD: Patient reports that she has been experiencing heartburn, regurgitation and belching after each meal.  She has been experiencing these symptoms for several years, have worsened lately since the death of her son who passed away suddenly.  She is on omeprazole 20 mg as needed only.  She does experience heartburn at night.  She denies any difficulty swallowing.  She has been losing weight because of decreased appetite ever since her son passed away.  Chronic constipation: Patient reports hard stools, about once a week associated with significant straining, describes on Bristol stool scale as 1.  She denies any rectal bleeding.  She takes laxatives as needed only.  CBC, CMP, TSH are unremarkable  Patient denies any abdominal surgeries  NSAIDs: None  Antiplts/Anticoagulants/Anti thrombotics: None  GI Procedures: None She denies family history of GI malignancy  Past Medical History:  Diagnosis Date   Anxiety    Complication of anesthesia    Depression    GERD (gastroesophageal reflux disease)    Headache, tension type, episodic    Excedrin tension headache helps   Migraine    H/O MIGRAINES   PONV (postoperative nausea and vomiting)    WITH HYSTERECTOMY   Suicide attempt ([place]) 1992   Took overdose of sleeping pills   Tremor of both hands     Past Surgical History:  Procedure Laterality Date   BILATERAL SALPINGOOPHORECTOMY  01/04/2010   HEEL  SPUR EXCISION Left 01/05/2007   LIPOMA EXCISION Left 07/13/2017   Procedure: EXCISION LIPOMA-LEFT BREAST;  Surgeon: Robert Bellow, MD;  Location: [facility] ORS;  Service: General;  Laterality: Left;   TOTAL ABDOMINAL HYSTERECTOMY W/ BILATERAL SALPINGOOPHORECTOMY  01/04/2010     Current Outpatient Medications:    ARIPiprazole (ABILIFY) 2 MG tablet, Take 1 tablet (2 mg total) by mouth daily., Disp: 30 tablet, Rfl: 0   omeprazole (PRILOSEC OTC) 20 MG tablet, Take 20 mg by mouth as needed., Disp: , Rfl:    traZODone (DESYREL) 100 MG tablet, Take 100 mg by mouth at bedtime., Disp: , Rfl:    venlafaxine XR (EFFEXOR XR) 150 MG 24 hr capsule, Take 1 capsule (150 mg total) by mouth daily with breakfast., Disp: 60 capsule, Rfl: 0    Family History  Problem Relation Age of Onset   Heart disease Father 49   Tremor Father    Cancer Brother        throat and brain cancer   Heart disease Brother        deceased from MI   Tremor Brother    Arthritis Paternal Grandmother    Heart disease Paternal Grandmother    Hypertension Paternal Grandmother    Heart disease Paternal Grandfather    Stroke Paternal Grandfather    Hypertension Paternal Grandfather      Social History   Tobacco Use   Smoking status: Never   Smokeless tobacco: Never  Vaping Use   Vaping  Use: Never used  Substance Use Topics   Alcohol use: Never   Drug use: Never    Allergies as of 10/24/2020 - Review Complete 10/24/2020  Allergen Reaction Noted   Vicodin [hydrocodone-acetaminophen] Nausea And Vomiting 09/11/2019    Review of Systems:    All systems reviewed and negative except where noted in HPI.   Physical Exam:  BP (!) 145/100 (BP Location: Left Arm, Patient Position: Sitting, Cuff Size: Normal)   Pulse 83   Temp (!) 97.4 F (36.3 C) (Oral)   Ht 5\' 3"  (1.6 m)   Wt 179 lb (81.2 kg)   BMI 31.71 kg/m  No LMP recorded. Patient has had a hysterectomy.  General:   Alert,  Well-developed, well-nourished,  pleasant and cooperative in NAD Head:  Normocephalic and atraumatic. Eyes:  Sclera clear, no icterus.   Conjunctiva pink. Ears:  Normal auditory acuity. Nose:  No deformity, discharge, or lesions. Mouth:  No deformity or lesions,oropharynx pink & moist. Neck:  Supple; no masses or thyromegaly. Lungs:  Respirations even and unlabored.  Clear throughout to auscultation.   No wheezes, crackles, or rhonchi. No acute distress. Heart:  Regular rate and rhythm; no murmurs, clicks, rubs, or gallops. Abdomen:  Normal bowel sounds. Soft, non-tender and non-distended without masses, hepatosplenomegaly or hernias noted.  No guarding or rebound tenderness.   Rectal: Not performed Msk:  Symmetrical without gross deformities. Good, equal movement & strength bilaterally. Pulses:  Normal pulses noted. Extremities:  No clubbing or edema.  No cyanosis. Neurologic:  Alert and oriented x3;  grossly normal neurologically. Skin:  Intact without significant lesions or rashes. No jaundice. Psych:  Alert and cooperative. Normal mood and affect.  Imaging Studies: Reviewed  Assessment and Plan:   Cynthia Sellers is a 55 y.o. pleasant Caucasian female with history of anxiety, resting tremors is seen in consultation for chronic GERD and chronic idiopathic constipation  Chronic GERD Discussed about antireflux lifestyle, information provided Advised to take omeprazole 20 mg before each meal Recommend EGD for further evaluation  Chronic idiopathic constipation Her diet is devoid of fiber, information provided Reiterated on adequate intake of water Advised to cut back on sugary drinks Start MiraLAX daily  Colon cancer screening Recommend colonoscopy with 2-day prep  I have discussed alternative options, risks & benefits,  which include, but are not limited to, bleeding, infection, perforation,respiratory complication & drug reaction.  The patient agrees with this plan & written consent will be obtained.      Follow up in 4 months   Cynthia Darby, MD

## 2020-10-27 ENCOUNTER — Other Ambulatory Visit: Payer: Self-pay | Admitting: Nurse Practitioner

## 2020-10-27 NOTE — Telephone Encounter (Signed)
Requested medications are on the active medication list yes  Last visit 09/15/20  Future visit scheduled 12/15/20  Notes to clinic Historical provider, please assess.  Requested Prescriptions  Pending Prescriptions Disp Refills   traZODone (DESYREL) 100 MG tablet [Pharmacy Med Name: traZODone HCl 100 MG Oral Tablet] 90 tablet 0    Sig: TAKE 1 TABLET BY MOUTH AT BEDTIME AS NEEDED FOR SLEEP     Psychiatry: Antidepressants - Serotonin Modulator Passed - 10/27/2020  1:10 PM      Passed - Completed PHQ-2 or PHQ-9 in the last 360 days      Passed - Valid encounter within last 6 months    Recent Outpatient Visits           1 month ago Annual physical exam   [place], NP   3 months ago Depression with anxiety   [place], NP   3 months ago Depression, major, recurrent, mild ([place])   [facility] Jon Billings, NP   1 year ago Familial tremor   [place], Devonne Doughty, DO       Future Appointments             In 1 month Jon Billings, NP [facility], [place]   In 4 months Vanga, Tally Due, MD [facility]

## 2020-11-01 ENCOUNTER — Other Ambulatory Visit: Payer: Self-pay | Admitting: Nurse Practitioner

## 2020-11-01 NOTE — Telephone Encounter (Signed)
last RF 07/03/20 #30 ended 07/28/20

## 2020-11-10 ENCOUNTER — Ambulatory Visit: Payer: 59 | Admitting: Anesthesiology

## 2020-11-10 ENCOUNTER — Encounter: Payer: Self-pay | Admitting: Gastroenterology

## 2020-11-10 ENCOUNTER — Ambulatory Visit
Admission: RE | Admit: 2020-11-10 | Discharge: 2020-11-10 | Disposition: A | Discharge status: Home / Self Care | Payer: 59 | Attending: Gastroenterology | Admitting: Gastroenterology

## 2020-11-10 ENCOUNTER — Encounter
Admission: RE | Disposition: A | Discharge status: Home / Self Care | Payer: Self-pay | Source: Home / Self Care | Attending: Gastroenterology

## 2020-11-10 DIAGNOSIS — F419 Anxiety disorder, unspecified: Secondary | ICD-10-CM | POA: Insufficient documentation

## 2020-11-10 DIAGNOSIS — Z90722 Acquired absence of ovaries, bilateral: Secondary | ICD-10-CM | POA: Diagnosis not present

## 2020-11-10 DIAGNOSIS — K635 Polyp of colon: Secondary | ICD-10-CM

## 2020-11-10 DIAGNOSIS — K3189 Other diseases of stomach and duodenum: Secondary | ICD-10-CM | POA: Diagnosis not present

## 2020-11-10 DIAGNOSIS — K259 Gastric ulcer, unspecified as acute or chronic, without hemorrhage or perforation: Secondary | ICD-10-CM | POA: Diagnosis not present

## 2020-11-10 DIAGNOSIS — K2970 Gastritis, unspecified, without bleeding: Secondary | ICD-10-CM | POA: Diagnosis not present

## 2020-11-10 DIAGNOSIS — K219 Gastro-esophageal reflux disease without esophagitis: Secondary | ICD-10-CM | POA: Diagnosis present

## 2020-11-10 DIAGNOSIS — Z1211 Encounter for screening for malignant neoplasm of colon: Secondary | ICD-10-CM | POA: Diagnosis present

## 2020-11-10 DIAGNOSIS — K5730 Diverticulosis of large intestine without perforation or abscess without bleeding: Secondary | ICD-10-CM | POA: Diagnosis not present

## 2020-11-10 DIAGNOSIS — K644 Residual hemorrhoidal skin tags: Secondary | ICD-10-CM | POA: Insufficient documentation

## 2020-11-10 DIAGNOSIS — D122 Benign neoplasm of ascending colon: Secondary | ICD-10-CM | POA: Diagnosis not present

## 2020-11-10 DIAGNOSIS — K5904 Chronic idiopathic constipation: Secondary | ICD-10-CM

## 2020-11-10 DIAGNOSIS — K449 Diaphragmatic hernia without obstruction or gangrene: Secondary | ICD-10-CM | POA: Insufficient documentation

## 2020-11-10 DIAGNOSIS — Z9151 Personal history of suicidal behavior: Secondary | ICD-10-CM | POA: Diagnosis not present

## 2020-11-10 DIAGNOSIS — F32A Depression, unspecified: Secondary | ICD-10-CM | POA: Insufficient documentation

## 2020-11-10 HISTORY — PX: COLONOSCOPY WITH PROPOFOL: SHX5780

## 2020-11-10 HISTORY — PX: ESOPHAGOGASTRODUODENOSCOPY: SHX5428

## 2020-11-10 SURGERY — COLONOSCOPY WITH PROPOFOL
Anesthesia: General

## 2020-11-10 MED ORDER — PROPOFOL 500 MG/50ML IV EMUL
INTRAVENOUS | Status: AC
  Filled 2020-11-10: qty 50

## 2020-11-10 MED ORDER — PROPOFOL 10 MG/ML IV BOLUS
INTRAVENOUS | Status: DC | PRN
  Administered 2020-11-10: 70 mg via INTRAVENOUS
  Administered 2020-11-10: 30 mg via INTRAVENOUS

## 2020-11-10 MED ORDER — PROPOFOL 500 MG/50ML IV EMUL
INTRAVENOUS | Status: DC | PRN
  Administered 2020-11-10: 175 ug/kg/min via INTRAVENOUS

## 2020-11-10 MED ORDER — OMEPRAZOLE MAGNESIUM 20 MG PO TBEC: 20.0000 mg | DELAYED_RELEASE_TABLET | Freq: Two times a day (BID) | ORAL | 0 refills | Status: DC

## 2020-11-10 MED ORDER — SODIUM CHLORIDE 0.9 % IV SOLN: INTRAVENOUS | Status: DC

## 2020-11-10 MED ORDER — GLYCOPYRROLATE 0.2 MG/ML IJ SOLN
INTRAMUSCULAR | Status: DC | PRN
  Administered 2020-11-10: .1 mg via INTRAVENOUS

## 2020-11-10 MED ORDER — LIDOCAINE HCL (PF) 2 % IJ SOLN
INTRAMUSCULAR | Status: AC
  Filled 2020-11-10: qty 5

## 2020-11-10 MED ORDER — PROPOFOL 10 MG/ML IV BOLUS
INTRAVENOUS | Status: AC
  Filled 2020-11-10: qty 20

## 2020-11-10 MED ORDER — LIDOCAINE HCL (CARDIAC) PF 100 MG/5ML IV SOSY
PREFILLED_SYRINGE | INTRAVENOUS | Status: DC | PRN
Start: 2020-11-10 — End: 2020-11-10
  Administered 2020-11-10: 50 mg via INTRAVENOUS

## 2020-11-10 NOTE — Anesthesia Postprocedure Evaluation (Signed)
Anesthesia Post Note  Patient: Cynthia Sellers  Procedure(s) Performed: COLONOSCOPY WITH PROPOFOL ESOPHAGOGASTRODUODENOSCOPY (EGD)  Patient location during evaluation: PACU Anesthesia Type: General Level of consciousness: awake, awake and alert and oriented Pain management: pain level controlled Vital Signs Assessment: post-procedure vital signs reviewed and stable Respiratory status: spontaneous breathing and respiratory function stable Cardiovascular status: blood pressure returned to baseline Anesthetic complications: no   No notable events documented.   Last Vitals:  Vitals:   11/10/20 1252 11/10/20 1302  BP: (!) 138/94 (!) 147/91  Pulse: 73 69  Resp: 13 15  Temp:    SpO2: 99% 97%    Last Pain:  Vitals:   11/10/20 1302  TempSrc:   PainSc: 0-No pain                 VAN STAVEREN,Adream Parzych

## 2020-11-10 NOTE — Anesthesia Procedure Notes (Signed)
Date/Time: 11/10/2020 11:30 AM Performed by: Johnna Acosta, CRNA Pre-anesthesia Checklist: Patient identified, Emergency Drugs available, Suction available, Patient being monitored and Timeout performed Patient Re-evaluated:Patient Re-evaluated prior to induction Oxygen Delivery Method: Nasal cannula Preoxygenation: Pre-oxygenation with 100% oxygen Induction Type: IV induction

## 2020-11-10 NOTE — Transfer of Care (Signed)
Immediate Anesthesia Transfer of Care Note  Patient: Cynthia Sellers  Procedure(s) Performed: COLONOSCOPY WITH PROPOFOL ESOPHAGOGASTRODUODENOSCOPY (EGD)  Patient Location: Endoscopy Unit  Anesthesia Type:General  Level of Consciousness: drowsy  Airway & Oxygen Therapy: Patient Spontanous Breathing  Post-op Assessment: Report given to RN and Post -op Vital signs reviewed and stable  Post vital signs: Reviewed and stable  Last Vitals:  Vitals Value Taken Time  BP    Temp    Pulse 72 11/10/20 1247  Resp 13 11/10/20 1247  SpO2 99 % 11/10/20 1247  Vitals shown include unvalidated device data.  Last Pain:  Vitals:   11/10/20 1242  TempSrc: Temporal  PainSc: 0-No pain         Complications: No notable events documented.

## 2020-11-10 NOTE — H&P (Signed)
Cephas Darby, MD [address]  [place]  [place], [place] [address]  Main: [phone]  Fax: [phone] Pager: [phone]  Primary Care Physician:  Jon Billings, NP Primary Gastroenterologist:  Dr. Cephas Darby  Pre-Procedure History & Physical: HPI:  Cynthia Sellers is a 55 y.o. female is here for an endoscopy and colonoscopy.   Past Medical History:  Diagnosis Date   Anxiety    Complication of anesthesia    Depression    GERD (gastroesophageal reflux disease)    Headache, tension type, episodic    Excedrin tension headache helps   Migraine    H/O MIGRAINES   PONV (postoperative nausea and vomiting)    WITH HYSTERECTOMY   Suicide attempt ([place]) 1992   Took overdose of sleeping pills   Tremor of both hands     Past Surgical History:  Procedure Laterality Date   BILATERAL SALPINGOOPHORECTOMY  01/04/2010   HEEL SPUR EXCISION Left 01/05/2007   LIPOMA EXCISION Left 07/13/2017   Procedure: EXCISION LIPOMA-LEFT BREAST;  Surgeon: Robert Bellow, MD;  Location: [facility] ORS;  Service: General;  Laterality: Left;   TOTAL ABDOMINAL HYSTERECTOMY W/ BILATERAL SALPINGOOPHORECTOMY  01/04/2010    Prior to Admission medications   Medication Sig Start Date End Date Taking? Authorizing Provider  omeprazole (PRILOSEC OTC) 20 MG tablet Take 20 mg by mouth as needed.   Yes [provider]  ARIPiprazole (ABILIFY) 2 MG tablet Take 1 tablet (2 mg total) by mouth daily. 09/22/20   Jon Billings, NP  traZODone (DESYREL) 100 MG tablet Take 100 mg by mouth at bedtime.    [provider]  venlafaxine XR (EFFEXOR XR) 150 MG 24 hr capsule Take 1 capsule (150 mg total) by mouth daily with breakfast. 09/15/20   Jon Billings, NP    Allergies as of 10/24/2020 - Review Complete 10/24/2020  Allergen Reaction Noted   Vicodin [hydrocodone-acetaminophen] Nausea And Vomiting 09/11/2019    Family History  Problem Relation Age of Onset   Heart disease  Father 35   Tremor Father    Cancer Brother        throat and brain cancer   Heart disease Brother        deceased from MI   Tremor Brother    Arthritis Paternal Grandmother    Heart disease Paternal Grandmother    Hypertension Paternal Grandmother    Heart disease Paternal Grandfather    Stroke Paternal Grandfather    Hypertension Paternal Grandfather     Social History   Socioeconomic History   Marital status: Widowed    Spouse name: Not on file   Number of children: 2   Years of education: 10   Highest education level: Not on file  Occupational History    Employer: [place]  Tobacco Use   Smoking status: Never   Smokeless tobacco: Never  Vaping Use   Vaping Use: Never used  Substance and Sexual Activity   Alcohol use: Never   Drug use: Never   Sexual activity: Not Currently    Birth control/protection: Surgical  Other Topics Concern   Not on file  Social History Narrative   Not on file   Social Determinants of Health   Financial Resource Strain: Not on file  Food Insecurity: Not on file  Transportation Needs: Not on file  Physical Activity: Not on file  Stress: Not on file  Social Connections: Not on file  Intimate Partner Violence: Not on file  Review of Systems: See HPI, otherwise negative ROS  Physical Exam: BP (!) 135/102   Pulse 92   Temp (!) 96.8 F (36 C) (Temporal)   Resp 20   Ht 5\' 3"  (1.6 m)   Wt 79.4 kg   SpO2 100%   BMI 31.00 kg/m  General:   Alert,  pleasant and cooperative in NAD Head:  Normocephalic and atraumatic. Neck:  Supple; no masses or thyromegaly. Lungs:  Clear throughout to auscultation.    Heart:  Regular rate and rhythm. Abdomen:  Soft, nontender and nondistended. Normal bowel sounds, without guarding, and without rebound.   Neurologic:  Alert and  oriented x4;  grossly normal neurologically.  Impression/Plan: Cynthia Sellers is here for an endoscopy and colonoscopy to be performed for chronic  gerd and colon cancer screening  Risks, benefits, limitations, and alternatives regarding  endoscopy and colonoscopy have been reviewed with the patient.  Questions have been answered.  All parties agreeable.   Sherri Sear, MD  11/10/2020, 10:35 AM

## 2020-11-10 NOTE — Anesthesia Preprocedure Evaluation (Signed)
Anesthesia Evaluation  Patient identified by MRN, date of birth, ID band Patient awake    Reviewed: Allergy & Precautions, NPO status , Patient's Chart, lab work & pertinent test results  History of Anesthesia Complications (+) PONV  Airway Mallampati: II  TM Distance: >3 FB Neck ROM: full    Dental  (+) Teeth Intact   Pulmonary neg pulmonary ROS,    Pulmonary exam normal breath sounds clear to auscultation       Cardiovascular Exercise Tolerance: Good negative cardio ROS Normal cardiovascular exam Rhythm:Regular Rate:Normal     Neuro/Psych  Headaches, Anxiety Depression negative neurological ROS  negative psych ROS   GI/Hepatic negative GI ROS, Neg liver ROS, GERD  ,  Endo/Other  negative endocrine ROS  Renal/GU negative Renal ROS  negative genitourinary   Musculoskeletal negative musculoskeletal ROS (+)   Abdominal Normal abdominal exam  (+)   Peds negative pediatric ROS (+)  Hematology negative hematology ROS (+)   Anesthesia Other Findings Past Medical History: No date: Anxiety No date: Complication of anesthesia No date: Depression No date: GERD (gastroesophageal reflux disease) No date: Headache, tension type, episodic     Comment:  Excedrin tension headache helps No date: Migraine     Comment:  H/O MIGRAINES No date: PONV (postoperative nausea and vomiting)     Comment:  WITH HYSTERECTOMY 1992: Suicide attempt [facility])     Comment:  Took overdose of sleeping pills No date: Tremor of both hands  Past Surgical History: 01/04/2010: BILATERAL SALPINGOOPHORECTOMY 01/05/2007: HEEL SPUR EXCISION; Left 07/13/2017: LIPOMA EXCISION; Left     Comment:  Procedure: EXCISION LIPOMA-LEFT BREAST;  Surgeon:               Robert Bellow, MD;  Location: [facility] ORS;  Service:               General;  Laterality: Left; 01/04/2010: TOTAL ABDOMINAL HYSTERECTOMY W/ BILATERAL  SALPINGOOPHORECTOMY  BMI    Body Mass  Index: 31.00 kg/m      Reproductive/Obstetrics negative OB ROS                             Anesthesia Physical Anesthesia Plan  ASA: 2  Anesthesia Plan: General   Post-op Pain Management:    Induction:   PONV Risk Score and Plan: 1 and Ondansetron  Airway Management Planned: Nasal Cannula  Additional Equipment:   Intra-op Plan:   Post-operative Plan:   Informed Consent: I have reviewed the patients History and Physical, chart, labs and discussed the procedure including the risks, benefits and alternatives for the proposed anesthesia with the patient or authorized representative who has indicated his/her understanding and acceptance.     Dental Advisory Given  Plan Discussed with: CRNA and Surgeon  Anesthesia Plan Comments:         Anesthesia Quick Evaluation

## 2020-11-10 NOTE — Op Note (Signed)
[facility] Gastroenterology Patient Name: Cynthia Sellers Procedure Date: 11/10/2020 11:48 AM MRN: [id number] Account #: [id number] Date of Birth: [birth year] Admit Type: Outpatient Age: 55 Room: [facility] ENDO ROOM 1 Gender: Female Note Status: Finalized Instrument Name: Park Meo [id number] Procedure:             Colonoscopy Indications:           Screening for colorectal malignant neoplasm, This is                         the patient's first colonoscopy Providers:             Lin Landsman MD, MD Referring MD:          Jon Billings (Referring MD) Medicines:             General Anesthesia Complications:         No immediate complications. Estimated blood loss: None. Procedure:             Pre-Anesthesia Assessment:                        - Prior to the procedure, a History and Physical was                         performed, and patient medications and allergies were                         reviewed. The patient is competent. The risks and                         benefits of the procedure and the sedation options and                         risks were discussed with the patient. All questions                         were answered and informed consent was obtained.                         Patient identification and proposed procedure were                         verified by the physician, the nurse, the                         anesthesiologist, the anesthetist and the technician                         in the pre-procedure area in the procedure room in the                         endoscopy suite. Mental Status Examination: alert and                         oriented. Airway Examination: normal oropharyngeal                         airway and neck mobility. Respiratory Examination:  clear to auscultation. CV Examination: normal.                         Prophylactic Antibiotics: The patient does not require                          prophylactic antibiotics. Prior Anticoagulants: The                         patient has taken no previous anticoagulant or                         antiplatelet agents. ASA Grade Assessment: II - A                         patient with mild systemic disease. After reviewing                         the risks and benefits, the patient was deemed in                         satisfactory condition to undergo the procedure. The                         anesthesia plan was to use general anesthesia.                         Immediately prior to administration of medications,                         the patient was re-assessed for adequacy to receive                         sedatives. The heart rate, respiratory rate, oxygen                         saturations, blood pressure, adequacy of pulmonary                         ventilation, and response to care were monitored                         throughout the procedure. The physical status of the                         patient was re-assessed after the procedure.                        After obtaining informed consent, the colonoscope was                         passed under direct vision. Throughout the procedure,                         the patient's blood pressure, pulse, and oxygen                         saturations were monitored continuously. The  Colonoscope was introduced through the anus and                         advanced to the the cecum, identified by appendiceal                         orifice and ileocecal valve. The colonoscopy was                         performed without difficulty. The patient tolerated                         the procedure well. The quality of the bowel                         preparation was evaluated using the BBPS [facility] Bowel                         Preparation Scale) with scores of: Right Colon = 3,                         Transverse Colon = 3 and Left Colon = 3 (entire mucosa                          seen well with no residual staining, small fragments                         of stool or opaque liquid). The total BBPS score                         equals 9. Findings:      The perianal and digital rectal examinations were normal. Pertinent       negatives include normal sphincter tone and no palpable rectal lesions.      A 12 mm polyp was found in the cecum. The polyp was sessile.       Preparations were made for mucosal resection. Eleview was injected to       raise the lesion. Snare mucosal resection was performed. Resection was       complete, but the tissue was not retrieved as the trap was not       connected. To prevent bleeding after mucosal resection, two hemostatic       clips were successfully placed (MR conditional). There was no bleeding       during, or at the end, of the procedure. Estimated blood loss: none.      A 4 mm polyp was found in the descending colon. The polyp was sessile.       The polyp was removed with a cold snare. Resection and retrieval were       complete.      Multiple diverticula were found in the sigmoid colon. There was       narrowing of the colon in association with the diverticular opening.       There was no evidence of diverticular bleeding.      Non-bleeding external hemorrhoids were found during endoscopy. The       hemorrhoids were medium-sized.      Unable to perform retroflexion due to short rectum Impression:            -  One 12 mm polyp in the cecum, removed with mucosal                         resection. Complete resection. Polyp tissue not                         retrieved. Clips (MR conditional) were placed.                        - One 4 mm polyp in the descending colon, removed with                         a cold snare. Resected and retrieved.                        - Severe diverticulosis in the sigmoid colon. There                         was narrowing of the colon in association with the                          diverticular opening. There was no evidence of                         diverticular bleeding.                        - Non-bleeding external hemorrhoids.                        - Mucosal resection was performed. Resection was                         complete, but the tissue was not retrieved. Recommendation:        - Discharge patient to home (with escort).                        - Resume previous diet today.                        - Continue present medications.                        - Await pathology results.                        - Repeat colonoscopy in 3 years for surveillance. Procedure Code(s):     --- Professional ---                        [phone], Colonoscopy, flexible; with endoscopic mucosal                         resection                        [id number], 55, Colonoscopy, flexible; with removal of                         tumor(s), polyp(s), or other lesion(s) by snare  technique Diagnosis Code(s):     --- Professional ---                        Z12.11, Encounter for screening for malignant neoplasm                         of colon                        K63.5, Polyp of colon                        K64.4, Residual hemorrhoidal skin tags                        K57.30, Diverticulosis of large intestine without                         perforation or abscess without bleeding CPT copyright 2019 American Medical Association. All rights reserved. The codes documented in this report are preliminary and upon coder review may  be revised to meet current compliance requirements. Dr. Ulyess Mort Lin Landsman MD, MD 11/10/2020 12:44:40 PM This report has been signed electronically. Number of Addenda: 0 Note Initiated On: 11/10/2020 11:48 AM Scope Withdrawal Time: 0 hours 20 minutes 22 seconds  Total Procedure Duration: 0 hours 27 minutes 47 seconds  Estimated Blood Loss:  Estimated blood loss: none.      [facility]

## 2020-11-10 NOTE — Op Note (Signed)
[facility] Gastroenterology Patient Name: Cynthia Sellers Procedure Date: 11/10/2020 11:50 AM MRN: [id number] Account #: [id number] Date of Birth: [birth year] Admit Type: Outpatient Age: 55 Room: [facility] ENDO ROOM 1 Gender: Female Note Status: Finalized Instrument Name: Upper Endoscope [id number] Procedure:             Upper GI endoscopy Indications:           Esophageal reflux symptoms that persist despite                         appropriate therapy Providers:             Lin Landsman MD, MD Referring MD:          Jon Billings (Referring MD) Medicines:             General Anesthesia Complications:         No immediate complications. Estimated blood loss: None. Procedure:             Pre-Anesthesia Assessment:                        - Prior to the procedure, a History and Physical was                         performed, and patient medications and allergies were                         reviewed. The patient is competent. The risks and                         benefits of the procedure and the sedation options and                         risks were discussed with the patient. All questions                         were answered and informed consent was obtained.                         Patient identification and proposed procedure were                         verified by the physician, the nurse, the                         anesthesiologist, the anesthetist and the technician                         in the pre-procedure area in the procedure room in the                         endoscopy suite. Mental Status Examination: alert and                         oriented. Airway Examination: normal oropharyngeal                         airway and neck mobility. Respiratory Examination:  clear to auscultation. CV Examination: normal.                         Prophylactic Antibiotics: The patient does not require                         prophylactic  antibiotics. Prior Anticoagulants: The                         patient has taken no previous anticoagulant or                         antiplatelet agents. ASA Grade Assessment: II - A                         patient with mild systemic disease. After reviewing                         the risks and benefits, the patient was deemed in                         satisfactory condition to undergo the procedure. The                         anesthesia plan was to use general anesthesia.                         Immediately prior to administration of medications,                         the patient was re-assessed for adequacy to receive                         sedatives. The heart rate, respiratory rate, oxygen                         saturations, blood pressure, adequacy of pulmonary                         ventilation, and response to care were monitored                         throughout the procedure. The physical status of the                         patient was re-assessed after the procedure.                        After obtaining informed consent, the endoscope was                         passed under direct vision. Throughout the procedure,                         the patient's blood pressure, pulse, and oxygen                         saturations were monitored continuously. The Endoscope  was introduced through the mouth, and advanced to the                         second part of duodenum. The upper GI endoscopy was                         accomplished without difficulty. The patient tolerated                         the procedure well. Findings:      The duodenal bulb and second portion of the duodenum were normal.      Multiple dispersed 3 mm erosions with no bleeding and no stigmata of       recent bleeding were found in the gastric antrum. Biopsies were taken       with a cold forceps for histology.      A small hiatal hernia was present.      Esophagogastric  landmarks were identified: the gastroesophageal junction       was found at 33 cm from the incisors.      The gastroesophageal junction and examined esophagus were normal. Impression:            - Normal duodenal bulb and second portion of the                         duodenum.                        - Erosive gastropathy with no bleeding and no stigmata                         of recent bleeding. Biopsied.                        - Small hiatal hernia.                        - Esophagogastric landmarks identified.                        - Normal gastroesophageal junction and esophagus. Recommendation:        - Await pathology results.                        - Follow an antireflux regimen indefinitely.                        - Proceed with colonoscopy as scheduled                        See colonoscopy report                        - Continue present medications.                        - Use a proton pump inhibitor PO BID for 3 months. Procedure Code(s):     --- Professional ---                        [id number], Esophagogastroduodenoscopy, flexible,  transoral; with biopsy, single or multiple Diagnosis Code(s):     --- Professional ---                        K31.89, Other diseases of stomach and duodenum                        K44.9, Diaphragmatic hernia without obstruction or                         gangrene                        K21.9, Gastro-esophageal reflux disease without                         esophagitis CPT copyright 2019 American Medical Association. All rights reserved. The codes documented in this report are preliminary and upon coder review may  be revised to meet current compliance requirements. Dr. Ulyess Mort Lin Landsman MD, MD 11/10/2020 12:07:26 PM This report has been signed electronically. Number of Addenda: 0 Note Initiated On: 11/10/2020 11:50 AM Estimated Blood Loss:  Estimated blood loss: none.      [facility]

## 2020-11-11 ENCOUNTER — Encounter: Payer: Self-pay | Admitting: Gastroenterology

## 2020-11-12 ENCOUNTER — Encounter: Payer: Self-pay | Admitting: Gastroenterology

## 2020-11-12 LAB — SURGICAL PATHOLOGY

## 2020-12-14 NOTE — Progress Notes (Signed)
BP (!) 149/94 (BP Location: Right Arm, Cuff Size: Normal)   Pulse 74   Temp 98 F (36.7 C) (Oral)   Wt 183 lb 3.2 oz (83.1 kg)   SpO2 98%   BMI 32.45 kg/m    Subjective:    Patient ID: Cynthia Sellers, female    DOB: Jul 02, 1965, 54 y.o.   MRN: [id number]  HPI: Cynthia Sellers is a 55 y.o. female  Chief Complaint  Patient presents with   Depression   ANXIETY/DEPRESSION Patient states that her symptoms are about the same as they were before.  She states the time of the year is very difficult.  She is sleeping better. The Abilify is helping with her sleep.  Wondering if she can increase her medication to help with her depression/anxiety.  GAD 7 : Generalized Anxiety Score 12/15/2020 09/15/2020 07/28/2020 07/03/2020  Nervous, Anxious, on Edge 3 3 3 3   Control/stop worrying 3 3 3 3   Worry too much - different things 3 3 3 3   Trouble relaxing 1 1 0 0  Restless 0 0 0 0  Easily annoyed or irritable 0 0 2 1  Afraid - awful might happen 0 0 0 0  Total GAD 7 Score 10 10 11 10   Anxiety Difficulty Not difficult at all Not difficult at all Somewhat difficult Not difficult at all    [facility] Visit from 12/15/2020 in [place]  PHQ-9 Total Score 8          Relevant past medical, surgical, family and social history reviewed and updated as indicated. Interim medical history since our last visit reviewed. Allergies and medications reviewed and updated.  Review of Systems  Psychiatric/Behavioral:  Positive for dysphoric mood. Negative for sleep disturbance and suicidal ideas. The patient is nervous/anxious.    Per HPI unless specifically indicated above     Objective:    BP (!) 149/94 (BP Location: Right Arm, Cuff Size: Normal)   Pulse 74   Temp 98 F (36.7 C) (Oral)   Wt 183 lb 3.2 oz (83.1 kg)   SpO2 98%   BMI 32.45 kg/m   Wt Readings from Last 3 Encounters:  12/15/20 183 lb 3.2 oz (83.1 kg)  11/10/20 175 lb (79.4 kg)  10/24/20 179 lb  (81.2 kg)    Physical Exam Vitals and nursing note reviewed.  Constitutional:      General: She is not in acute distress.    Appearance: Normal appearance. She is normal weight. She is not ill-appearing, toxic-appearing or diaphoretic.  HENT:     Head: Normocephalic.     Right Ear: External ear normal.     Left Ear: External ear normal.     Nose: Nose normal.     Mouth/Throat:     Mouth: Mucous membranes are moist.     Pharynx: Oropharynx is clear.  Eyes:     General:        Right eye: No discharge.        Left eye: No discharge.     Extraocular Movements: Extraocular movements intact.     Conjunctiva/sclera: Conjunctivae normal.     Pupils: Pupils are equal, round, and reactive to light.  Cardiovascular:     Rate and Rhythm: Normal rate and regular rhythm.     Heart sounds: No murmur heard. Pulmonary:     Effort: Pulmonary effort is normal. No respiratory distress.     Breath sounds: Normal breath sounds. No wheezing or rales.  Musculoskeletal:  Cervical back: Normal range of motion and neck supple.  Skin:    General: Skin is warm and dry.     Capillary Refill: Capillary refill takes less than 2 seconds.  Neurological:     General: No focal deficit present.     Mental Status: She is alert and oriented to person, place, and time. Mental status is at baseline.  Psychiatric:        Mood and Affect: Mood normal.        Behavior: Behavior normal.        Thought Content: Thought content normal.        Judgment: Judgment normal.    Results for orders placed or performed during the hospital encounter of 11/10/20  Surgical pathology  Result Value Ref Range   SURGICAL PATHOLOGY      SURGICAL PATHOLOGY CASE: [id number] PATIENT: Cynthia Sellers Surgical Pathology Report     Specimen Submitted: A. Stomach erosions; cbx B. Colon polyp, descending; cold snare  Clinical History: Chronic GERD K21.9.  Colon cancer screening Z12.11. Gastric ulcer, gastric erythema,  hiatal hernia, gastric erosions, polyps, sigmoid diverticulosis      DIAGNOSIS: A. STOMACH EROSIONS; COLD BIOPSY: - CHRONIC AND FOCALLY ACTIVE GASTRITIS. - NEGATIVE FOR HELICOBACTER PYLORI BY IMMUNOHISTOCHEMISTRY. - NEGATIVE FOR INTESTINAL METAPLASIA, DYSPLASIA, AND MALIGNANCY.  B. COLON POLYP, DESCENDING; COLD SNARE: - COLONIC MUCOSA WITH INTRAMUCOSAL LYMPHOID AGGREGATES. - NEGATIVE FOR DYSPLASIA AND MALIGNANCY.  Comment: Immunohistochemical stain for H pylori (block A1) is negative.  IHC slides were prepared by [facility], [place]. All controls stained appropriately.  This test was developed and its performance characteristics determined by [facility]. It has not been c leared or approved by the [place] Food and Drug Administration. The FDA does not require this test to go through premarket FDA review. This test is used for clinical purposes. It should not be regarded as investigational or for research. This laboratory is certified under the Clinical Laboratory Improvement Amendments (CLIA) as qualified to perform high complexity clinical laboratory testing.    GROSS DESCRIPTION: A. Labeled: cbx gastric erosions rule out H. pylori Received: Formalin Collection time: 11:59 AM 11/10/2020 Placed into formalin time: 11:59 AM on 11/10/2020 Tissue fragment(s): 3 Size: Aggregate, 0.6 x 0.5 x 0.2 cm Description: Tan soft tissue fragments Entirely submitted in 1 cassette.  B. Labeled: Descending colon polyp cold snare Received: Formalin Collection time: 12:34 PM on 11/10/2020 Placed into formalin time: 12:34 PM on 11/10/2020 Tissue fragment(s): 2 Size: Range from 0.5-0.8 cm Description: Tan soft tissue fragments Entirely submitted in 1 cassett e.  RB 11/10/2020   Final Diagnosis performed by Betsy Pries, MD.   Electronically signed 11/12/2020 1:41:38PM The electronic signature indicates that the named Attending Pathologist has evaluated the specimen Technical component  performed at [facility], [address], [place], [place] [address] Lab: [phone] Dir: Rush Farmer, MD, MMM  Professional component performed at [facility], [facility], [place], [place], [place] [address] Lab: [phone] Dir: Kathi Simpers, MD       Assessment & Plan:   Problem List Items Addressed This Visit       Other   Anxiety - Primary    Chronic. Ongoing.  Will increase Effexor to 225mg  daily.  Discussed how to properly take the medication. Continue with Abilify.  Follow up in 1 month for reevaluation.      Relevant Medications   venlafaxine XR (EFFEXOR XR) 75 MG 24 hr capsule   Elevated BP without diagnosis of hypertension  Patient is hesitant to start medication. Discussed her trend over the last several months. Will recheck in one month and see if blood pressure is improved. If not, will discuss medication again. Follow up in 1 month.      Depression, major, recurrent, mild (HCC)    Chronic. Ongoing.  Will increase Effexor to 225mg  daily.  Discussed how to properly take the medication. Continue with Abilify.  Follow up in 1 month for reevaluation.      Relevant Medications   venlafaxine XR (EFFEXOR XR) 75 MG 24 hr capsule   Other Visit Diagnoses     Need for shingles vaccine       Tremor       Referral placed for patient to see Neurology.   Relevant Orders   Ambulatory referral to Neurology        Follow up plan: Return in about 1 month (around 01/15/2021) for Depression/Anxiety FU, BP Check.

## 2020-12-15 ENCOUNTER — Ambulatory Visit (INDEPENDENT_AMBULATORY_CARE_PROVIDER_SITE_OTHER): Payer: 59 | Admitting: Nurse Practitioner

## 2020-12-15 ENCOUNTER — Encounter: Payer: Self-pay | Admitting: Nurse Practitioner

## 2020-12-15 ENCOUNTER — Other Ambulatory Visit: Payer: Self-pay

## 2020-12-15 ENCOUNTER — Other Ambulatory Visit: Payer: Self-pay | Admitting: Nurse Practitioner

## 2020-12-15 VITALS — BP 149/94 | HR 74 | Temp 98.0°F | Wt 183.2 lb

## 2020-12-15 DIAGNOSIS — R030 Elevated blood-pressure reading, without diagnosis of hypertension: Secondary | ICD-10-CM

## 2020-12-15 DIAGNOSIS — Z23 Encounter for immunization: Secondary | ICD-10-CM | POA: Diagnosis not present

## 2020-12-15 DIAGNOSIS — F419 Anxiety disorder, unspecified: Secondary | ICD-10-CM | POA: Diagnosis not present

## 2020-12-15 DIAGNOSIS — F330 Major depressive disorder, recurrent, mild: Secondary | ICD-10-CM | POA: Diagnosis not present

## 2020-12-15 DIAGNOSIS — R251 Tremor, unspecified: Secondary | ICD-10-CM

## 2020-12-15 MED ORDER — VENLAFAXINE HCL ER 75 MG PO CP24: 75.0000 mg | ORAL_CAPSULE | Freq: Every day | ORAL | 1 refills | Status: DC

## 2020-12-15 MED ORDER — ARIPIPRAZOLE 2 MG PO TABS: 2.0000 mg | ORAL_TABLET | Freq: Every day | ORAL | 1 refills | Status: DC

## 2020-12-15 NOTE — Assessment & Plan Note (Signed)
Chronic. Ongoing.  Will increase Effexor to 225mg  daily.  Discussed how to properly take the medication. Continue with Abilify.  Follow up in 1 month for reevaluation.

## 2020-12-15 NOTE — Telephone Encounter (Signed)
Requested medications are due for refill today.  no  Requested medications are on the active medications list.  no  Last refill. 09/15/2020  Future visit scheduled.   yes  Notes to clinic.  Medication was discontinued on 09/22/2020    Requested Prescriptions  Pending Prescriptions Disp Refills   QUEtiapine (SEROQUEL) 25 MG tablet [Pharmacy Med Name: QUEtiapine Fumarate 25 MG Oral Tablet] 60 tablet 0    Sig: TAKE 1 TABLET BY MOUTH AT BEDTIME     Not Delegated - Psychiatry:  Antipsychotics - Second Generation (Atypical) - quetiapine Failed - 12/15/2020  2:40 PM      Failed - This refill cannot be delegated      Failed - Last BP in normal range    BP Readings from Last 1 Encounters:  12/15/20 (!) 149/94          Passed - ALT in normal range and within 180 days    ALT  Date Value Ref Range Status  09/15/2020 6 0 - 32 IU/L Final   SGPT (ALT)  Date Value Ref Range Status  01/31/2012 16 12 - 78 U/L Final          Passed - AST in normal range and within 180 days    AST  Date Value Ref Range Status  09/15/2020 11 0 - 40 IU/L Final   SGOT(AST)  Date Value Ref Range Status  01/31/2012 18 15 - 37 Unit/L Final          Passed - Completed PHQ-2 or PHQ-9 in the last 360 days      Passed - Valid encounter within last 6 months    Recent Outpatient Visits           Today [place], NP   3 months ago Annual physical exam   [place], NP   4 months ago Depression with anxiety   [place], NP   5 months ago Depression, major, recurrent, mild ([place])   [facility] Jon Billings, NP   1 year ago Familial tremor   [place], Devonne Doughty, DO       Future Appointments             In 1 month Jon Billings, NP [facility] [place]   In 2 months Vanga, Tally Due, MD [facility]

## 2020-12-15 NOTE — Assessment & Plan Note (Signed)
Patient is hesitant to start medication. Discussed her trend over the last several months. Will recheck in one month and see if blood pressure is improved. If not, will discuss medication again. Follow up in 1 month.

## 2020-12-30 ENCOUNTER — Ambulatory Visit: Payer: Self-pay

## 2020-12-30 NOTE — Telephone Encounter (Signed)
Chief Complaint: COVID positive on 12/29/20 Symptoms: Started on 12/23/20 Fever 102 x 24 hours. Now stuffy head, cough is better, scratchy throat Frequency: Symptoms improving since 12/23/20 Pertinent Negatives: Patient denies SOB, CP Disposition: [] ED /[] Urgent Care (no appt availability in office) / [x] Appointment(In office/virtual)/ []  [facility] [facility]/ [] Home Care/ [] Refused Recommended Disposition  Additional Notes: N/A    Summary: COVID Positive   Coughing, hoarse, patient was dx with COVID on 12/29/2020. Caller declined appointment      Reason for Disposition  [1] Fever returns after gone for over 24 hours AND [2] symptoms worse or not improved  Answer Assessment - Initial Assessment Questions 1. COVID-19 DIAGNOSIS: "Who made your COVID-19 diagnosis?" "Was it confirmed by a positive lab test or self-test?" If not diagnosed by a doctor (or NP/PA), ask "Are there lots of cases (community spread) where you live?" Note: See public health department website, if unsure.     Home test on 12/29/20 2. COVID-19 EXPOSURE: "Was there any known exposure to COVID before the symptoms began?" CDC Definition of close contact: within 6 feet (2 meters) for a total of 15 minutes or more over a 24-hour period.      No 3. ONSET: "When did the COVID-19 symptoms start?"      Last Tuesday 12/23/20 Temp 102.0 x 1 day 4. WORST SYMPTOM: "What is your worst symptom?" (e.g., cough, fever, shortness of breath, muscle aches)     Stuffy head, hoarse 5. COUGH: "Do you have a cough?" If Yes, ask: "How bad is the cough?"       Yes, but better 6. FEVER: "Do you have a fever?" If Yes, ask: "What is your temperature, how was it measured, and when did it start?"     No 7. RESPIRATORY STATUS: "Describe your breathing?" (e.g., shortness of breath, wheezing, unable to speak)      No 8. BETTER-SAME-WORSE: "Are you getting better, staying the same or getting worse compared to yesterday?"  If getting worse, ask, "In  what way?"     Same 9. HIGH RISK DISEASE: "Do you have any chronic medical problems?" (e.g., asthma, heart or lung disease, weak immune system, obesity, etc.)     No 10. VACCINE: "Have you had the COVID-19 vaccine?" If Yes, ask: "Which one, how many shots, when did you get it?"       Just 1 pfizer 11. BOOSTER: "Have you received your COVID-19 booster?" If Yes, ask: "Which one and when did you get it?"       No 12. PREGNANCY: "Is there any chance you are pregnant?" "When was your last menstrual period?"       N/A 13. OTHER SYMPTOMS: "Do you have any other symptoms?"  (e.g., chills, fatigue, headache, loss of smell or taste, muscle pain, sore throat)       Stuffy head at night, hoarse at night, loss of taste, sore throat 14. O2 SATURATION MONITOR:  "Do you use an oxygen saturation monitor (pulse oximeter) at home?" If Yes, ask "What is your reading (oxygen level) today?" "What is your usual oxygen saturation reading?" (e.g., 95%)       N/A  Protocols used: Coronavirus (COVID-19) Diagnosed or Suspected-A-AH

## 2020-12-31 ENCOUNTER — Encounter: Payer: Self-pay | Admitting: Family Medicine

## 2020-12-31 ENCOUNTER — Telehealth (INDEPENDENT_AMBULATORY_CARE_PROVIDER_SITE_OTHER): Payer: 59 | Admitting: Family Medicine

## 2020-12-31 ENCOUNTER — Telehealth: Payer: Self-pay | Admitting: Nurse Practitioner

## 2020-12-31 DIAGNOSIS — U071 COVID-19: Secondary | ICD-10-CM

## 2020-12-31 MED ORDER — HYDROCOD POLST-CPM POLST ER 10-8 MG/5ML PO SUER: 5.0000 mL | Freq: Two times a day (BID) | ORAL | 0 refills | Status: DC | PRN

## 2020-12-31 NOTE — Progress Notes (Signed)
There were no vitals taken for this visit.   Subjective:    Patient ID: Cynthia Sellers, female    DOB: [birth year], 55 y.o.   MRN: [id number]  HPI: Cynthia Sellers is a 55 y.o. female  Chief Complaint  Patient presents with   Covid Positive    Patient states she tested positive for COVID on 12/26. Patient states her symptoms began last Tuesday, she had cough, sneezing and fever for one day. Patient symptoms have gotten better, is only have a scratchy throat now.    UPPER RESPIRATORY TRACT INFECTION Duration: 8 days ago Worst symptom: scratchy throat Fever: no Cough: no Shortness of breath: no Wheezing: no Chest pain: no Chest tightness: no Chest congestion: no Nasal congestion: no Runny nose: no Post nasal drip: no Sneezing: no Sore throat: yes Swollen glands: no Sinus pressure: no Headache: no Face pain: no Toothache: no Ear pain: no  Ear pressure: no  Eyes red/itching:no Eye drainage/crusting: no  Vomiting: no Rash: no Fatigue: no Sick contacts: no Strep contacts: no  Context: better Recurrent sinusitis: no Relief with OTC cold/cough medications: no  Treatments attempted: none   Relevant past medical, surgical, family and social history reviewed and updated as indicated. Interim medical history since our last visit reviewed. Allergies and medications reviewed and updated.  Review of Systems  Constitutional: Negative.   HENT:  Positive for sore throat. Negative for congestion, dental problem, drooling, ear discharge, ear pain, facial swelling, hearing loss, mouth sores, nosebleeds, postnasal drip, rhinorrhea, sinus pressure, sinus pain, sneezing, tinnitus, trouble swallowing and voice change.   Respiratory: Negative.    Cardiovascular: Negative.   Musculoskeletal: Negative.   Psychiatric/Behavioral: Negative.     Per HPI unless specifically indicated above     Objective:    There were no vitals taken for this visit.  Wt Readings from Last 3  Encounters:  12/15/20 183 lb 3.2 oz (83.1 kg)  11/10/20 175 lb (79.4 kg)  10/24/20 179 lb (81.2 kg)    Physical Exam Vitals and nursing note reviewed.  Pulmonary:     Effort: Pulmonary effort is normal. No respiratory distress.     Comments: Speaking in full sentences Neurological:     Mental Status: She is alert.  Psychiatric:        Mood and Affect: Mood normal.        Behavior: Behavior normal.        Thought Content: Thought content normal.        Judgment: Judgment normal.    Results for orders placed or performed during the hospital encounter of 11/10/20  Surgical pathology  Result Value Ref Range   SURGICAL PATHOLOGY      SURGICAL PATHOLOGY CASE: [id number] PATIENT: Cynthia Sellers Surgical Pathology Report     Specimen Submitted: A. Stomach erosions; cbx B. Colon polyp, descending; cold snare  Clinical History: Chronic GERD K21.9.  Colon cancer screening Z12.11. Gastric ulcer, gastric erythema, hiatal hernia, gastric erosions, polyps, sigmoid diverticulosis      DIAGNOSIS: A. STOMACH EROSIONS; COLD BIOPSY: - CHRONIC AND FOCALLY ACTIVE GASTRITIS. - NEGATIVE FOR HELICOBACTER PYLORI BY IMMUNOHISTOCHEMISTRY. - NEGATIVE FOR INTESTINAL METAPLASIA, DYSPLASIA, AND MALIGNANCY.  B. COLON POLYP, DESCENDING; COLD SNARE: - COLONIC MUCOSA WITH INTRAMUCOSAL LYMPHOID AGGREGATES. - NEGATIVE FOR DYSPLASIA AND MALIGNANCY.  Comment: Immunohistochemical stain for H pylori (block A1) is negative.  IHC slides were prepared by Launa Grill, [place]. All controls stained appropriately.  This test was developed and its performance characteristics determined by [facility].  It has not been c leared or approved by the [place] Food and Drug Administration. The FDA does not require this test to go through premarket FDA review. This test is used for clinical purposes. It should not be regarded as investigational or for research. This laboratory is certified under the Clinical  Laboratory Improvement Amendments (CLIA) as qualified to perform high complexity clinical laboratory testing.    GROSS DESCRIPTION: A. Labeled: cbx gastric erosions rule out H. pylori Received: Formalin Collection time: 11:59 AM 11/10/2020 Placed into formalin time: 11:59 AM on 11/10/2020 Tissue fragment(s): 3 Size: Aggregate, 0.6 x 0.5 x 0.2 cm Description: Tan soft tissue fragments Entirely submitted in 1 cassette.  B. Labeled: Descending colon polyp cold snare Received: Formalin Collection time: 12:34 PM on 11/10/2020 Placed into formalin time: 12:34 PM on 11/10/2020 Tissue fragment(s): 2 Size: Range from 0.5-0.8 cm Description: Tan soft tissue fragments Entirely submitted in 1 cassett e.  RB 11/10/2020   Final Diagnosis performed by Betsy Pries, MD.   Electronically signed 11/12/2020 1:41:38PM The electronic signature indicates that the named Attending Pathologist has evaluated the specimen Technical component performed at [facility], [address], [place], [place] [address] Lab: [phone] Dir: Rush Farmer, MD, MMM  Professional component performed at [facility], [facility], [place], [place], [place] [address] Lab: [phone] Dir: Kathi Simpers, MD       Assessment & Plan:   Problem List Items Addressed This Visit   None Visit Diagnoses     COVID-19    -  Primary   Resolving. OK to go out on 12/30. Tussionex PRN. Call with any concerns. Continue to monitor.         Follow up plan: Return if symptoms worsen or fail to improve.    This visit was completed via telephone due to the restrictions of the COVID-19 pandemic. All issues as above were discussed and addressed but no physical exam was performed. If it was felt that the patient should be evaluated in the office, they were directed there. The patient verbally consented to this visit. Patient was unable to complete an audio/visual visit due to Lack of equipment. Due to the  catastrophic nature of the COVID-19 pandemic, this visit was done through audio contact only. Location of the patient: home Location of the provider: home Those involved with this call:  Provider: Park Liter, DO CMA: Louanna Raw, CMA Front Desk/Registration: [employer]  Time spent on call:  15 minutes on the phone discussing health concerns. 23 minutes total spent in review of patient's record and preparation of their chart.

## 2020-12-31 NOTE — Telephone Encounter (Signed)
Copied from [place] [id number]. Topic: General - Other >> Dec 31, 2020 12:27 PM Yvette Rack wrote: Reason for CRM: [place] requests new Rx for chlorpheniramine-HYDROcodone [facility] ER) 10-8 MG/5ML SUER due to it coming packaged as 70 ML.

## 2021-01-01 MED ORDER — HYDROCOD POLST-CPM POLST ER 10-8 MG/5ML PO SUER
5.0000 mL | Freq: Two times a day (BID) | ORAL | 0 refills | Status: DC | PRN
Start: 2021-01-01 — End: 2021-01-19

## 2021-01-01 NOTE — Addendum Note (Signed)
Addended by: Valerie Roys on: 01/01/2021 02:35 PM   Modules accepted: Orders

## 2021-01-16 NOTE — Progress Notes (Signed)
BP 122/81 (BP Location: Left Arm, Cuff Size: Normal)    Pulse 83    Temp 98.5 F (36.9 C) (Oral)    Wt 180 lb 9.6 oz (81.9 kg)    SpO2 97%    BMI 31.99 kg/m    Subjective:    Patient ID: Cynthia Sellers, female    DOB: December 11, 1965, 56 y.o.   MRN: [id number]  HPI: Cynthia Sellers is a 56 y.o. female  Chief Complaint  Patient presents with   Anxiety   Depression   ANXIETY/DEPRESSION Patient states that the effexor was making her anxiety worse.  She was only taking 75mg  and it was making her more anxious. She would also like to go back on the Seroquel since it helps her sleep.  She has taken buspar in the past to help with anxiety and wondering if she can get a prescription today.  Denies SI.  GAD 7 : Generalized Anxiety Score 01/19/2021 12/15/2020 09/15/2020 07/28/2020  Nervous, Anxious, on Edge 2 3 3 3   Control/stop worrying 3 3 3 3   Worry too much - different things 3 3 3 3   Trouble relaxing 1 1 1  0  Restless 0 0 0 0  Easily annoyed or irritable 3 0 0 2  Afraid - awful might happen 1 0 0 0  Total GAD 7 Score 13 10 10 11   Anxiety Difficulty Somewhat difficult Not difficult at all Not difficult at all Somewhat difficult    [place] Visit from 01/19/2021 in [place]  PHQ-9 Total Score 7          Relevant past medical, surgical, family and social history reviewed and updated as indicated. Interim medical history since our last visit reviewed. Allergies and medications reviewed and updated.  Review of Systems  Psychiatric/Behavioral:  Positive for dysphoric mood. Negative for sleep disturbance and suicidal ideas. The patient is nervous/anxious.    Per HPI unless specifically indicated above     Objective:    BP 122/81 (BP Location: Left Arm, Cuff Size: Normal)    Pulse 83    Temp 98.5 F (36.9 C) (Oral)    Wt 180 lb 9.6 oz (81.9 kg)    SpO2 97%    BMI 31.99 kg/m   Wt Readings from Last 3 Encounters:  01/19/21 180 lb 9.6 oz (81.9 kg)   12/15/20 183 lb 3.2 oz (83.1 kg)  11/10/20 175 lb (79.4 kg)    Physical Exam Vitals and nursing note reviewed.  Constitutional:      General: She is not in acute distress.    Appearance: Normal appearance. She is normal weight. She is not ill-appearing, toxic-appearing or diaphoretic.  HENT:     Head: Normocephalic.     Right Ear: External ear normal.     Left Ear: External ear normal.     Nose: Nose normal.     Mouth/Throat:     Mouth: Mucous membranes are moist.     Pharynx: Oropharynx is clear.  Eyes:     General:        Right eye: No discharge.        Left eye: No discharge.     Extraocular Movements: Extraocular movements intact.     Conjunctiva/sclera: Conjunctivae normal.     Pupils: Pupils are equal, round, and reactive to light.  Cardiovascular:     Rate and Rhythm: Normal rate and regular rhythm.     Heart sounds: No murmur heard. Pulmonary:  Effort: Pulmonary effort is normal. No respiratory distress.     Breath sounds: Normal breath sounds. No wheezing or rales.  Musculoskeletal:     Cervical back: Normal range of motion and neck supple.  Skin:    General: Skin is warm and dry.     Capillary Refill: Capillary refill takes less than 2 seconds.  Neurological:     General: No focal deficit present.     Mental Status: She is alert and oriented to person, place, and time. Mental status is at baseline.  Psychiatric:        Mood and Affect: Mood normal.        Behavior: Behavior normal.        Thought Content: Thought content normal.        Judgment: Judgment normal.    Results for orders placed or performed during the hospital encounter of 11/10/20  Surgical pathology  Result Value Ref Range   SURGICAL PATHOLOGY      SURGICAL PATHOLOGY CASE: [id number] PATIENT: Carmine Savoy Surgical Pathology Report     Specimen Submitted: A. Stomach erosions; cbx B. Colon polyp, descending; cold snare  Clinical History: Chronic GERD K21.9.  Colon cancer  screening Z12.11. Gastric ulcer, gastric erythema, hiatal hernia, gastric erosions, polyps, sigmoid diverticulosis      DIAGNOSIS: A. STOMACH EROSIONS; COLD BIOPSY: - CHRONIC AND FOCALLY ACTIVE GASTRITIS. - NEGATIVE FOR HELICOBACTER PYLORI BY IMMUNOHISTOCHEMISTRY. - NEGATIVE FOR INTESTINAL METAPLASIA, DYSPLASIA, AND MALIGNANCY.  B. COLON POLYP, DESCENDING; COLD SNARE: - COLONIC MUCOSA WITH INTRAMUCOSAL LYMPHOID AGGREGATES. - NEGATIVE FOR DYSPLASIA AND MALIGNANCY.  Comment: Immunohistochemical stain for H pylori (block A1) is negative.  IHC slides were prepared by Launa Grill, [place]. All controls stained appropriately.  This test was developed and its performance characteristics determined by [facility]. It has not been c leared or approved by the [place] Food and Drug Administration. The FDA does not require this test to go through premarket FDA review. This test is used for clinical purposes. It should not be regarded as investigational or for research. This laboratory is certified under the Clinical Laboratory Improvement Amendments (CLIA) as qualified to perform high complexity clinical laboratory testing.    GROSS DESCRIPTION: A. Labeled: cbx gastric erosions rule out H. pylori Received: Formalin Collection time: 11:59 AM 11/10/2020 Placed into formalin time: 11:59 AM on 11/10/2020 Tissue fragment(s): 3 Size: Aggregate, 0.6 x 0.5 x 0.2 cm Description: Tan soft tissue fragments Entirely submitted in 1 cassette.  B. Labeled: Descending colon polyp cold snare Received: Formalin Collection time: 12:34 PM on 11/10/2020 Placed into formalin time: 12:34 PM on 11/10/2020 Tissue fragment(s): 2 Size: Range from 0.5-0.8 cm Description: Tan soft tissue fragments Entirely submitted in 1 cassett e.  RB 11/10/2020   Final Diagnosis performed by Betsy Pries, MD.   Electronically signed 11/12/2020 1:41:38PM The electronic signature indicates that the named Attending  Pathologist has evaluated the specimen Technical component performed at [facility], [address] [place], [place] [address] Lab: [phone] Dir: Rush Farmer, MD, MMM  Professional component performed at [facility], [facility], [place], [place], [place] [address] Lab: [phone] Dir: Kathi Simpers, MD       Assessment & Plan:   Problem List Items Addressed This Visit       Other   Anxiety - Primary    Chronic. Ongoing. Will change Effexor to Lexapro 5mg  daily. Side effects and benefits of medication discussed with patient during visit. Will increase at next visit if necessary.  Will  also add Buspar for patient to take PRN for anxiety.  Seroquel renewed to help with sleep.  Follow up in 1 month for reevaluation.      Relevant Medications   escitalopram (LEXAPRO) 5 MG tablet   busPIRone (BUSPAR) 5 MG tablet   Depression, major, recurrent, mild (HCC)    Chronic. Ongoing. Will change Effexor to Lexapro 5mg  daily. Side effects and benefits of medication discussed with patient during visit. Will increase at next visit if necessary.  Will also add Buspar for patient to take PRN for anxiety.  Seroquel renewed to help with sleep.  Follow up in 1 month for reevaluation.      Relevant Medications   escitalopram (LEXAPRO) 5 MG tablet   busPIRone (BUSPAR) 5 MG tablet   Other Visit Diagnoses     Need for shingles vaccine       Relevant Orders   Varicella-zoster vaccine IM (Shingrix) (Completed)        Follow up plan: Return in about 1 month (around 02/19/2021) for Depression/Anxiety FU.

## 2021-01-19 ENCOUNTER — Encounter: Payer: Self-pay | Admitting: Nurse Practitioner

## 2021-01-19 ENCOUNTER — Other Ambulatory Visit: Payer: Self-pay

## 2021-01-19 ENCOUNTER — Ambulatory Visit: Payer: 59 | Admitting: Nurse Practitioner

## 2021-01-19 VITALS — BP 122/81 | HR 83 | Temp 98.5°F | Wt 180.6 lb

## 2021-01-19 DIAGNOSIS — F419 Anxiety disorder, unspecified: Secondary | ICD-10-CM | POA: Diagnosis not present

## 2021-01-19 DIAGNOSIS — Z23 Encounter for immunization: Secondary | ICD-10-CM

## 2021-01-19 DIAGNOSIS — R69 Illness, unspecified: Secondary | ICD-10-CM | POA: Diagnosis not present

## 2021-01-19 DIAGNOSIS — F330 Major depressive disorder, recurrent, mild: Secondary | ICD-10-CM

## 2021-01-19 MED ORDER — ESCITALOPRAM OXALATE 5 MG PO TABS: 5.0000 mg | ORAL_TABLET | Freq: Every day | ORAL | 0 refills | Status: DC

## 2021-01-19 MED ORDER — BUSPIRONE HCL 5 MG PO TABS: 5.0000 mg | ORAL_TABLET | Freq: Two times a day (BID) | ORAL | 0 refills | Status: DC

## 2021-01-19 MED ORDER — QUETIAPINE FUMARATE 25 MG PO TABS: 25.0000 mg | ORAL_TABLET | Freq: Every day | ORAL | 1 refills | Status: DC

## 2021-01-19 NOTE — Assessment & Plan Note (Signed)
Chronic. Ongoing. Will change Effexor to Lexapro 5mg  daily. Side effects and benefits of medication discussed with patient during visit. Will increase at next visit if necessary.  Will also add Buspar for patient to take PRN for anxiety.  Seroquel renewed to help with sleep.  Follow up in 1 month for reevaluation.

## 2021-02-07 ENCOUNTER — Other Ambulatory Visit: Payer: Self-pay | Admitting: Gastroenterology

## 2021-02-07 ENCOUNTER — Other Ambulatory Visit: Payer: Self-pay | Admitting: Nurse Practitioner

## 2021-02-09 NOTE — Telephone Encounter (Signed)
Requested Prescriptions  Pending Prescriptions Disp Refills   escitalopram (LEXAPRO) 5 MG tablet [Pharmacy Med Name: Escitalopram Oxalate 5 MG Oral Tablet] 30 tablet 0    Sig: Take 1 tablet by mouth once daily     Psychiatry:  Antidepressants - SSRI Passed - 02/07/2021 12:32 PM      Passed - Completed PHQ-2 or PHQ-9 in the last 360 days      Passed - Valid encounter within last 6 months    Recent Outpatient Visits          3 weeks ago [place], NP   1 month ago COVID-19   [facility], [place], DO   1 month ago [place], NP   4 months ago Annual physical exam   [facility] Jon Billings, NP   6 months ago Depression with anxiety   [facility] Jon Billings, NP      Future Appointments            In 1 week Jon Billings, [place], [place]   In 2 weeks Marius Ditch, Tally Due, MD [facility]

## 2021-02-16 ENCOUNTER — Ambulatory Visit: Payer: Self-pay | Admitting: Neurology

## 2021-02-19 ENCOUNTER — Other Ambulatory Visit: Payer: Self-pay

## 2021-02-19 ENCOUNTER — Encounter: Payer: Self-pay | Admitting: Nurse Practitioner

## 2021-02-19 ENCOUNTER — Ambulatory Visit (INDEPENDENT_AMBULATORY_CARE_PROVIDER_SITE_OTHER): Payer: 59 | Admitting: Nurse Practitioner

## 2021-02-19 VITALS — BP 128/87 | HR 76 | Temp 98.5°F | Wt 182.6 lb

## 2021-02-19 DIAGNOSIS — R69 Illness, unspecified: Secondary | ICD-10-CM | POA: Diagnosis not present

## 2021-02-19 DIAGNOSIS — F330 Major depressive disorder, recurrent, mild: Secondary | ICD-10-CM

## 2021-02-19 DIAGNOSIS — F419 Anxiety disorder, unspecified: Secondary | ICD-10-CM

## 2021-02-19 DIAGNOSIS — K118 Other diseases of salivary glands: Secondary | ICD-10-CM | POA: Diagnosis not present

## 2021-02-19 DIAGNOSIS — R221 Localized swelling, mass and lump, neck: Secondary | ICD-10-CM | POA: Diagnosis not present

## 2021-02-19 MED ORDER — ESCITALOPRAM OXALATE 10 MG PO TABS: 10.0000 mg | ORAL_TABLET | Freq: Every day | ORAL | 1 refills | Status: DC

## 2021-02-19 NOTE — Assessment & Plan Note (Signed)
Chronic.  Improving.  Will increase Lexapro to 10mg  daily.  Continue with Buspar 5mg  BID and Seroquel at bedtime.  Follow up in 3 months for reevaluation.  Call sooner if concerns arise.

## 2021-02-19 NOTE — Patient Instructions (Signed)
Please call to schedule your mammogram and/or bone density: °[facility]  °Address: [address], [place], [place] [address]  °Phone: [phone] ° °

## 2021-02-19 NOTE — Progress Notes (Signed)
BP 128/87    Pulse 76    Temp 98.5 F (36.9 C) (Oral)    Wt 182 lb 9.6 oz (82.8 kg)    SpO2 96%    BMI 32.35 kg/m    Subjective:    Patient ID: Cynthia Sellers, female    DOB: Jan 01, 1966, 56 y.o.   MRN: [id number]  HPI: Cynthia Sellers is a 56 y.o. female  Chief Complaint  Patient presents with   Depression   Anxiety   ANXIETY/DEPRESSION Patient states that the new medication is working much better for her.  She feels like it could be better. She likes the Lexapro has really helped her.    GAD 7 : Generalized Anxiety Score 02/19/2021 01/19/2021 12/15/2020 09/15/2020  Nervous, Anxious, on Edge 0 2 3 3   Control/stop worrying 3 3 3 3   Worry too much - different things 3 3 3 3   Trouble relaxing 0 1 1 1   Restless 0 0 0 0  Easily annoyed or irritable 1 3 0 0  Afraid - awful might happen 0 1 0 0  Total GAD 7 Score 7 13 10 10   Anxiety Difficulty Somewhat difficult Somewhat difficult Not difficult at all Not difficult at all    [facility] Visit from 02/19/2021 in [place]  PHQ-9 Total Score 6       Patient states she noticed a lump on her neck several months ago.  Does not hurt, no difficulty swallowing, hasn't gotten any larger.    Relevant past medical, surgical, family and social history reviewed and updated as indicated. Interim medical history since our last visit reviewed. Allergies and medications reviewed and updated.  Review of Systems  Psychiatric/Behavioral:  Positive for dysphoric mood. Negative for sleep disturbance and suicidal ideas. The patient is nervous/anxious.    Per HPI unless specifically indicated above     Objective:    BP 128/87    Pulse 76    Temp 98.5 F (36.9 C) (Oral)    Wt 182 lb 9.6 oz (82.8 kg)    SpO2 96%    BMI 32.35 kg/m   Wt Readings from Last 3 Encounters:  02/19/21 182 lb 9.6 oz (82.8 kg)  01/19/21 180 lb 9.6 oz (81.9 kg)  12/15/20 183 lb 3.2 oz (83.1 kg)    Physical Exam Vitals and nursing note  reviewed.  Constitutional:      General: She is not in acute distress.    Appearance: Normal appearance. She is normal weight. She is not ill-appearing, toxic-appearing or diaphoretic.  HENT:     Head: Normocephalic.     Right Ear: External ear normal.     Left Ear: External ear normal.     Nose: Nose normal.     Mouth/Throat:     Mouth: Mucous membranes are moist.     Pharynx: Oropharynx is clear.  Eyes:     General:        Right eye: No discharge.        Left eye: No discharge.     Extraocular Movements: Extraocular movements intact.     Conjunctiva/sclera: Conjunctivae normal.     Pupils: Pupils are equal, round, and reactive to light.  Neck:   Cardiovascular:     Rate and Rhythm: Normal rate and regular rhythm.     Heart sounds: No murmur heard. Pulmonary:     Effort: Pulmonary effort is normal. No respiratory distress.     Breath sounds: Normal breath sounds.  No wheezing or rales.  Musculoskeletal:     Cervical back: Normal range of motion and neck supple.  Skin:    General: Skin is warm and dry.     Capillary Refill: Capillary refill takes less than 2 seconds.  Neurological:     General: No focal deficit present.     Mental Status: She is alert and oriented to person, place, and time. Mental status is at baseline.  Psychiatric:        Mood and Affect: Mood normal.        Behavior: Behavior normal.        Thought Content: Thought content normal.        Judgment: Judgment normal.    Results for orders placed or performed during the hospital encounter of 11/10/20  Surgical pathology  Result Value Ref Range   SURGICAL PATHOLOGY      SURGICAL PATHOLOGY CASE: [phone] PATIENT: Carmine Savoy Surgical Pathology Report     Specimen Submitted: A. Stomach erosions; cbx B. Colon polyp, descending; cold snare  Clinical History: Chronic GERD K21.9.  Colon cancer screening Z12.11. Gastric ulcer, gastric erythema, hiatal hernia, gastric erosions, polyps,  sigmoid diverticulosis      DIAGNOSIS: A. STOMACH EROSIONS; COLD BIOPSY: - CHRONIC AND FOCALLY ACTIVE GASTRITIS. - NEGATIVE FOR HELICOBACTER PYLORI BY IMMUNOHISTOCHEMISTRY. - NEGATIVE FOR INTESTINAL METAPLASIA, DYSPLASIA, AND MALIGNANCY.  B. COLON POLYP, DESCENDING; COLD SNARE: - COLONIC MUCOSA WITH INTRAMUCOSAL LYMPHOID AGGREGATES. - NEGATIVE FOR DYSPLASIA AND MALIGNANCY.  Comment: Immunohistochemical stain for H pylori (block A1) is negative.  IHC slides were prepared by [facility], [place]. All controls stained appropriately.  This test was developed and its performance characteristics determined by [facility]. It has not been c leared or approved by the [place] Food and Drug Administration. The FDA does not require this test to go through premarket FDA review. This test is used for clinical purposes. It should not be regarded as investigational or for research. This laboratory is certified under the Clinical Laboratory Improvement Amendments (CLIA) as qualified to perform high complexity clinical laboratory testing.    GROSS DESCRIPTION: A. Labeled: cbx gastric erosions rule out H. pylori Received: Formalin Collection time: 11:59 AM 11/10/2020 Placed into formalin time: 11:59 AM on 11/10/2020 Tissue fragment(s): 3 Size: Aggregate, 0.6 x 0.5 x 0.2 cm Description: Tan soft tissue fragments Entirely submitted in 1 cassette.  B. Labeled: Descending colon polyp cold snare Received: Formalin Collection time: 12:34 PM on 11/10/2020 Placed into formalin time: 12:34 PM on 11/10/2020 Tissue fragment(s): 2 Size: Range from 0.5-0.8 cm Description: Tan soft tissue fragments Entirely submitted in 1 cassett e.  RB 11/10/2020   Final Diagnosis performed by Betsy Pries, MD.   Electronically signed 11/12/2020 1:41:38PM The electronic signature indicates that the named Attending Pathologist has evaluated the specimen Technical component performed at [facility], [address],  [place], [place] [address] Lab: [phone] Dir: Rush Farmer, MD, MMM  Professional component performed at [facility], [facility], [place], [place], [place] [address] Lab: [phone] Dir: Kathi Simpers, MD       Assessment & Plan:   Problem List Items Addressed This Visit       Other   Anxiety    Chronic.  Improving.  Will increase Lexapro to 10mg  daily.  Continue with Buspar 5mg  BID and Seroquel at bedtime.  Follow up in 3 months for reevaluation.  Call sooner if concerns arise.       Relevant Medications   escitalopram (LEXAPRO) 10 MG  tablet   Depression, major, recurrent, mild (HCC)    Chronic.  Improving.  Will increase Lexapro to 10mg  daily.  Continue with Buspar 5mg  BID and Seroquel at bedtime.  Follow up in 3 months for reevaluation.  Call sooner if concerns arise.        Relevant Medications   escitalopram (LEXAPRO) 10 MG tablet   Other Visit Diagnoses     Lump on neck    -  Primary   Will obtain US to evaluate further. Will make recommendations based on imaging results.   Relevant Orders   US Soft Tissue Head/Neck (NON-THYROID)        Follow up plan: Return in about 3 months (around 05/19/2021) for Depression/Anxiety FU.

## 2021-02-25 ENCOUNTER — Ambulatory Visit
Admission: RE | Admit: 2021-02-25 | Discharge: 2021-02-25 | Disposition: A | Discharge status: Home / Self Care | Payer: 59 | Source: Ambulatory Visit | Attending: Nurse Practitioner | Admitting: Nurse Practitioner

## 2021-02-25 ENCOUNTER — Other Ambulatory Visit: Payer: Self-pay

## 2021-02-25 DIAGNOSIS — E041 Nontoxic single thyroid nodule: Secondary | ICD-10-CM | POA: Diagnosis not present

## 2021-02-25 DIAGNOSIS — R221 Localized swelling, mass and lump, neck: Secondary | ICD-10-CM | POA: Diagnosis not present

## 2021-02-26 ENCOUNTER — Ambulatory Visit: Payer: 59 | Admitting: Gastroenterology

## 2021-02-26 NOTE — Progress Notes (Signed)
Please let patient know that the Ultrasound showed that she has a mass of the submandibular gland which is a salivary gland.  The ultrasound didn't get enough information.  It is recommended that she have a CT scan which I have ordered and that she see an ENT.  I have placed the referral for this.

## 2021-02-26 NOTE — Addendum Note (Signed)
Addended by: Jon Billings on: 02/26/2021 01:24 PM   Modules accepted: Orders

## 2021-03-10 ENCOUNTER — Other Ambulatory Visit: Payer: Self-pay | Admitting: Nurse Practitioner

## 2021-03-11 NOTE — Telephone Encounter (Signed)
Requested Prescriptions  ?Pending Prescriptions Disp Refills  ?? busPIRone (BUSPAR) 5 MG tablet [Pharmacy Med Name: busPIRone HCl 5 MG Oral Tablet] 90 tablet 0  ?  Sig: Take 1 tablet by mouth twice daily  ?  ? Psychiatry: Anxiolytics/Hypnotics - Non-controlled Passed - 03/10/2021 10:46 AM  ?  ?  Passed - Valid encounter within last 12 months  ?  Recent Outpatient Visits   ?      ? 2 weeks ago Lump on neck  ? [place], NP  ? 1 month ago Anxiety  ? [place], NP  ? 2 months ago COVID-19  ? [place], DO  ? 2 months ago Anxiety  ? [place], NP  ? 5 months ago Annual physical exam  ? [facility] Jon Billings, NP  ?  ?  ?Future Appointments   ?        ? In 2 months Jon Billings, NP [facility]  ?  ? ?  ?  ?  ? ?

## 2021-04-02 DIAGNOSIS — R221 Localized swelling, mass and lump, neck: Secondary | ICD-10-CM | POA: Diagnosis not present

## 2021-04-09 ENCOUNTER — Other Ambulatory Visit: Payer: Self-pay | Admitting: Otolaryngology

## 2021-04-09 DIAGNOSIS — R220 Localized swelling, mass and lump, head: Secondary | ICD-10-CM

## 2021-04-14 ENCOUNTER — Ambulatory Visit
Admission: RE | Admit: 2021-04-14 | Discharge: 2021-04-14 | Disposition: A | Discharge status: Home / Self Care | Payer: 59 | Source: Ambulatory Visit | Attending: Otolaryngology | Admitting: Otolaryngology

## 2021-04-14 DIAGNOSIS — R220 Localized swelling, mass and lump, head: Secondary | ICD-10-CM

## 2021-04-14 DIAGNOSIS — M47812 Spondylosis without myelopathy or radiculopathy, cervical region: Secondary | ICD-10-CM | POA: Diagnosis not present

## 2021-04-14 DIAGNOSIS — E041 Nontoxic single thyroid nodule: Secondary | ICD-10-CM | POA: Diagnosis not present

## 2021-04-14 MED ORDER — IOPAMIDOL (ISOVUE-300) INJECTION 61%
75.0000 mL | Freq: Once | INTRAVENOUS | Status: AC | PRN
  Administered 2021-04-14: 75 mL via INTRAVENOUS

## 2021-04-16 ENCOUNTER — Other Ambulatory Visit: Payer: Self-pay | Admitting: Otolaryngology

## 2021-04-20 ENCOUNTER — Other Ambulatory Visit: Payer: Self-pay | Admitting: Otolaryngology

## 2021-04-20 DIAGNOSIS — R220 Localized swelling, mass and lump, head: Secondary | ICD-10-CM

## 2021-04-23 ENCOUNTER — Ambulatory Visit
Admission: RE | Admit: 2021-04-23 | Discharge: 2021-04-23 | Disposition: A | Discharge status: Home / Self Care | Payer: 59 | Source: Ambulatory Visit | Attending: Otolaryngology | Admitting: Otolaryngology

## 2021-04-23 DIAGNOSIS — R220 Localized swelling, mass and lump, head: Secondary | ICD-10-CM | POA: Diagnosis not present

## 2021-04-23 DIAGNOSIS — K118 Other diseases of salivary glands: Secondary | ICD-10-CM | POA: Diagnosis not present

## 2021-04-23 NOTE — Procedures (Signed)
Vascular and Interventional Radiology Procedure Note ? ?Patient: Cynthia Sellers ?DOB: Jun 28, 1965 ?Medical Record Number: [id number] ?Note Date/Time: 04/23/21 2:51 PM  ? ?Performing Physician: Michaelle Birks, MD ?Assistant(s): None ? ?Diagnosis: palpable abnormality at R neck ? ?Procedure: RIGHT SUBMANDIBULAR MASS BIOPSY ? ?Anesthesia: Local Anesthetic ?Complications: None ?Estimated Blood Loss:  0 mL ?Specimens: Sent for Pathology ? ?Findings:  ?Successful Ultrasound-guided biopsy of R submandibular mass. ?A total of 3 samples were obtained. ?Hemostasis of the tract was achieved using Manual Pressure. ? ?Plan: Bed rest for 1 hours. ? ?See detailed procedure note with images in PACS. ?The patient tolerated the procedure well without incident or complication and was returned to Recovery in stable condition.  ? ? ?Michaelle Birks, MD ?[facility] ?[facility] ? ? ?Pager. [phone] ?Clinic. [phone]  ?

## 2021-04-24 LAB — SURGICAL PATHOLOGY

## 2021-05-18 NOTE — Progress Notes (Signed)
? ?BP (!) 133/99   Pulse 71   Temp 98.6 ?F (37 ?C) (Oral)   Wt 181 lb 6.4 oz (82.3 kg)   SpO2 95%   BMI 32.13 kg/m?   ? ?Subjective:  ? ? Patient ID: Cynthia Sellers, female    DOB: [birth year], 56 y.o.   MRN: [id number] ? ?HPI: ?Cynthia Sellers is a 56 y.o. female ? ?Chief Complaint  ?Patient presents with  ? Depression  ?  3 month follow up   ? Anxiety  ? ?ANXIETY/DEPRESSION ?Patient states her medications are working well. She does need a refill in the Buspar.  It works well for her.  Denies SI.  Denies concerns regarding depression/anxiety at visit today.  ? ? ?  05/19/2021  ? 10:25 AM 02/19/2021  ? 10:57 AM 01/19/2021  ? 11:22 AM 12/15/2020  ? 11:05 AM  ?GAD 7 : Generalized Anxiety Score  ?Nervous, Anxious, on Edge 0 0 2 3  ?Control/stop worrying '3 3 3 3  '$ ?Worry too much - different things '3 3 3 3  '$ ?Trouble relaxing 1 0 1 1  ?Restless 0 0 0 0  ?Easily annoyed or irritable 0 1 3 0  ?Afraid - awful might happen 0 0 1 0  ?Total GAD 7 Score '7 7 13 10  '$ ?Anxiety Difficulty Not difficult at all Somewhat difficult Somewhat difficult Not difficult at all  ? ? ?[place] Office Visit from 05/19/2021 in [place]  ?PHQ-9 Total Score 5  ? ?  ? ? ?HYPERTENSION ?Hypertension status: uncontrolled  ?Satisfied with current treatment? no ?Duration of hypertension: years ?BP monitoring frequency:  not checking ?BP range:  ?BP medication side effects:  no ?Medication compliance: excellent compliance ?Previous BP meds:valsartan ?Aspirin: no ?Recurrent headaches: yes ?Visual changes: no ?Palpitations: no ?Dyspnea: no ?Chest pain: no ?Lower extremity edema: no ?Dizzy/lightheaded: no ? ? ? ?Relevant past medical, surgical, family and social history reviewed and updated as indicated. Interim medical history since our last visit reviewed. ?Allergies and medications reviewed and updated. ? ?Review of Systems  ?Eyes:  Negative for visual disturbance.  ?Respiratory:  Negative for cough, chest tightness and  shortness of breath.   ?Cardiovascular:  Negative for chest pain, palpitations and leg swelling.  ?Neurological:  Positive for tremors and headaches. Negative for dizziness.  ?Psychiatric/Behavioral:  Positive for dysphoric mood. Negative for sleep disturbance and suicidal ideas. The patient is nervous/anxious.   ? ?Per HPI unless specifically indicated above ? ?   ?Objective:  ?  ?BP (!) 133/99   Pulse 71   Temp 98.6 ?F (37 ?C) (Oral)   Wt 181 lb 6.4 oz (82.3 kg)   SpO2 95%   BMI 32.13 kg/m?   ?Wt Readings from Last 3 Encounters:  ?05/19/21 181 lb 6.4 oz (82.3 kg)  ?02/19/21 182 lb 9.6 oz (82.8 kg)  ?01/19/21 180 lb 9.6 oz (81.9 kg)  ?  ?Physical Exam ?Vitals and nursing note reviewed.  ?Constitutional:   ?   General: She is not in acute distress. ?   Appearance: Normal appearance. She is normal weight. She is not ill-appearing, toxic-appearing or diaphoretic.  ?HENT:  ?   Head: Normocephalic.  ?   Right Ear: External ear normal.  ?   Left Ear: External ear normal.  ?   Nose: Nose normal.  ?   Mouth/Throat:  ?   Mouth: Mucous membranes are moist.  ?   Pharynx: Oropharynx is clear.  ?Eyes:  ?   General:     ?  Right eye: No discharge.     ?   Left eye: No discharge.  ?   Extraocular Movements: Extraocular movements intact.  ?   Conjunctiva/sclera: Conjunctivae normal.  ?   Pupils: Pupils are equal, round, and reactive to light.  ?Cardiovascular:  ?   Rate and Rhythm: Normal rate and regular rhythm.  ?   Heart sounds: No murmur heard. ?Pulmonary:  ?   Effort: Pulmonary effort is normal. No respiratory distress.  ?   Breath sounds: Normal breath sounds. No wheezing or rales.  ?Musculoskeletal:  ?   Cervical back: Normal range of motion and neck supple.  ?Skin: ?   General: Skin is warm and dry.  ?   Capillary Refill: Capillary refill takes less than 2 seconds.  ?Neurological:  ?   General: No focal deficit present.  ?   Mental Status: She is alert and oriented to person, place, and time. Mental status is at  baseline.  ?Psychiatric:     ?   Mood and Affect: Mood normal.     ?   Behavior: Behavior normal.     ?   Thought Content: Thought content normal.     ?   Judgment: Judgment normal.  ? ? ?Results for orders placed or performed during the hospital encounter of 04/23/21  ?Surgical pathology  ?Result Value Ref Range  ? SURGICAL PATHOLOGY    ?  SURGICAL PATHOLOGY ?CASE: [id number] ?PATIENT: Cynthia Sellers ?Surgical Pathology Report ? ? ? ? ?Specimen Submitted: ?A. Submandibular mass, right ? ?Clinical History: 56 year old female with 2.3 cm right submandibular ?gland mass. ? ? ? ? ? ?DIAGNOSIS: ?A.  SUBMANDIBULAR MASS, RIGHT; ULTRASOUND-GUIDED BIOPSY: ?- PLEOMORPHIC ADENOMA WITH ATYPIA. ? ?Comment: ?Atypical features are noted including areas of hypercellularity, nuclear ?variability, and an increase in mitotic figures. High grade features and ?malignant transformation are not identified in this specimen. ? ?GROSS DESCRIPTION: ?A. Labeled: Right submandibular ?Received: Fresh ?Collection time: 2:34 PM on 04/23/2021 ?Placed into formalin time: 3:05 PM on 04/23/2021 ?Number of needle core biopsy(s): 3 plus additional fragments ?Length: Ranges from 0.7 to 1.0 cm ?Diameter: 0.1 cm ?Description: Received are tan cores of soft tissue. A touch prep with ?Diff-Quik stain is performed. ?Ink: Black ?Entirely submitted in 3 cassettes  with 1 core in A1, 1 core in A2, and 1 ?core plus additional fragments in A3 ? ?CM 04/23/2021 ? ?Final Diagnosis performed by Quay Burow, MD.   Electronically signed ?04/24/2021 11:43:57AM ?The electronic signature indicates that the named Attending Pathologist ?has evaluated the specimen ?Technical component performed at [facility], [address], [place], ?[place] [address] Lab: [phone] Dir: Rush Farmer, MD, MMM ? Professional component performed at [facility], [facility], [place], [place], [place] [address] Lab: [phone] ?Dir: Kathi Simpers, MD ?  ? ?    ?Assessment & Plan:  ? ?Problem List Items Addressed This Visit   ? ?  ? Cardiovascular and Mediastinum  ? Primary hypertension  ?  Chronic. Not well controlled. Will start Valsartn '40mg'$  daily.  Side effects and benefits of medication discussed during visit.  Follow up in 1 month.  Call sooner if concerns arise. ? ?  ?  ? Relevant Medications  ? valsartan (DIOVAN) 40 MG tablet  ?  ? Nervous and Auditory  ? Familial tremor  ?  New referral placed for patient to see Neurology for evaluation of tremor. ? ?  ?  ? Relevant Orders  ? Ambulatory referral to Neurology  ?  ?  Other  ? Anxiety  ?  Chronic.  Controlled.  Continue with current medication regimen of lexapro and Buspar.  Return to clinic in 3 months for reevaluation.  Call sooner if concerns arise.  ? ? ?  ?  ? Relevant Medications  ? busPIRone (BUSPAR) 5 MG tablet  ? escitalopram (LEXAPRO) 10 MG tablet  ? Depression, major, recurrent, mild ([place]) - Primary  ?  Chronic.  Controlled.  Continue with current medication regimen of lexapro and Buspar.  Return to clinic in 3 months for reevaluation.  Call sooner if concerns arise.  ? ?  ?  ? Relevant Medications  ? busPIRone (BUSPAR) 5 MG tablet  ? escitalopram (LEXAPRO) 10 MG tablet  ?  ? ?Follow up plan: ?Return in about 1 month (around 06/19/2021) for BP Check. ? ? ? ? ? ? ?

## 2021-05-19 ENCOUNTER — Telehealth: Payer: Self-pay

## 2021-05-19 ENCOUNTER — Encounter: Payer: Self-pay | Admitting: Nurse Practitioner

## 2021-05-19 ENCOUNTER — Ambulatory Visit (INDEPENDENT_AMBULATORY_CARE_PROVIDER_SITE_OTHER): Payer: PPO | Admitting: Nurse Practitioner

## 2021-05-19 VITALS — BP 133/99 | HR 71 | Temp 98.6°F | Wt 181.4 lb

## 2021-05-19 DIAGNOSIS — G250 Essential tremor: Secondary | ICD-10-CM

## 2021-05-19 DIAGNOSIS — F330 Major depressive disorder, recurrent, mild: Secondary | ICD-10-CM | POA: Diagnosis not present

## 2021-05-19 DIAGNOSIS — F419 Anxiety disorder, unspecified: Secondary | ICD-10-CM | POA: Diagnosis not present

## 2021-05-19 DIAGNOSIS — I10 Essential (primary) hypertension: Secondary | ICD-10-CM

## 2021-05-19 HISTORY — DX: Essential (primary) hypertension: I10

## 2021-05-19 MED ORDER — QUETIAPINE FUMARATE 25 MG PO TABS
25.0000 mg | ORAL_TABLET | Freq: Every day | ORAL | 1 refills | Status: DC
Start: 2021-05-19 — End: 2022-02-09

## 2021-05-19 MED ORDER — BUSPIRONE HCL 5 MG PO TABS: 5.0000 mg | ORAL_TABLET | Freq: Two times a day (BID) | ORAL | 1 refills | Status: DC

## 2021-05-19 MED ORDER — ESCITALOPRAM OXALATE 10 MG PO TABS: 10.0000 mg | ORAL_TABLET | Freq: Every day | ORAL | 1 refills | Status: DC

## 2021-05-19 MED ORDER — VALSARTAN 40 MG PO TABS: 40.0000 mg | ORAL_TABLET | Freq: Every day | ORAL | 0 refills | Status: DC

## 2021-05-19 NOTE — Assessment & Plan Note (Signed)
Chronic.  Controlled.  Continue with current medication regimen of lexapro and Buspar.  Return to clinic in 3 months for reevaluation.  Call sooner if concerns arise.  ? ?

## 2021-05-19 NOTE — Assessment & Plan Note (Signed)
Chronic.  Controlled.  Continue with current medication regimen of lexapro and Buspar.  Return to clinic in 3 months for reevaluation.  Call sooner if concerns arise.  ?

## 2021-05-19 NOTE — Assessment & Plan Note (Signed)
New referral placed for patient to see Neurology for evaluation of tremor. ?

## 2021-05-19 NOTE — Telephone Encounter (Signed)
Opened in error

## 2021-05-19 NOTE — Assessment & Plan Note (Signed)
Chronic. Not well controlled. Will start Valsartn '40mg'$  daily.  Side effects and benefits of medication discussed during visit.  Follow up in 1 month.  Call sooner if concerns arise. ?

## 2021-05-27 DIAGNOSIS — D37032 Neoplasm of uncertain behavior of the submandibular salivary glands: Secondary | ICD-10-CM | POA: Diagnosis not present

## 2021-06-04 ENCOUNTER — Encounter
Admission: RE | Admit: 2021-06-04 | Discharge: 2021-06-04 | Disposition: A | Discharge status: Home / Self Care | Payer: 59 | Source: Ambulatory Visit | Attending: Otolaryngology | Admitting: Otolaryngology

## 2021-06-04 DIAGNOSIS — I10 Essential (primary) hypertension: Secondary | ICD-10-CM

## 2021-06-04 DIAGNOSIS — Z01818 Encounter for other preprocedural examination: Secondary | ICD-10-CM

## 2021-06-04 DIAGNOSIS — E669 Obesity, unspecified: Secondary | ICD-10-CM

## 2021-06-04 HISTORY — DX: Essential (primary) hypertension: I10

## 2021-06-04 NOTE — Patient Instructions (Signed)
Your procedure is scheduled on:06-10-21 Wednesday Report to the Registration Desk on the 1st floor of the [facility].Then proceed to the 2nd floor Surgery Desk To find out your arrival time, please call [phone] between 1PM - 3PM on:06-09-21 Tuesday If your arrival time is 6:00 am, do not arrive prior to that time as the [facility] entrance doors do not open until 6:00 am.  REMEMBER: Instructions that are not followed completely may result in serious medical risk, up to and including death; or upon the discretion of your surgeon and anesthesiologist your surgery may need to be rescheduled.  Do not eat food after midnight the night before surgery.  No gum chewing, lozengers or hard candies.  You may however, drink CLEAR liquids up to 2 hours before you are scheduled to arrive for your surgery. Do not drink anything within 2 hours of your scheduled arrival time.  Clear liquids include: - water  - apple juice without pulp - gatorade (not RED colors) - black coffee or tea (Do NOT add milk or creamers to the coffee or tea) Do NOT drink anything that is not on this list.  TAKE THESE MEDICATIONS THE MORNING OF SURGERY WITH A SIP OF WATER: -busPIRone (BUSPAR)  -escitalopram (LEXAPRO)  -omeprazole (PRILOSEC)  One week prior to surgery: Stop Anti-inflammatories (NSAIDS) such as Advil, Aleve, Ibuprofen, Motrin, Naproxen, Naprosyn and Aspirin based products such as Excedrin, Goodys Powder, BC Powder.You may however, take Tylenol if needed for pain up until the day of surgery.  Stop ANY OVER THE COUNTER supplements/vitamins NOW (06-04-21) until after surgery.  No Alcohol for 24 hours before or after surgery.  No Smoking including e-cigarettes for 24 hours prior to surgery.  No chewable tobacco products for at least 6 hours prior to surgery.  No nicotine patches on the day of surgery.  Do not use any "recreational" drugs for at least a week prior to your surgery.  Please be advised that  the combination of cocaine and anesthesia may have negative outcomes, up to and including death. If you test positive for cocaine, your surgery will be cancelled.  On the morning of surgery brush your teeth with toothpaste and water, you may rinse your mouth with mouthwash if you wish. Do not swallow any toothpaste or mouthwash.  Use CHG Soap as directed on instruction sheet.  Do not wear jewelry, make-up, hairpins, clips or nail polish.  Do not wear lotions, powders, or perfumes.   Do not shave body from the neck down 48 hours prior to surgery just in case you cut yourself which could leave a site for infection.  Also, freshly shaved skin may become irritated if using the CHG soap.  Contact lenses, hearing aids and dentures may not be worn into surgery.  Do not bring valuables to the hospital. [facility] is not responsible for any missing/lost belongings or valuables.   Notify your doctor if there is any change in your medical condition (cold, fever, infection).  Wear comfortable clothing (specific to your surgery type) to the hospital.  After surgery, you can help prevent lung complications by doing breathing exercises.  Take deep breaths and cough every 1-2 hours. Your doctor may order a device called an Incentive Spirometer to help you take deep breaths. When coughing or sneezing, hold a pillow firmly against your incision with both hands. This is called "splinting." Doing this helps protect your incision. It also decreases belly discomfort.  If you are being admitted to the hospital overnight,  leave your suitcase in the car. After surgery it may be brought to your room.  If you are being discharged the day of surgery, you will not be allowed to drive home. You will need a responsible adult (18 years or older) to drive you home and stay with you that night.   If you are taking public transportation, you will need to have a responsible adult (18 years or older) with you. Please  confirm with your physician that it is acceptable to use public transportation.   Please call the [facility] Dept. at [phone] if you have any questions about these instructions.  Surgery Visitation Policy:  Patients undergoing a surgery or procedure may have two family members or support persons with them as long as the person is not COVID-19 positive or experiencing its symptoms.

## 2021-06-05 ENCOUNTER — Encounter
Admission: RE | Admit: 2021-06-05 | Discharge: 2021-06-05 | Disposition: A | Discharge status: Home / Self Care | Payer: 59 | Source: Ambulatory Visit | Attending: Otolaryngology | Admitting: Otolaryngology

## 2021-06-05 DIAGNOSIS — Z01810 Encounter for preprocedural cardiovascular examination: Secondary | ICD-10-CM | POA: Diagnosis not present

## 2021-06-05 DIAGNOSIS — E669 Obesity, unspecified: Secondary | ICD-10-CM | POA: Diagnosis not present

## 2021-06-05 DIAGNOSIS — Z6830 Body mass index (BMI) 30.0-30.9, adult: Secondary | ICD-10-CM | POA: Diagnosis not present

## 2021-06-05 DIAGNOSIS — I10 Essential (primary) hypertension: Secondary | ICD-10-CM | POA: Diagnosis not present

## 2021-06-05 DIAGNOSIS — Z01818 Encounter for other preprocedural examination: Secondary | ICD-10-CM | POA: Diagnosis not present

## 2021-06-05 LAB — BASIC METABOLIC PANEL
Anion gap: 6 (ref 5–15)
BUN: 15 mg/dL (ref 6–20)
CO2: 28 mmol/L (ref 22–32)
Calcium: 9.1 mg/dL (ref 8.9–10.3)
Chloride: 107 mmol/L (ref 98–111)
Creatinine, Ser: 0.65 mg/dL (ref 0.44–1.00)
GFR, Estimated: >60 mL/min (ref >60)
Glucose, Bld: 91 mg/dL (ref 70–99)
Potassium: 4.3 mmol/L (ref 3.5–5.1)
Sodium: 141 mmol/L (ref 135–145)

## 2021-06-05 LAB — CBC
HCT: 41.4 % (ref 36.0–46.0)
Hemoglobin: 13.6 g/dL (ref 12.0–15.0)
MCH: 30.2 pg (ref 26.0–34.0)
MCHC: 32.9 g/dL (ref 30.0–36.0)
MCV: 92 fL (ref 80.0–100.0)
Platelets: 246 10*3/uL (ref 150–400)
RBC: 4.5 MIL/uL (ref 3.87–5.11)
RDW: 12.3 % (ref 11.5–15.5)
WBC: 5.2 10*3/uL (ref 4.0–10.5)
nRBC: 0 % (ref 0.0–0.2)

## 2021-06-08 ENCOUNTER — Other Ambulatory Visit: Payer: Self-pay | Admitting: Nurse Practitioner

## 2021-06-09 MED ORDER — ORAL CARE MOUTH RINSE: 15.0000 mL | Freq: Once | OROMUCOSAL | Status: AC

## 2021-06-09 MED ORDER — CHLORHEXIDINE GLUCONATE 0.12 % MT SOLN: 15.0000 mL | Freq: Once | OROMUCOSAL | Status: AC

## 2021-06-09 MED ORDER — LACTATED RINGERS IV SOLN: INTRAVENOUS | Status: DC

## 2021-06-09 NOTE — Telephone Encounter (Signed)
Requested Prescriptions  Pending Prescriptions Disp Refills  . valsartan (DIOVAN) 40 MG tablet [Pharmacy Med Name: Valsartan 40 MG Oral Tablet] 30 tablet 0    Sig: Take 1 tablet by mouth once daily     Cardiovascular:  Angiotensin Receptor Blockers Failed - 06/08/2021 12:18 PM      Failed - Last BP in normal range    BP Readings from Last 1 Encounters:  05/19/21 (!) 133/99         Passed - Cr in normal range and within 180 days    Creatinine  Date Value Ref Range Status  01/31/2012 0.67 0.60 - 1.30 mg/dL Final   Creatinine, Ser  Date Value Ref Range Status  06/05/2021 0.65 0.44 - 1.00 mg/dL Final         Passed - K in normal range and within 180 days    Potassium  Date Value Ref Range Status  06/05/2021 4.3 3.5 - 5.1 mmol/L Final  01/31/2012 3.6 3.5 - 5.1 mmol/L Final         Passed - Patient is not pregnant      Passed - Valid encounter within last 6 months    Recent Outpatient Visits          3 weeks ago Depression, major, recurrent, mild ([place])   [place], Karen, NP   3 months ago Lump on neck   [facility] Jon Billings, NP   4 months ago [place], NP   5 months ago COVID-82   [facility], [place], DO   5 months ago [place], NP      Future Appointments            In 1 week Jon Billings, NP [facility], [place]

## 2021-06-10 ENCOUNTER — Ambulatory Visit: Payer: PPO | Admitting: Anesthesiology

## 2021-06-10 ENCOUNTER — Encounter: Payer: Self-pay | Admitting: Otolaryngology

## 2021-06-10 ENCOUNTER — Ambulatory Visit
Admission: RE | Admit: 2021-06-10 | Discharge: 2021-06-10 | Disposition: A | Discharge status: Home / Self Care | Payer: PPO | Attending: Otolaryngology | Admitting: Otolaryngology

## 2021-06-10 ENCOUNTER — Other Ambulatory Visit: Payer: Self-pay

## 2021-06-10 ENCOUNTER — Encounter
Admission: RE | Disposition: A | Discharge status: Home / Self Care | Payer: Self-pay | Source: Home / Self Care | Attending: Otolaryngology

## 2021-06-10 DIAGNOSIS — K219 Gastro-esophageal reflux disease without esophagitis: Secondary | ICD-10-CM | POA: Insufficient documentation

## 2021-06-10 DIAGNOSIS — D119 Benign neoplasm of major salivary gland, unspecified: Secondary | ICD-10-CM | POA: Diagnosis not present

## 2021-06-10 DIAGNOSIS — F419 Anxiety disorder, unspecified: Secondary | ICD-10-CM | POA: Insufficient documentation

## 2021-06-10 DIAGNOSIS — D117 Benign neoplasm of other major salivary glands: Secondary | ICD-10-CM | POA: Diagnosis not present

## 2021-06-10 DIAGNOSIS — K118 Other diseases of salivary glands: Secondary | ICD-10-CM | POA: Diagnosis present

## 2021-06-10 DIAGNOSIS — F32A Depression, unspecified: Secondary | ICD-10-CM | POA: Diagnosis not present

## 2021-06-10 DIAGNOSIS — Z6831 Body mass index (BMI) 31.0-31.9, adult: Secondary | ICD-10-CM | POA: Diagnosis not present

## 2021-06-10 DIAGNOSIS — R519 Headache, unspecified: Secondary | ICD-10-CM | POA: Diagnosis not present

## 2021-06-10 DIAGNOSIS — I10 Essential (primary) hypertension: Secondary | ICD-10-CM | POA: Diagnosis not present

## 2021-06-10 DIAGNOSIS — E669 Obesity, unspecified: Secondary | ICD-10-CM | POA: Insufficient documentation

## 2021-06-10 DIAGNOSIS — R221 Localized swelling, mass and lump, neck: Secondary | ICD-10-CM | POA: Diagnosis not present

## 2021-06-10 HISTORY — PX: SUBMANDIBULAR GLAND EXCISION: SHX2456

## 2021-06-10 SURGERY — EXCISION, SUBMANDIBULAR GLAND
Anesthesia: General | Site: Neck | Laterality: Right

## 2021-06-10 MED ORDER — SCOPOLAMINE 1 MG/3DAYS TD PT72
MEDICATED_PATCH | TRANSDERMAL | Status: AC
  Filled 2021-06-10: qty 1

## 2021-06-10 MED ORDER — PROPOFOL 10 MG/ML IV BOLUS
INTRAVENOUS | Status: DC | PRN
  Administered 2021-06-10: 30 mg via INTRAVENOUS
  Administered 2021-06-10: 120 mg via INTRAVENOUS

## 2021-06-10 MED ORDER — DEXAMETHASONE SODIUM PHOSPHATE 10 MG/ML IJ SOLN
INTRAMUSCULAR | Status: AC
  Filled 2021-06-10: qty 1

## 2021-06-10 MED ORDER — OXYCODONE HCL 5 MG/5ML PO SOLN: 5.0000 mg | Freq: Once | ORAL | Status: AC | PRN

## 2021-06-10 MED ORDER — REMIFENTANIL HCL 1 MG IV SOLR
INTRAVENOUS | Status: AC
  Filled 2021-06-10: qty 1000

## 2021-06-10 MED ORDER — HEMOSTATIC AGENTS (NO CHARGE) OPTIME
TOPICAL | Status: DC | PRN
  Administered 2021-06-10: 1 via TOPICAL

## 2021-06-10 MED ORDER — LIDOCAINE HCL (PF) 2 % IJ SOLN
INTRAMUSCULAR | Status: AC
  Filled 2021-06-10: qty 5

## 2021-06-10 MED ORDER — PHENYLEPHRINE 80 MCG/ML (10ML) SYRINGE FOR IV PUSH (FOR BLOOD PRESSURE SUPPORT)
PREFILLED_SYRINGE | INTRAVENOUS | Status: AC
  Filled 2021-06-10: qty 10

## 2021-06-10 MED ORDER — BACITRACIN ZINC 500 UNIT/GM EX OINT
TOPICAL_OINTMENT | CUTANEOUS | Status: AC
  Filled 2021-06-10: qty 28.35

## 2021-06-10 MED ORDER — MIDAZOLAM HCL 2 MG/2ML IJ SOLN
INTRAMUSCULAR | Status: DC | PRN
  Administered 2021-06-10: 2 mg via INTRAVENOUS

## 2021-06-10 MED ORDER — OXYCODONE HCL 5 MG PO TABS
5.0000 mg | ORAL_TABLET | Freq: Once | ORAL | Status: AC | PRN
  Administered 2021-06-10: 5 mg via ORAL

## 2021-06-10 MED ORDER — SUCCINYLCHOLINE CHLORIDE 200 MG/10ML IV SOSY
PREFILLED_SYRINGE | INTRAVENOUS | Status: AC
  Filled 2021-06-10: qty 10

## 2021-06-10 MED ORDER — EPHEDRINE 5 MG/ML INJ
INTRAVENOUS | Status: AC
  Filled 2021-06-10: qty 5

## 2021-06-10 MED ORDER — LIDOCAINE-EPINEPHRINE 1 %-1:100000 IJ SOLN
INTRAMUSCULAR | Status: AC
  Filled 2021-06-10: qty 1

## 2021-06-10 MED ORDER — FENTANYL CITRATE (PF) 100 MCG/2ML IJ SOLN
INTRAMUSCULAR | Status: DC | PRN
  Administered 2021-06-10 (×2): 25 ug via INTRAVENOUS
  Administered 2021-06-10: 50 ug via INTRAVENOUS

## 2021-06-10 MED ORDER — BACITRACIN 500 UNIT/GM EX OINT
TOPICAL_OINTMENT | CUTANEOUS | Status: DC | PRN
  Administered 2021-06-10: 1 via TOPICAL

## 2021-06-10 MED ORDER — PROPOFOL 10 MG/ML IV BOLUS
INTRAVENOUS | Status: AC
  Filled 2021-06-10: qty 20

## 2021-06-10 MED ORDER — MIDAZOLAM HCL 2 MG/2ML IJ SOLN
INTRAMUSCULAR | Status: AC
  Filled 2021-06-10: qty 2

## 2021-06-10 MED ORDER — EPHEDRINE SULFATE (PRESSORS) 50 MG/ML IJ SOLN
INTRAMUSCULAR | Status: DC | PRN
  Administered 2021-06-10: 10 mg via INTRAVENOUS
  Administered 2021-06-10: 15 mg via INTRAVENOUS
  Administered 2021-06-10 (×2): 10 mg via INTRAVENOUS

## 2021-06-10 MED ORDER — SUCCINYLCHOLINE CHLORIDE 200 MG/10ML IV SOSY
PREFILLED_SYRINGE | INTRAVENOUS | Status: DC | PRN
  Administered 2021-06-10: 80 mg via INTRAVENOUS

## 2021-06-10 MED ORDER — 0.9 % SODIUM CHLORIDE (POUR BTL) OPTIME
TOPICAL | Status: DC | PRN
  Administered 2021-06-10: 500 mL

## 2021-06-10 MED ORDER — HYDROCODONE-ACETAMINOPHEN 5-325 MG PO TABS: 1.0000 | ORAL_TABLET | Freq: Four times a day (QID) | ORAL | 0 refills | Status: DC | PRN

## 2021-06-10 MED ORDER — SODIUM CHLORIDE 0.9 % IV SOLN: INTRAVENOUS | Status: DC | PRN

## 2021-06-10 MED ORDER — SCOPOLAMINE 1 MG/3DAYS TD PT72
1.0000 | MEDICATED_PATCH | TRANSDERMAL | Status: DC
  Administered 2021-06-10: 1.5 mg via TRANSDERMAL

## 2021-06-10 MED ORDER — LIDOCAINE HCL (CARDIAC) PF 100 MG/5ML IV SOSY
PREFILLED_SYRINGE | INTRAVENOUS | Status: DC | PRN
  Administered 2021-06-10: 60 mg via INTRAVENOUS

## 2021-06-10 MED ORDER — FENTANYL CITRATE (PF) 100 MCG/2ML IJ SOLN
INTRAMUSCULAR | Status: AC
  Administered 2021-06-10: 50 ug via INTRAVENOUS
  Filled 2021-06-10: qty 2

## 2021-06-10 MED ORDER — OXYCODONE HCL 5 MG PO TABS
ORAL_TABLET | ORAL | Status: AC
  Filled 2021-06-10: qty 1

## 2021-06-10 MED ORDER — ACETAMINOPHEN 10 MG/ML IV SOLN: 1000.0000 mg | Freq: Once | INTRAVENOUS | Status: DC | PRN

## 2021-06-10 MED ORDER — CHLORHEXIDINE GLUCONATE 0.12 % MT SOLN
OROMUCOSAL | Status: AC
Start: 2021-06-10 — End: 2021-06-10
  Administered 2021-06-10: 15 mL via OROMUCOSAL
  Filled 2021-06-10: qty 15

## 2021-06-10 MED ORDER — SODIUM CHLORIDE 0.9 % IV SOLN
INTRAVENOUS | Status: DC | PRN
  Administered 2021-06-10: .1 ug/kg/min via INTRAVENOUS

## 2021-06-10 MED ORDER — TRAMADOL-ACETAMINOPHEN 37.5-325 MG PO TABS: 1.0000 | ORAL_TABLET | Freq: Four times a day (QID) | ORAL | 0 refills | Status: DC | PRN

## 2021-06-10 MED ORDER — FENTANYL CITRATE (PF) 100 MCG/2ML IJ SOLN
25.0000 ug | INTRAMUSCULAR | Status: DC | PRN
  Administered 2021-06-10 (×2): 25 ug via INTRAVENOUS

## 2021-06-10 MED ORDER — PHENYLEPHRINE HCL (PRESSORS) 10 MG/ML IV SOLN
INTRAVENOUS | Status: DC | PRN
  Administered 2021-06-10 (×3): 160 ug via INTRAVENOUS

## 2021-06-10 MED ORDER — DEXAMETHASONE SODIUM PHOSPHATE 10 MG/ML IJ SOLN
INTRAMUSCULAR | Status: DC | PRN
  Administered 2021-06-10: 10 mg via INTRAVENOUS

## 2021-06-10 MED ORDER — ONDANSETRON HCL 4 MG/2ML IJ SOLN
INTRAMUSCULAR | Status: AC
  Filled 2021-06-10: qty 2

## 2021-06-10 MED ORDER — ONDANSETRON HCL 4 MG/2ML IJ SOLN: 4.0000 mg | Freq: Once | INTRAMUSCULAR | Status: DC | PRN

## 2021-06-10 MED ORDER — ONDANSETRON HCL 4 MG/2ML IJ SOLN
INTRAMUSCULAR | Status: DC | PRN
  Administered 2021-06-10: 4 mg via INTRAVENOUS

## 2021-06-10 MED ORDER — LACTATED RINGERS IV SOLN: INTRAVENOUS | Status: DC

## 2021-06-10 MED ORDER — FENTANYL CITRATE (PF) 100 MCG/2ML IJ SOLN
INTRAMUSCULAR | Status: AC
  Filled 2021-06-10: qty 2

## 2021-06-10 MED ORDER — LIDOCAINE-EPINEPHRINE (PF) 1 %-1:200000 IJ SOLN
INTRAMUSCULAR | Status: DC | PRN
  Administered 2021-06-10: 3 mL

## 2021-06-10 SURGICAL SUPPLY — 37 items
BLADE SURG 15 STRL LF DISP TIS (BLADE) ×1 IMPLANT
BLADE SURG 15 STRL SS (BLADE) ×2
CORD BIP STRL DISP 12FT (MISCELLANEOUS) ×2 IMPLANT
DRAPE MAG INST 16X20 L/F (DRAPES) ×2 IMPLANT
DRSG TEGADERM 2-3/8X2-3/4 SM (GAUZE/BANDAGES/DRESSINGS) ×2 IMPLANT
DRSG TEGADERM 4X4.75 (GAUZE/BANDAGES/DRESSINGS) ×1 IMPLANT
DRSG TELFA 4X3 1S NADH ST (GAUZE/BANDAGES/DRESSINGS) ×1 IMPLANT
ELECT NEEDLE 20X.3 GREEN (MISCELLANEOUS) ×2
ELECT REM PT RETURN 9FT ADLT (ELECTROSURGICAL) ×2
ELECTRODE NDL 20X.3 GREEN (MISCELLANEOUS) IMPLANT
ELECTRODE NEEDLE 20X.3 GREEN (MISCELLANEOUS) ×1 IMPLANT
ELECTRODE REM PT RTRN 9FT ADLT (ELECTROSURGICAL) ×1 IMPLANT
FORCEPS JEWEL BIP 4-3/4 STR (INSTRUMENTS) ×2 IMPLANT
GAUZE 4X4 16PLY ~~LOC~~+RFID DBL (SPONGE) ×3 IMPLANT
GLOVE BIO SURGEON STRL SZ7.5 (GLOVE) ×4 IMPLANT
GOWN STRL REUS W/ TWL LRG LVL3 (GOWN DISPOSABLE) ×3 IMPLANT
GOWN STRL REUS W/TWL LRG LVL3 (GOWN DISPOSABLE) ×6
HEMOSTAT SURGICEL 2X3 (HEMOSTASIS) ×1 IMPLANT
HOOK STAY BLUNT/RETRACTOR 5M (MISCELLANEOUS) ×2 IMPLANT
KIT TURNOVER KIT A (KITS) ×2 IMPLANT
LABEL OR SOLS (LABEL) ×2 IMPLANT
MANIFOLD NEPTUNE II (INSTRUMENTS) ×2 IMPLANT
NS IRRIG 500ML POUR BTL (IV SOLUTION) ×2 IMPLANT
PACK HEAD/NECK (MISCELLANEOUS) ×2 IMPLANT
PROBE MONO 100X0.75 ELECT 1.9M (MISCELLANEOUS) IMPLANT
PROBE NEUROSIGN BIPOL (MISCELLANEOUS) IMPLANT
PROBE NEUROSIGN BIPOLAR (MISCELLANEOUS) ×2
SHEARS HARMONIC 9CM CVD (BLADE) ×2 IMPLANT
SPONGE KITTNER 5P (MISCELLANEOUS) ×3 IMPLANT
SUT ETHILON 3-0 (SUTURE) ×1 IMPLANT
SUT PROLENE 5 0 PS 3 (SUTURE) ×2 IMPLANT
SUT SILK 2 0 (SUTURE) ×2
SUT SILK 2 0 PERMA HAND 18 BK (SUTURE) ×2 IMPLANT
SUT SILK 2-0 18XBRD TIE 12 (SUTURE) ×1 IMPLANT
SUT VIC AB 4-0 RB1 18 (SUTURE) ×2 IMPLANT
SYSTEM CHEST DRAIN TLS 7FR (DRAIN) ×1 IMPLANT
WATER STERILE IRR 500ML POUR (IV SOLUTION) ×2 IMPLANT

## 2021-06-10 NOTE — H&P (Signed)
History and physical reviewed and will be scanned in later. No change in medical status reported by the patient or family, appears stable for surgery. All questions regarding the procedure answered, and patient (or family if a child) expressed understanding of the procedure. ? ?Cynthia Sellers S Cynthia Sellers ?@TODAY@ ?

## 2021-06-10 NOTE — Anesthesia Postprocedure Evaluation (Signed)
Anesthesia Post Note  Patient: Cynthia Sellers  Procedure(s) Performed: EXCISION SUBMANDIBULAR GLAND (Right: Neck)  Patient location during evaluation: PACU Anesthesia Type: General Level of consciousness: awake and alert, oriented and patient cooperative Pain management: pain level controlled Vital Signs Assessment: post-procedure vital signs reviewed and stable Respiratory status: spontaneous breathing, nonlabored ventilation and respiratory function stable Cardiovascular status: blood pressure returned to baseline and stable Postop Assessment: adequate PO intake Anesthetic complications: no   No notable events documented.   Last Vitals:  Vitals:   06/10/21 1100 06/10/21 1130  BP: 128/86 113/88  Pulse: 84 83  Resp: (!) 22 15  Temp: (!) 36.4 C (!) 36.4 C  SpO2: 94% 92%    Last Pain:  Vitals:   06/10/21 1130  TempSrc:   PainSc: [place]

## 2021-06-10 NOTE — Transfer of Care (Signed)
Immediate Anesthesia Transfer of Care Note  Patient: Cynthia Sellers  Procedure(s) Performed: EXCISION SUBMANDIBULAR GLAND (Right: Neck)  Patient Location: PACU  Anesthesia Type:General  Level of Consciousness: drowsy and patient cooperative  Airway & Oxygen Therapy: Patient Spontanous Breathing and Patient connected to nasal cannula oxygen  Post-op Assessment: Report given to RN and Post -op Vital signs reviewed and stable  Post vital signs: Reviewed and stable  Last Vitals:  Vitals Value Taken Time  BP 112/60 06/10/21 1027  Temp 36.3 C 06/10/21 1021  Pulse 94 06/10/21 1028  Resp 16 06/10/21 1028  SpO2 92 % 06/10/21 1028  Vitals shown include unvalidated device data.  Last Pain:  Vitals:   06/10/21 1021  TempSrc:   PainSc: 0-No pain         Complications: No notable events documented.

## 2021-06-10 NOTE — Anesthesia Procedure Notes (Signed)
Procedure Name: Intubation Date/Time: 06/10/2021 8:41 AM Performed by: Jonna Clark, CRNA Pre-anesthesia Checklist: Patient identified, Patient being monitored, Timeout performed, Emergency Drugs available and Suction available Patient Re-evaluated:Patient Re-evaluated prior to induction Oxygen Delivery Method: Circle system utilized Preoxygenation: Pre-oxygenation with 100% oxygen Induction Type: IV induction Ventilation: Mask ventilation without difficulty Laryngoscope Size: 3 and McGraph Grade View: Grade I Tube type: Oral Tube size: 7.0 mm Number of attempts: 1 Airway Equipment and Method: Stylet Placement Confirmation: ETT inserted through vocal cords under direct vision, positive ETCO2 and breath sounds checked- equal and bilateral Secured at: 21 cm Tube secured with: Tape Dental Injury: Teeth and Oropharynx as per pre-operative assessment

## 2021-06-10 NOTE — Progress Notes (Signed)
Please note patient with h/o essential tremors, states she has had since a child.

## 2021-06-10 NOTE — Op Note (Signed)
06/10/2021  10:12 AM    Cynthia Sellers  [id number]   Pre-Op Diagnosis: Right submandibular gland mass  Post-op Diagnosis: Right submandibular gland mass  Procedure: Excision of right submandibular gland  Surgeon:  Riley Nearing  Assistant: Margaretha Sheffield, MD  Anesthesia:  General endotracheal anesthesia  EBL: Less than 20 cc  Complications:  None  Findings: Firm mass involving the posterior aspect of the right submandibular gland, well encapsulated  Procedure: The patient was taken to the Operating Room and placed in the supine position.  After induction of general endotracheal anesthesia, the patient was turned 90 degrees and placed on a shoulder roll. The skin was injected along the proposed incision inferior to the right submandibular region with 1% lidocaine with epinephrine, 1: 100,000. The facial nerve monitor electrodes were placed in the usual fashion at the lower lip on the same side of the procedure. Proper functioning of the nerve monitor was assessed. The area was then prepped and draped in the usual sterile fashion.   A 15 blade was then used to incise the skin. The dissection was carried down to the subcutaneous tissues with the harmonic scalpel and through the platysma muscle. Dissection then proceeded deeper, dissecting down to the inferior margin of the submandibular gland, watching for the marginal mandibular nerve during dissection. The inferior aspect of the gland was dissected away from the digastric muscle using the harmonic scalpel, and the fascia over the gland dissected superiorly from the surface of the gland, again watching for the marginal nerve during dissection. The gland was freed anteriorly from the mylohyoid muscle and also freed posteriorly. Dissecting under the gland, care was taken to avoid injury to the hypoglossal nerve. Dissection proceeded superiorly over the lateral aspect of the gland, dividing vascular supply to the gland from the facial artery  with the harmonic scalpel. Superiorly the nerve branch from the lingual nerve was divided with the harmonic scalpel, taking care to avoid injury to the lingual nerve itself. Next the submandibular duct was identified and divided with the harmonic scalpel just under the mylohyoid muscle. The remainder of the gland was dissected out from under the mylohyoid, and handed off as a specimen.  The wound was irrigated with saline and hemostasis obtained. A #7 TLS drain was placed through a separate stab incision in the skin posterior and inferior to the incision in the neck, and secured with a 5-0 Prolene suture.  Surgicel was placed in the wound bed.  Deep closure was performed with 4-0 Vicryl suture, closing the platysma. The subcutaneous tissues were then closed with 4-0 Vicryl suture in an interrupted fashion. The skin was closed with 5-0 Prolene suture in a running locked stitch. Bacitracin ointment was applied to the wound.   The patient was then returned to the anesthesiologist for awakening, and was taken to the Recovery Room in stable condition.   Cultures:  None.   Disposition:   PACU then discharge home   Plan: Discharge home with drain in place. Follow-up tomorrow for drain removal.   Riley Nearing 06/10/2021 10:12 AM

## 2021-06-10 NOTE — Discharge Instructions (Signed)

## 2021-06-10 NOTE — Anesthesia Preprocedure Evaluation (Addendum)
Anesthesia Evaluation  Patient identified by MRN, date of birth, ID band Patient awake    Reviewed: Allergy & Precautions, NPO status , Patient's Chart, lab work & pertinent test results  History of Anesthesia Complications (+) PONV and history of anesthetic complications  Airway Mallampati: I   Neck ROM: Full    Dental  (+) Missing   Pulmonary neg pulmonary ROS,    Pulmonary exam normal breath sounds clear to auscultation       Cardiovascular hypertension, Normal cardiovascular exam Rhythm:Regular Rate:Normal  ECG 06/05/21: normal   Neuro/Psych  Headaches, PSYCHIATRIC DISORDERS Anxiety Depression    GI/Hepatic GERD  ,  Endo/Other  Obesity   Renal/GU negative Renal ROS     Musculoskeletal   Abdominal   Peds  Hematology negative hematology ROS (+)   Anesthesia Other Findings   Reproductive/Obstetrics                            Anesthesia Physical Anesthesia Plan  ASA: 2  Anesthesia Plan: General   Post-op Pain Management:    Induction: Intravenous  PONV Risk Score and Plan: 4 or greater and Ondansetron, Dexamethasone, Treatment may vary due to age or medical condition and Scopolamine patch - Pre-op  Airway Management Planned: Oral ETT  Additional Equipment:   Intra-op Plan:   Post-operative Plan: Extubation in OR  Informed Consent: I have reviewed the patients History and Physical, chart, labs and discussed the procedure including the risks, benefits and alternatives for the proposed anesthesia with the patient or authorized representative who has indicated his/her understanding and acceptance.     Dental advisory given  Plan Discussed with: CRNA  Anesthesia Plan Comments: (Patient consented for risks of anesthesia including but not limited to:  - adverse reactions to medications - damage to eyes, teeth, lips or other oral mucosa - nerve damage due to positioning  - sore  throat or hoarseness - damage to heart, brain, nerves, lungs, other parts of body or loss of life  Informed patient about role of CRNA in peri- and intra-operative care.  Patient voiced understanding.)        Anesthesia Quick Evaluation

## 2021-06-12 ENCOUNTER — Emergency Department
Admission: EM | Admit: 2021-06-12 | Discharge: 2021-06-12 | Disposition: A | Discharge status: Home / Self Care | Payer: PPO | Attending: Emergency Medicine | Admitting: Emergency Medicine

## 2021-06-12 ENCOUNTER — Emergency Department: Payer: PPO

## 2021-06-12 ENCOUNTER — Other Ambulatory Visit: Payer: Self-pay

## 2021-06-12 DIAGNOSIS — D117 Benign neoplasm of other major salivary glands: Secondary | ICD-10-CM | POA: Diagnosis not present

## 2021-06-12 DIAGNOSIS — E860 Dehydration: Secondary | ICD-10-CM | POA: Diagnosis not present

## 2021-06-12 DIAGNOSIS — R221 Localized swelling, mass and lump, neck: Secondary | ICD-10-CM | POA: Diagnosis not present

## 2021-06-12 DIAGNOSIS — E876 Hypokalemia: Secondary | ICD-10-CM | POA: Diagnosis not present

## 2021-06-12 DIAGNOSIS — G8918 Other acute postprocedural pain: Secondary | ICD-10-CM | POA: Insufficient documentation

## 2021-06-12 DIAGNOSIS — Z9889 Other specified postprocedural states: Secondary | ICD-10-CM | POA: Diagnosis not present

## 2021-06-12 DIAGNOSIS — R519 Headache, unspecified: Secondary | ICD-10-CM | POA: Insufficient documentation

## 2021-06-12 DIAGNOSIS — T888XXA Other specified complications of surgical and medical care, not elsewhere classified, initial encounter: Secondary | ICD-10-CM | POA: Diagnosis not present

## 2021-06-12 DIAGNOSIS — I10 Essential (primary) hypertension: Secondary | ICD-10-CM | POA: Insufficient documentation

## 2021-06-12 LAB — CBC WITH DIFFERENTIAL/PLATELET
Abs Immature Granulocytes: 0.02 10*3/uL (ref 0.00–0.07)
Basophils Absolute: 0 10*3/uL (ref 0.0–0.1)
Basophils Relative: 0 %
Eosinophils Absolute: 0.1 10*3/uL (ref 0.0–0.5)
Eosinophils Relative: 1 %
HCT: 38.5 % (ref 36.0–46.0)
Hemoglobin: 12.4 g/dL (ref 12.0–15.0)
Immature Granulocytes: 0 %
Lymphocytes Relative: 14 %
Lymphs Abs: 1.1 10*3/uL (ref 0.7–4.0)
MCH: 30.2 pg (ref 26.0–34.0)
MCHC: 32.2 g/dL (ref 30.0–36.0)
MCV: 93.9 fL (ref 80.0–100.0)
Monocytes Absolute: 0.6 10*3/uL (ref 0.1–1.0)
Monocytes Relative: 8 %
Neutro Abs: 5.9 10*3/uL (ref 1.7–7.7)
Neutrophils Relative %: 77 %
Platelets: 218 10*3/uL (ref 150–400)
RBC: 4.1 MIL/uL (ref 3.87–5.11)
RDW: 12.5 % (ref 11.5–15.5)
WBC: 7.7 10*3/uL (ref 4.0–10.5)
nRBC: 0 % (ref 0.0–0.2)

## 2021-06-12 LAB — BASIC METABOLIC PANEL
Anion gap: 4 — ABNORMAL LOW (ref 5–15)
BUN: 14 mg/dL (ref 6–20)
CO2: 30 mmol/L (ref 22–32)
Calcium: 8.6 mg/dL — ABNORMAL LOW (ref 8.9–10.3)
Chloride: 106 mmol/L (ref 98–111)
Creatinine, Ser: 0.61 mg/dL (ref 0.44–1.00)
GFR, Estimated: >60 mL/min (ref >60)
Glucose, Bld: 91 mg/dL (ref 70–99)
Potassium: 3.2 mmol/L — ABNORMAL LOW (ref 3.5–5.1)
Sodium: 140 mmol/L (ref 135–145)

## 2021-06-12 LAB — SURGICAL PATHOLOGY

## 2021-06-12 MED ORDER — IOHEXOL 300 MG/ML  SOLN
75.0000 mL | Freq: Once | INTRAMUSCULAR | Status: AC | PRN
Start: 2021-06-12 — End: 2021-06-12
  Administered 2021-06-12: 75 mL via INTRAVENOUS

## 2021-06-12 MED ORDER — ACETAMINOPHEN 500 MG PO TABS
1000.0000 mg | ORAL_TABLET | Freq: Once | ORAL | Status: AC
  Administered 2021-06-12: 1000 mg via ORAL
  Filled 2021-06-12: qty 2

## 2021-06-12 MED ORDER — POTASSIUM CHLORIDE CRYS ER 10 MEQ PO TBCR: 10.0000 meq | EXTENDED_RELEASE_TABLET | Freq: Two times a day (BID) | ORAL | 0 refills | Status: DC

## 2021-06-12 MED ORDER — FENTANYL CITRATE PF 50 MCG/ML IJ SOSY
50.0000 ug | PREFILLED_SYRINGE | Freq: Once | INTRAMUSCULAR | Status: AC
  Administered 2021-06-12: 50 ug via INTRAVENOUS
  Filled 2021-06-12: qty 1

## 2021-06-12 MED ORDER — LACTATED RINGERS IV BOLUS
1000.0000 mL | Freq: Once | INTRAVENOUS | Status: AC
  Administered 2021-06-12: 1000 mL via INTRAVENOUS

## 2021-06-12 MED ORDER — POTASSIUM CHLORIDE 10 MEQ/100ML IV SOLN
10.0000 meq | INTRAVENOUS | Status: DC
  Administered 2021-06-12: 10 meq via INTRAVENOUS
  Filled 2021-06-12: qty 100

## 2021-06-12 MED ORDER — OXYCODONE-ACETAMINOPHEN 5-325 MG PO TABS: 1.0000 | ORAL_TABLET | ORAL | 0 refills | Status: DC | PRN

## 2021-06-12 MED ORDER — ONDANSETRON HCL 4 MG/2ML IJ SOLN
4.0000 mg | Freq: Once | INTRAMUSCULAR | Status: AC
  Administered 2021-06-12: 4 mg via INTRAVENOUS
  Filled 2021-06-12: qty 2

## 2021-06-12 MED ORDER — OXYCODONE HCL 5 MG PO TABS
5.0000 mg | ORAL_TABLET | Freq: Once | ORAL | Status: AC
  Administered 2021-06-12: 5 mg via ORAL
  Filled 2021-06-12: qty 1

## 2021-06-12 NOTE — ED Provider Notes (Signed)
[facility] Provider Note    Event Date/Time   First MD Initiated Contact with Patient 06/12/21 [phone]     (approximate)   History   Post-op Problem   HPI  Cynthia Sellers is a 56 y.o. 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  She is also had some nausea and decreased appetite.  Also endorses mild headache.  No earache, sore throat, chest pain, cough, shortness of breath abdominal pain, back pain, left-sided neck pain, urinary symptoms or any other clear associated sick symptoms.    Past Medical History:  Diagnosis Date   Anxiety    Complication of anesthesia    Depression    GERD (gastroesophageal reflux disease)    Headache, tension type, episodic    Excedrin tension headache helps   Hypertension    Migraine    H/O MIGRAINES   PONV (postoperative nausea and vomiting)    WITH HYSTERECTOMY   Suicide attempt ([place]) 1992   Took overdose of sleeping pills   Tremor of both hands      Physical Exam  Triage Vital Signs: ED Triage Vitals  Enc Vitals Group     BP 06/12/21 0532 132/84     Pulse Rate 06/12/21 0532 88     Resp 06/12/21 0532 18     Temp 06/12/21 0532 98.2 F (36.8 C)     Temp Source 06/12/21 0532 Oral     SpO2 06/12/21 0532 96 %     Weight 06/12/21 0516 181 lb (82.1 kg)     Height 06/12/21 0516 '5\' 3"'$  (1.6 m)     Head Circumference --      Peak Flow --      Pain Score 06/12/21 0516 9     Pain Loc --      Pain Edu? --      Excl. in [place]? --     Most recent vital signs: Vitals:   06/12/21 0532  BP: 132/84  Pulse: 88  Resp: 18  Temp: 98.2 F (36.8 C)  SpO2: 96%    General: Awake, peers  uncomfortable.  Very dry mucous membranes. CV:  Good peripheral perfusion.  2+ radial pulse. Resp:  Normal effort.  Clear bilaterally. Abd:  No distention.  Soft throughout. Other:  Oropharynx is unremarkable aside from dry mucous membranes.  The right mandibular barely a and right neck has some surrounding erythema edema and tenderness.  Drain is in place with some serosanguineous fluid in the collection tube.  Remainder of neck is grossly unremarkable.  Patient does not seem meningitic.  On partial removal of the dressing drain appears in place with suture holding it.  There is some purulent drainage around insertion site.   ED Results / Procedures / Treatments  Labs (all labs ordered are listed, but only abnormal results are displayed) Labs Reviewed  BASIC METABOLIC PANEL - Abnormal; Notable for the following components:      Result Value   Potassium 3.2 (*)    Calcium 8.6 (*)    Anion gap 4 (*)    All other components within normal limits  CULTURE, BLOOD (ROUTINE X 2)  CULTURE, BLOOD (ROUTINE X 2)  AEROBIC/ANAEROBIC CULTURE  W GRAM STAIN (SURGICAL/DEEP WOUND)  CBC WITH DIFFERENTIAL/PLATELET     EKG    RADIOLOGY    PROCEDURES:  Critical Care performed: No  Procedures   MEDICATIONS ORDERED IN ED: Medications  potassium chloride 10 mEq in 100 mL IVPB (has no administration in time range)  ondansetron (ZOFRAN) injection 4 mg (4 mg Intravenous Given 06/12/21 0612)  lactated ringers bolus 1,000 mL (1,000 mLs Intravenous New Bag/Given 06/12/21 0619)  fentaNYL (SUBLIMAZE) injection 50 mcg (50 mcg Intravenous Given 06/12/21 4709)     IMPRESSION / MDM / ASSESSMENT AND PLAN / ED COURSE  I reviewed the triage vital signs and the nursing notes. Patient's presentation is most consistent with acute presentation with potential threat to life or bodily function.                               Differential diagnosis includes, but is not limited to hematoma, infection versus possible  trauma from dislodged tubing.  No hemodynamic instability or fever on arrival and patient does not appear septic or meningitic on arrival.  We will order CT head of the neck as well as wound culture from the insertion site to medicine purulent drainage as well as blood cultures.  Patient does not seem obviously septic at this time.  He does appear dehydrated and well pending initial work-up will give some IV fluids Zofran and fentanyl for pain.  CBC without leukocytosis or acute anemia.  BMP shows a K of 3.2 but no other significant electrolyte or metabolic derangements.  Blood and wound cultures obtained.  Patient signed over to assuming provider approximately 0 700 with plan to follow-up CT and reassess and likely reach out to ENT pending CT results.  Patient does state she has appointment to see Dr. Richardson Landry later this afternoon.   FINAL CLINICAL IMPRESSION(S) / ED DIAGNOSES   Final diagnoses:  Post-operative pain  Hypokalemia  Dehydration     Rx / DC Orders   ED Discharge Orders     None        Note:  This document was prepared using Dragon voice recognition software and may include unintentional dictation errors.   Lucrezia Starch, MD 06/12/21 [phone]

## 2021-06-12 NOTE — Discharge Instructions (Addendum)
Please follow-up with your ENT surgeon today as scheduled.  Please take your pain medication as needed but only as written.  Do not take your newly prescribed pain medication with tramadol.  Do not drink alcohol or drive while taking this medication.  Return to the emergency department for any worsening pain swelling difficulty breathing swallowing or development of fever.

## 2021-06-12 NOTE — ED Provider Notes (Signed)
-----------------------------------------   8:25 AM on 06/12/2021 ----------------------------------------- CT scan has resulted showing small fluid collection just outside of the drain.  I spoke to Dr. Kathyrn Sheriff, who spoke with Dr. Richardson Landry.  Patient has a follow-up appointment at 1:30 PM in the office.  He states he will see the patient in the office likely perform a needle aspiration.  Patient agreeable to plan of care.  Patient is complaining of some pain, was only sent home with tramadol per patient we will prescribe a short course of Percocet for the patient as well as a short course of potassium supplementation.  Patient agreeable to plan of care.  Patient has received just over 1 run of 10 mEq of potassium in the emergency department.   Harvest Dark, MD 06/12/21 [phone]

## 2021-06-12 NOTE — ED Triage Notes (Signed)
Pt in with co swelling to right neck, states had gland removed Wednesday with drain in place. States drain is leaking around the tube.

## 2021-06-17 LAB — CULTURE, BLOOD (ROUTINE X 2)
Culture: NO GROWTH
Culture: NO GROWTH
Special Requests: ADEQUATE

## 2021-06-17 LAB — AEROBIC/ANAEROBIC CULTURE W GRAM STAIN (SURGICAL/DEEP WOUND)

## 2021-06-18 NOTE — Progress Notes (Signed)
ED Antimicrobial Stewardship Positive Culture Follow Up   Cynthia Sellers is an 56 y.o. female who presented to [facility] on 06/12/2021 with a chief complaint of  Chief Complaint  Patient presents with   Post-op Problem    Recent Results (from the past 720 hour(s))  Blood culture (routine x 2)     Status: None   Collection Time: 06/12/21  6:02 AM   Specimen: BLOOD  Result Value Ref Range Status   Specimen Description BLOOD RIGHT [facility]  Final   Special Requests   Final    BOTTLES DRAWN AEROBIC AND ANAEROBIC Blood Culture adequate volume   Culture   Final    NO GROWTH 5 DAYS Performed at [facility], [address], [place], [place] [address]    Report Status 06/17/2021 FINAL  Final  Blood culture (routine x 2)     Status: None   Collection Time: 06/12/21  6:02 AM   Specimen: BLOOD  Result Value Ref Range Status   Specimen Description BLOOD LEFT AC  Final   Special Requests   Final    BOTTLES DRAWN AEROBIC AND ANAEROBIC Blood Culture results may not be optimal due to an inadequate volume of blood received in culture bottles   Culture   Final    NO GROWTH 5 DAYS Performed at [facility], [address] [place], [place] [address]    Report Status 06/17/2021 FINAL  Final  Aerobic/Anaerobic Culture w Gram Stain (surgical/deep wound)     Status: None   Collection Time: 06/12/21  7:43 AM   Specimen: Neck  Result Value Ref Range Status   Specimen Description   Final    NECK RIGHT NECK Performed at [facility], [address], [place], [place] [address]    Special Requests   Final    NONE Performed at [facility], [address] [place], [place] [address]    Gram Stain   Final    RARE WBC PRESENT, PREDOMINANTLY MONONUCLEAR RARE GRAM POSITIVE COCCI IN CLUSTERS    Culture   Final    FEW STAPHYLOCOCCUS AUREUS NO ANAEROBES ISOLATED Performed at [facility], [place] [address], [place], [place] [address]    Report Status  06/17/2021 FINAL  Final   Organism ID, Bacteria STAPHYLOCOCCUS AUREUS  Final      Susceptibility   Staphylococcus aureus - MIC*    CIPROFLOXACIN <=0.5 SENSITIVE Sensitive     ERYTHROMYCIN <=0.25 SENSITIVE Sensitive     GENTAMICIN <=0.5 SENSITIVE Sensitive     OXACILLIN <=0.25 SENSITIVE Sensitive     TETRACYCLINE <=1 SENSITIVE Sensitive     VANCOMYCIN 1 SENSITIVE Sensitive     TRIMETH/SULFA <=10 SENSITIVE Sensitive     CLINDAMYCIN <=0.25 SENSITIVE Sensitive     RIFAMPIN <=0.5 SENSITIVE Sensitive     Inducible Clindamycin NEGATIVE Sensitive     * FEW STAPHYLOCOCCUS AUREUS     '[x]'$  Patient discharged originally without antimicrobial agent and treatment is now indicated - patient has f/u appt later in afternoon on day presented to ED.  She was started on doxycyline which was susceptible  New antibiotic prescription: N/A  Provider: Dr Clyde Canterbury (ENT)  Doreene Eland, PharmD, BCPS, BCIDP Work Cell: [phone] 06/18/2021 8:55 AM

## 2021-06-19 ENCOUNTER — Ambulatory Visit: Payer: PPO | Admitting: Nurse Practitioner

## 2021-07-08 NOTE — Progress Notes (Unsigned)
There were no vitals taken for this visit.   Subjective:    Patient ID: Cynthia Sellers, female    DOB: 1965/06/27, 56 y.o.   MRN: [id number]  HPI: Cynthia Sellers is a 55 y.o. female  No chief complaint on file.  HYPERTENSION Hypertension status: uncontrolled  Satisfied with current treatment? no Duration of hypertension: years BP monitoring frequency:  not checking BP range:  BP medication side effects:  no Medication compliance: excellent compliance Previous BP meds:valsartan Aspirin: no Recurrent headaches: yes Visual changes: no Palpitations: no Dyspnea: no Chest pain: no Lower extremity edema: no Dizzy/lightheaded: no    Relevant past medical, surgical, family and social history reviewed and updated as indicated. Interim medical history since our last visit reviewed. Allergies and medications reviewed and updated.  Review of Systems  Eyes:  Negative for visual disturbance.  Respiratory:  Negative for cough, chest tightness and shortness of breath.   Cardiovascular:  Negative for chest pain, palpitations and leg swelling.  Neurological:  Positive for tremors and headaches. Negative for dizziness.  Psychiatric/Behavioral:  Positive for dysphoric mood. Negative for sleep disturbance and suicidal ideas. The patient is nervous/anxious.     Per HPI unless specifically indicated above     Objective:    There were no vitals taken for this visit.  Wt Readings from Last 3 Encounters:  06/12/21 181 lb (82.1 kg)  06/10/21 179 lb (81.2 kg)  05/19/21 181 lb 6.4 oz (82.3 kg)    Physical Exam Vitals and nursing note reviewed.  Constitutional:      General: She is not in acute distress.    Appearance: Normal appearance. She is normal weight. She is not ill-appearing, toxic-appearing or diaphoretic.  HENT:     Head: Normocephalic.     Right Ear: External ear normal.     Left Ear: External ear normal.     Nose: Nose normal.     Mouth/Throat:     Mouth:  Mucous membranes are moist.     Pharynx: Oropharynx is clear.  Eyes:     General:        Right eye: No discharge.        Left eye: No discharge.     Extraocular Movements: Extraocular movements intact.     Conjunctiva/sclera: Conjunctivae normal.     Pupils: Pupils are equal, round, and reactive to light.  Cardiovascular:     Rate and Rhythm: Normal rate and regular rhythm.     Heart sounds: No murmur heard. Pulmonary:     Effort: Pulmonary effort is normal. No respiratory distress.     Breath sounds: Normal breath sounds. No wheezing or rales.  Musculoskeletal:     Cervical back: Normal range of motion and neck supple.  Skin:    General: Skin is warm and dry.     Capillary Refill: Capillary refill takes less than 2 seconds.  Neurological:     General: No focal deficit present.     Mental Status: She is alert and oriented to person, place, and time. Mental status is at baseline.  Psychiatric:        Mood and Affect: Mood normal.        Behavior: Behavior normal.        Thought Content: Thought content normal.        Judgment: Judgment normal.     Results for orders placed or performed during the hospital encounter of 06/12/21  Blood culture (routine x 2)   Specimen: BLOOD  Result Value Ref Range   Specimen Description BLOOD RIGHT AC    Special Requests      BOTTLES DRAWN AEROBIC AND ANAEROBIC Blood Culture adequate volume   Culture      NO GROWTH 5 DAYS Performed at [facility], [address] [place], [place] [address]    Report Status 06/17/2021 FINAL   Blood culture (routine x 2)   Specimen: BLOOD  Result Value Ref Range   Specimen Description BLOOD LEFT AC    Special Requests      BOTTLES DRAWN AEROBIC AND ANAEROBIC Blood Culture results may not be optimal due to an inadequate volume of blood received in culture bottles   Culture      NO GROWTH 5 DAYS Performed at [facility], [address], [place], [place] [address]    Report Status  06/17/2021 FINAL   Aerobic/Anaerobic Culture w Gram Stain (surgical/deep wound)   Specimen: Neck  Result Value Ref Range   Specimen Description      NECK RIGHT NECK Performed at [facility], [address] [place], [place] [address]    Special Requests      NONE Performed at [facility], [address] [place], [place] [address]    Gram Stain      RARE WBC PRESENT, PREDOMINANTLY MONONUCLEAR RARE GRAM POSITIVE COCCI IN CLUSTERS    Culture      FEW STAPHYLOCOCCUS AUREUS NO ANAEROBES ISOLATED Performed at [facility], [place] [address] [place],  [address]    Report Status 06/17/2021 FINAL    Organism ID, Bacteria STAPHYLOCOCCUS AUREUS       Susceptibility   Staphylococcus aureus - MIC*    CIPROFLOXACIN <=0.5 SENSITIVE Sensitive     ERYTHROMYCIN <=0.25 SENSITIVE Sensitive     GENTAMICIN <=0.5 SENSITIVE Sensitive     OXACILLIN <=0.25 SENSITIVE Sensitive     TETRACYCLINE <=1 SENSITIVE Sensitive     VANCOMYCIN 1 SENSITIVE Sensitive     TRIMETH/SULFA <=10 SENSITIVE Sensitive     CLINDAMYCIN <=0.25 SENSITIVE Sensitive     RIFAMPIN <=0.5 SENSITIVE Sensitive     Inducible Clindamycin NEGATIVE Sensitive     * FEW STAPHYLOCOCCUS AUREUS  CBC with Differential/Platelet  Result Value Ref Range   WBC 7.7 4.0 - 10.5 K/uL   RBC 4.10 3.87 - 5.11 MIL/uL   Hemoglobin 12.4 12.0 - 15.0 g/dL   HCT 38.5 36.0 - 46.0 %   MCV 93.9 80.0 - 100.0 fL   MCH 30.2 26.0 - 34.0 pg   MCHC 32.2 30.0 - 36.0 g/dL   RDW 12.5 11.5 - 15.5 %   Platelets 218 150 - 400 K/uL   nRBC 0.0 0.0 - 0.2 %   Neutrophils Relative % 77 %   Neutro Abs 5.9 1.7 - 7.7 K/uL   Lymphocytes Relative 14 %   Lymphs Abs 1.1 0.7 - 4.0 K/uL   Monocytes Relative 8 %   Monocytes Absolute 0.6 0.1 - 1.0 K/uL   Eosinophils Relative 1 %   Eosinophils Absolute 0.1 0.0 - 0.5 K/uL   Basophils Relative 0 %   Basophils Absolute 0.0 0.0 - 0.1 K/uL   Immature Granulocytes 0 %   Abs Immature Granulocytes  0.02 0.00 - 0.07 K/uL  Basic metabolic panel  Result Value Ref Range   Sodium 140 135 - 145 mmol/L   Potassium 3.2 (L) 3.5 - 5.1 mmol/L   Chloride 106 98 - 111 mmol/L   CO2 30 22 -  32 mmol/L   Glucose, Bld 91 70 - 99 mg/dL   BUN 14 6 - 20 mg/dL   Creatinine, Ser 0.61 0.44 - 1.00 mg/dL   Calcium 8.6 (L) 8.9 - 10.3 mg/dL   GFR, Estimated >60 >60 mL/min   Anion gap 4 (L) 5 - 15      Assessment & Plan:   Problem List Items Addressed This Visit       Cardiovascular and Mediastinum   Primary hypertension - Primary     Follow up plan: No follow-ups on file.

## 2021-07-09 ENCOUNTER — Encounter: Payer: Self-pay | Admitting: Nurse Practitioner

## 2021-07-09 ENCOUNTER — Ambulatory Visit (INDEPENDENT_AMBULATORY_CARE_PROVIDER_SITE_OTHER): Payer: PPO | Admitting: Nurse Practitioner

## 2021-07-09 VITALS — BP 132/85 | HR 65 | Temp 97.9°F | Wt 178.8 lb

## 2021-07-09 DIAGNOSIS — I10 Essential (primary) hypertension: Secondary | ICD-10-CM

## 2021-07-09 MED ORDER — VALSARTAN 40 MG PO TABS: 40.0000 mg | ORAL_TABLET | Freq: Every day | ORAL | 1 refills | Status: DC

## 2021-07-09 NOTE — Assessment & Plan Note (Signed)
Chronic.  Controlled.  Continue with current medication regimen of Valsartan 40mg daily.  Labs ordered today.  Return to clinic in 6 months for reevaluation.  Call sooner if concerns arise.   

## 2021-07-09 NOTE — Patient Instructions (Signed)
[phone]- Phone number for neurology office.

## 2021-07-13 DIAGNOSIS — Z8659 Personal history of other mental and behavioral disorders: Secondary | ICD-10-CM | POA: Diagnosis not present

## 2021-07-13 DIAGNOSIS — G479 Sleep disorder, unspecified: Secondary | ICD-10-CM | POA: Diagnosis not present

## 2021-07-13 DIAGNOSIS — F419 Anxiety disorder, unspecified: Secondary | ICD-10-CM | POA: Diagnosis not present

## 2021-07-13 DIAGNOSIS — R251 Tremor, unspecified: Secondary | ICD-10-CM | POA: Diagnosis not present

## 2021-08-03 DIAGNOSIS — F419 Anxiety disorder, unspecified: Secondary | ICD-10-CM | POA: Diagnosis not present

## 2021-08-03 DIAGNOSIS — Z8659 Personal history of other mental and behavioral disorders: Secondary | ICD-10-CM | POA: Diagnosis not present

## 2021-08-03 DIAGNOSIS — G479 Sleep disorder, unspecified: Secondary | ICD-10-CM | POA: Diagnosis not present

## 2021-08-03 DIAGNOSIS — R251 Tremor, unspecified: Secondary | ICD-10-CM | POA: Diagnosis not present

## 2021-08-26 ENCOUNTER — Other Ambulatory Visit: Payer: Self-pay

## 2021-08-26 DIAGNOSIS — Z1231 Encounter for screening mammogram for malignant neoplasm of breast: Secondary | ICD-10-CM

## 2021-09-08 ENCOUNTER — Other Ambulatory Visit: Payer: Self-pay | Admitting: Gastroenterology

## 2021-09-10 ENCOUNTER — Telehealth: Payer: Self-pay

## 2021-09-10 MED ORDER — OMEPRAZOLE 20 MG PO CPDR: 20.0000 mg | DELAYED_RELEASE_CAPSULE | Freq: Two times a day (BID) | ORAL | 1 refills | Status: DC

## 2021-09-10 NOTE — Telephone Encounter (Signed)
Medication refill request for Omeprazole 20 MG # 180. Last fill date 02/12/2021. Last office visit on 07/09/2021.  Upcoming appt scheduled for 10/12/2021. Please advise.

## 2021-09-10 NOTE — Telephone Encounter (Signed)
Medication sent in. 

## 2021-09-10 NOTE — Addendum Note (Signed)
Addended by: Jon Billings on: 09/10/2021 12:24 PM   Modules accepted: Orders

## 2021-09-11 ENCOUNTER — Telehealth: Payer: Self-pay

## 2021-09-11 NOTE — Telephone Encounter (Signed)
Called and LVM asking for patient to please return my call. Patient needs to have a Welcome to Medicare visit completed by the end of September if at all possible.

## 2021-09-15 DIAGNOSIS — R251 Tremor, unspecified: Secondary | ICD-10-CM | POA: Diagnosis not present

## 2021-09-15 DIAGNOSIS — Z8659 Personal history of other mental and behavioral disorders: Secondary | ICD-10-CM | POA: Diagnosis not present

## 2021-09-15 DIAGNOSIS — G479 Sleep disorder, unspecified: Secondary | ICD-10-CM | POA: Diagnosis not present

## 2021-09-15 DIAGNOSIS — F419 Anxiety disorder, unspecified: Secondary | ICD-10-CM | POA: Diagnosis not present

## 2021-09-16 ENCOUNTER — Ambulatory Visit
Admission: RE | Admit: 2021-09-16 | Discharge: 2021-09-16 | Disposition: A | Discharge status: Home / Self Care | Payer: PPO | Source: Ambulatory Visit | Attending: Nurse Practitioner | Admitting: Nurse Practitioner

## 2021-09-16 DIAGNOSIS — Z1231 Encounter for screening mammogram for malignant neoplasm of breast: Secondary | ICD-10-CM | POA: Diagnosis not present

## 2021-09-17 NOTE — Progress Notes (Signed)
Please let patient know her Mammogram did not show any evidence of a malignancy.  The recommendation is to repeat the Mammogram in 1 year.  

## 2021-10-12 ENCOUNTER — Ambulatory Visit: Payer: PPO | Admitting: Nurse Practitioner

## 2021-10-20 ENCOUNTER — Encounter: Payer: PPO | Admitting: Nurse Practitioner

## 2021-10-26 ENCOUNTER — Ambulatory Visit (INDEPENDENT_AMBULATORY_CARE_PROVIDER_SITE_OTHER): Payer: PPO | Admitting: Nurse Practitioner

## 2021-10-26 ENCOUNTER — Encounter: Payer: Self-pay | Admitting: Nurse Practitioner

## 2021-10-26 VITALS — BP 132/84 | HR 72 | Temp 98.3°F | Ht 62.99 in | Wt 178.1 lb

## 2021-10-26 DIAGNOSIS — F330 Major depressive disorder, recurrent, mild: Secondary | ICD-10-CM

## 2021-10-26 DIAGNOSIS — I10 Essential (primary) hypertension: Secondary | ICD-10-CM

## 2021-10-26 DIAGNOSIS — Z7189 Other specified counseling: Secondary | ICD-10-CM

## 2021-10-26 DIAGNOSIS — G250 Essential tremor: Secondary | ICD-10-CM | POA: Diagnosis not present

## 2021-10-26 DIAGNOSIS — F419 Anxiety disorder, unspecified: Secondary | ICD-10-CM | POA: Diagnosis not present

## 2021-10-26 DIAGNOSIS — Z0000 Encounter for general adult medical examination without abnormal findings: Secondary | ICD-10-CM

## 2021-10-26 DIAGNOSIS — Z136 Encounter for screening for cardiovascular disorders: Secondary | ICD-10-CM

## 2021-10-26 DIAGNOSIS — N3941 Urge incontinence: Secondary | ICD-10-CM

## 2021-10-26 DIAGNOSIS — Z23 Encounter for immunization: Secondary | ICD-10-CM

## 2021-10-26 LAB — MICROSCOPIC EXAMINATION
Bacteria, UA: NONE SEEN
RBC, Urine: NONE SEEN /hpf (ref 0–2)

## 2021-10-26 LAB — URINALYSIS, ROUTINE W REFLEX MICROSCOPIC
Bilirubin, UA: NEGATIVE
Glucose, UA: NEGATIVE
Ketones, UA: NEGATIVE
Nitrite, UA: NEGATIVE
Protein,UA: NEGATIVE
RBC, UA: NEGATIVE
Specific Gravity, UA: 1.015 (ref 1.005–1.030)
Urobilinogen, Ur: 0.2 mg/dL (ref 0.2–1.0)
pH, UA: 5.5 (ref 5.0–7.5)

## 2021-10-26 MED ORDER — VALSARTAN 40 MG PO TABS
40.0000 mg | ORAL_TABLET | Freq: Every day | ORAL | 1 refills | Status: DC
Start: 2021-10-26 — End: 2022-02-09

## 2021-10-26 NOTE — Patient Instructions (Signed)
[facility]

## 2021-10-26 NOTE — Progress Notes (Signed)
BP 132/84   Pulse 72   Temp 98.3 F (36.8 C) (Oral)   Ht 5' 2.99" (1.6 m)   Wt 178 lb 1.6 oz (80.8 kg)   SpO2 98%   BMI 31.56 kg/m    Subjective:    Patient ID: Cynthia Sellers, female    DOB: 07/12/1965, 56 y.o.   MRN: [id number]  HPI: Cynthia Sellers is a 56 y.o. female presenting on 10/26/2021 for comprehensive medical examination. Current medical complaints include:none  She currently lives with: Menopausal Symptoms: no  TREMOR Patient states she has been seeing Dr. Melrose Nakayama for her tremor.  She is still working on different medication for the tremor.  He increased her Seroquel and gave her Klonopin.    ANXIETY Patient states the Klonopin is working okay.  But she still gets depressed at times.  She is still taking the Lexapro also. Denies SI.   URINARY URGENCY Patient states she has urinary urgency and frequency.  States she has had a bladder prolapse in the past.  Does not see Urology.  Getting up 3-4 times per night to urinate.  Functional Status Survey:       10/26/2021   11:01 AM 07/09/2021   11:06 AM 05/19/2021   10:24 AM 02/19/2021   10:56 AM 01/19/2021   11:21 AM  Fall Risk   Falls in the past year? 0 0 0 0 0  Number falls in past yr: 0 0 0 0 0  Injury with Fall? 0 0 0 0 0  Risk for fall due to : No Fall Risks No Fall Risks No Fall Risks No Fall Risks No Fall Risks  Follow up Falls evaluation completed Falls evaluation completed Falls evaluation completed Falls evaluation completed Falls evaluation completed    Depression Screen    10/26/2021   11:01 AM 07/09/2021   11:06 AM 05/19/2021   10:25 AM 02/19/2021   10:57 AM 01/19/2021   11:21 AM  Depression screen PHQ 2/9  Decreased Interest 2 0 1 0 0  Down, Depressed, Hopeless 1 2 0 1 3  PHQ - 2 Score '3 2 1 1 3  '$ Altered sleeping '2 2 3 2 3  '$ Tired, decreased energy 1 0 0 0 0  Change in appetite 3 0 1 3 0  Feeling bad or failure about yourself  1 0 0 0 1  Trouble concentrating 1 1 0 0 0  Moving slowly  or fidgety/restless 0 0 0 0 0  Suicidal thoughts 0 0 0 0 0  PHQ-9 Score '11 5 5 6 7  '$ Difficult doing work/chores  Somewhat difficult  Somewhat difficult Somewhat difficult     Advanced Directives Does patient have a HCPOA?    no If yes, name and contact information:  Does patient have a living will or MOST form?  no  Past Medical History:  Past Medical History:  Diagnosis Date   Anxiety    Complication of anesthesia    Depression    GERD (gastroesophageal reflux disease)    Headache, tension type, episodic    Excedrin tension headache helps   Hypertension    Migraine    H/O MIGRAINES   PONV (postoperative nausea and vomiting)    WITH HYSTERECTOMY   Suicide attempt ([place]) 1992   Took overdose of sleeping pills   Tremor of both hands     Surgical History:  Past Surgical History:  Procedure Laterality Date   BILATERAL SALPINGOOPHORECTOMY  01/04/2010   COLONOSCOPY WITH PROPOFOL N/A  11/10/2020   Procedure: COLONOSCOPY WITH PROPOFOL;  Surgeon: Lin Landsman, MD;  Location: [facility] ENDOSCOPY;  Service: Gastroenterology;  Laterality: N/A;   ESOPHAGOGASTRODUODENOSCOPY N/A 11/10/2020   Procedure: ESOPHAGOGASTRODUODENOSCOPY (EGD);  Surgeon: Lin Landsman, MD;  Location[facility]COPY;  Service: Gastroenterology;  Laterality: N/A;   HEEL SPUR EXCISION Left 01/05/2007   LIPOMA EXCISION Left 07/13/2017   Procedure: EXCISION LIPOMA-LEFT BREAST;  Surgeon: Robert Bellow, MD;  Location: [facility] ORS;  Service: General;  Laterality: Left;   SUBMANDIBULAR GLAND EXCISION Right 06/10/2021   Procedure: EXCISION SUBMANDIBULAR GLAND;  Surgeon: Clyde Canterbury, MD;  Location: [facility] ORS;  Service: ENT;  Laterality: Right;   TOTAL ABDOMINAL HYSTERECTOMY W/ BILATERAL SALPINGOOPHORECTOMY  01/04/2010    Medications:  Current Outpatient Medications on File Prior to Visit  Medication Sig   busPIRone (BUSPAR) 5 MG tablet Take 1 tablet (5 mg total) by mouth 2 (two) times daily.   clonazePAM  (KLONOPIN) 0.5 MG tablet Take 0.25 mg twice daily for one week. Then, increase to 0.25 mg twice daily, continue with this dose.   escitalopram (LEXAPRO) 10 MG tablet Take 1 tablet (10 mg total) by mouth daily. (Patient taking differently: Take 10 mg by mouth every morning.)   omeprazole (PRILOSEC) 20 MG capsule Take 1 capsule (20 mg total) by mouth 2 (two) times daily before a meal. Take 1 cap by mouth twice daily   oxyCODONE-acetaminophen (PERCOCET) 5-325 MG tablet Take 1 tablet by mouth every 4 (four) hours as needed for severe pain.   potassium chloride (KLOR-CON M) 10 MEQ tablet Take 1 tablet (10 mEq total) by mouth 2 (two) times daily.   QUEtiapine (SEROQUEL) 25 MG tablet Take 1 tablet (25 mg total) by mouth at bedtime.   traMADol-acetaminophen (ULTRACET) 37.5-325 MG tablet Take 1-2 tablets by mouth every 6 (six) hours as needed.   No current facility-administered medications on file prior to visit.    Allergies:  Allergies  Allergen Reactions   Vicodin [Hydrocodone-Acetaminophen] Nausea And Vomiting    Social History:  Social History   Socioeconomic History   Marital status: Widowed    Spouse name: Not on file   Number of children: 2   Years of education: 10   Highest education level: Not on file  Occupational History    Employer[place]  Tobacco Use   Smoking status: Never   Smokeless tobacco: Never  Vaping Use   Vaping Use: Never used  Substance and Sexual Activity   Alcohol use: Never   Drug use: Never   Sexual activity: Not Currently    Birth control/protection: Surgical  Other Topics Concern   Not on file  Social History Narrative   Not on file   Social Determinants of Health   Financial Resource Strain: Not on file  Food Insecurity: Not on file  Transportation Needs: Not on file  Physical Activity: Not on file  Stress: Not on file  Social Connections: Not on file  Intimate Partner Violence: Not on file   Social History   Tobacco Use   Smoking Status Never  Smokeless Tobacco Never   Social History   Substance and Sexual Activity  Alcohol Use Never    Family History:  Family History  Problem Relation Age of Onset   Heart disease Father 32   Tremor Father    Arthritis Paternal Grandmother    Heart disease Paternal Grandmother    Hypertension Paternal Grandmother    Heart disease Paternal Grandfather  Stroke Paternal Grandfather    Hypertension Paternal Grandfather    Cancer Brother        throat and brain cancer   Heart disease Brother        deceased from MI   Tremor Brother    Breast cancer Neg Hx     Past medical history, surgical history, medications, allergies, family history and social history reviewed with patient today and changes made to appropriate areas of the chart.   Review of Systems  Eyes:  Negative for blurred vision and double vision.  Respiratory:  Negative for shortness of breath.   Cardiovascular:  Negative for chest pain, palpitations and leg swelling.  Genitourinary:  Positive for frequency and urgency.  Neurological:  Positive for tremors. Negative for dizziness and headaches.  Psychiatric/Behavioral:  Positive for depression. The patient is nervous/anxious.     All other ROS negative except what is listed above and in the HPI.      Objective:    BP 132/84   Pulse 72   Temp 98.3 F (36.8 C) (Oral)   Ht 5' 2.99" (1.6 m)   Wt 178 lb 1.6 oz (80.8 kg)   SpO2 98%   BMI 31.56 kg/m   Wt Readings from Last 3 Encounters:  10/26/21 178 lb 1.6 oz (80.8 kg)  07/09/21 178 lb 12.8 oz (81.1 kg)  06/12/21 181 lb (82.1 kg)    Hearing Screening   '500Hz'$  '1000Hz'$  '2000Hz'$  '4000Hz'$   Right ear Pass Pass Pass Pass  Left ear Pass Pass Pass Pass   Vision Screening   Right eye Left eye Both eyes  Without correction '20/70 20/70 20/70 '$  With correction       Physical Exam Vitals and nursing note reviewed.  Constitutional:      General: She is awake. She is not in acute distress.     Appearance: Normal appearance. She is well-developed. She is obese. She is not ill-appearing.  HENT:     Head: Normocephalic and atraumatic.     Right Ear: Hearing, tympanic membrane, ear canal and external ear normal. No drainage.     Left Ear: Hearing, tympanic membrane, ear canal and external ear normal. No drainage.     Nose: Nose normal.     Right Sinus: No maxillary sinus tenderness or frontal sinus tenderness.     Left Sinus: No maxillary sinus tenderness or frontal sinus tenderness.     Mouth/Throat:     Mouth: Mucous membranes are moist.     Pharynx: Oropharynx is clear. Uvula midline. No pharyngeal swelling, oropharyngeal exudate or posterior oropharyngeal erythema.  Eyes:     General: Lids are normal.        Right eye: No discharge.        Left eye: No discharge.     Extraocular Movements: Extraocular movements intact.     Conjunctiva/sclera: Conjunctivae normal.     Pupils: Pupils are equal, round, and reactive to light.     Visual Fields: Right eye visual fields normal and left eye visual fields normal.  Neck:     Thyroid: No thyromegaly.     Vascular: No carotid bruit.     Trachea: Trachea normal.  Cardiovascular:     Rate and Rhythm: Normal rate and regular rhythm.     Heart sounds: Normal heart sounds. No murmur heard.    No gallop.  Pulmonary:     Effort: Pulmonary effort is normal. No accessory muscle usage or respiratory distress.     Breath  sounds: Normal breath sounds.  Chest:  Breasts:    Right: Normal.     Left: Normal.  Abdominal:     General: Bowel sounds are normal.     Palpations: Abdomen is soft. There is no hepatomegaly or splenomegaly.     Tenderness: There is no abdominal tenderness.  Musculoskeletal:        General: Normal range of motion.     Cervical back: Normal range of motion and neck supple.     Right lower leg: No edema.     Left lower leg: No edema.  Lymphadenopathy:     Head:     Right side of head: No submental, submandibular,  tonsillar, preauricular or posterior auricular adenopathy.     Left side of head: No submental, submandibular, tonsillar, preauricular or posterior auricular adenopathy.     Cervical: No cervical adenopathy.     Upper Body:     Right upper body: No supraclavicular, axillary or pectoral adenopathy.     Left upper body: No supraclavicular, axillary or pectoral adenopathy.  Skin:    General: Skin is warm and dry.     Capillary Refill: Capillary refill takes less than 2 seconds.     Findings: No rash.  Neurological:     Mental Status: She is alert and oriented to person, place, and time.     Gait: Gait is intact.     Deep Tendon Reflexes: Reflexes are normal and symmetric.     Reflex Scores:      Brachioradialis reflexes are 2+ on the right side and 2+ on the left side.      Patellar reflexes are 2+ on the right side and 2+ on the left side. Psychiatric:        Attention and Perception: Attention normal.        Mood and Affect: Mood normal.        Speech: Speech normal.        Behavior: Behavior normal. Behavior is cooperative.        Thought Content: Thought content normal.        Judgment: Judgment normal.         No data to display          Cognitive Testing - 6-CIT  Correct? Score   What year is it? yes 0 Yes = 0    No = 4  What month is it? yes 0 Yes = 0    No = 3  Remember:     Pia [address], [place], [place]     What time is it? yes 0 Yes = 0    No = 3  Count backwards from 20 to 1 yes 0 Correct = 0    1 error = 2   More than 1 error = 4  Say the months of the year in reverse. yes 0 Correct = 0    1 error = 2   More than 1 error = 4  What address did I ask you to remember? no 4 Correct = 0  1 error = 2    2 error = 4    3 error = 6    4 error = 8    All wrong = 10       TOTAL SCORE  4/28   Interpretation:  Normal  Normal (0-7) Abnormal (8-28)   Results for orders placed or performed in visit on 10/26/21  Microscopic Examination   Urine  Result  Value Ref  Range   WBC, UA 0-5 0 - 5 /hpf   RBC, Urine None seen 0 - 2 /hpf   Epithelial Cells (non renal) 0-10 0 - 10 /hpf   Bacteria, UA None seen None seen/Few  Urinalysis, Routine w reflex microscopic  Result Value Ref Range   Specific Gravity, UA 1.015 1.005 - 1.030   pH, UA 5.5 5.0 - 7.5   Color, UA Yellow Yellow   Appearance Ur Clear Clear   Leukocytes,UA Trace (A) Negative   Protein,UA Negative Negative/Trace   Glucose, UA Negative Negative   Ketones, UA Negative Negative   RBC, UA Negative Negative   Bilirubin, UA Negative Negative   Urobilinogen, Ur 0.2 0.2 - 1.0 mg/dL   Nitrite, UA Negative Negative   Microscopic Examination See below:       Assessment & Plan:   Problem List Items Addressed This Visit       Cardiovascular and Mediastinum   Primary hypertension   Relevant Medications   valsartan (DIOVAN) 40 MG tablet     Nervous and Auditory   Familial tremor    Chronic. Ongoing problem.  Seeing Dr. Melrose Nakayama for Neurology.  Continue to follow their recommendations.  Follow up in 6 months. Call sooner if concerns arise.         Other   Anxiety    Chronic.  Controlled.  Continue with current medication regimen of Lexapro and Buspar.  Was put on Klonopin by Neurology.  Return to clinic in 6 months for reevaluation.  Call sooner if concerns arise.        Urge urinary incontinence    Chronic. Ongoing problem.  States she has had a prolapsed bladder in the past.  Will refer back to Urology for further evaluation.       Relevant Orders   Ambulatory referral to Urology   Depression, major, recurrent, mild ([place])    Chronic.  Ongoing.  Continue with current medication regimen of Lexapro and Buspar.  Return to clinic in 6 months for reevaluation.  Call sooner if concerns arise.       Advanced care planning/counseling discussion   Other Visit Diagnoses     Annual physical exam    -  Primary   Health maintenance reviewed during visit today.  Labs ordered. Up to date on PAP,  Mammogram and Colonscopy.  Flu given today.    Relevant Orders   CBC with Differential/Platelet   Comprehensive metabolic panel   Lipid panel   TSH   Urinalysis, Routine w reflex microscopic (Completed)   Welcome to Medicare preventive visit       Relevant Orders   EKG 12-Lead (Completed)   Need for shingles vaccine       Relevant Orders   Zoster Recombinant (Shingrix ) (Completed)   Need for influenza vaccination       Relevant Orders   Flu Vaccine QUAD 6+ mos PF IM (Fluarix Quad PF) (Completed)   Screening for ischemic heart disease       Relevant Orders   Lipid panel        Preventative Services:  AAA screening:  Health Risk Assessment and Personalized Prevention Plan: Up to date Bone Mass Measurements: Up to date Breast Cancer Screening: Up to date CVD Screening: Up to date Cervical Cancer Screening: Up to date Colon Cancer Screening: Up to date Depression Screening: Up to date Diabetes Screening: Up to date Glaucoma Screening: Needs to be done Hepatitis B vaccine: NA Hepatitis C  screening: Up to date HIV Screening: Up to date Flu Vaccine: Given today Lung cancer Screening: NA Obesity Screening: Up to date Pneumonia Vaccines (2): NA STI Screening: NA  Follow up plan: Return in about 6 months (around 04/27/2022) for HTN, HLD, DM2 FU.   LABORATORY TESTING:  - Pap smear: up to date  IMMUNIZATIONS:   - Tdap: Tetanus vaccination status reviewed: last tetanus booster within 10 years. - Influenza: Up to date - Pneumovax: Not applicable - Prevnar: Not applicable - Zostavax vaccine: Up to date  SCREENING: -Mammogram: Up to date  - Colonoscopy: Up to date  - Bone Density: Not applicable  -Hearing Test: Not applicable  -Spirometry: Not applicable   PATIENT COUNSELING:   Advised to take 1 mg of folate supplement per day if capable of pregnancy.   Sexuality: Discussed sexually transmitted diseases, partner selection, use of condoms, avoidance of unintended  pregnancy  and contraceptive alternatives.   Advised to avoid cigarette smoking.  I discussed with the patient that most people either abstain from alcohol or drink within safe limits (<=14/week and <=4 drinks/occasion for males, <=7/weeks and <= 3 drinks/occasion for females) and that the risk for alcohol disorders and other health effects rises proportionally with the number of drinks per week and how often a drinker exceeds daily limits.  Discussed cessation/primary prevention of drug use and availability of treatment for abuse.   Diet: Encouraged to adjust caloric intake to maintain  or achieve ideal body weight, to reduce intake of dietary saturated fat and total fat, to limit sodium intake by avoiding high sodium foods and not adding table salt, and to maintain adequate dietary potassium and calcium preferably from fresh fruits, vegetables, and low-fat dairy products.    stressed the importance of regular exercise  Injury prevention: Discussed safety belts, safety helmets, smoke detector, smoking near bedding or upholstery.   Dental health: Discussed importance of regular tooth brushing, flossing, and dental visits.    NEXT PREVENTATIVE PHYSICAL DUE IN 1 YEAR. Return in about 6 months (around 04/27/2022) for HTN, HLD, DM2 FU.

## 2021-10-26 NOTE — Assessment & Plan Note (Signed)
Chronic. Ongoing problem.  States she has had a prolapsed bladder in the past.  Will refer back to Urology for further evaluation.

## 2021-10-26 NOTE — Assessment & Plan Note (Signed)
Chronic.  Ongoing.  Continue with current medication regimen of Lexapro and Buspar.  Return to clinic in 6 months for reevaluation.  Call sooner if concerns arise.

## 2021-10-26 NOTE — Assessment & Plan Note (Signed)
Chronic.  Controlled.  Continue with current medication regimen of Lexapro and Buspar.  Was put on Klonopin by Neurology.  Return to clinic in 6 months for reevaluation.  Call sooner if concerns arise.

## 2021-10-26 NOTE — Assessment & Plan Note (Signed)
Chronic. Ongoing problem.  Seeing Dr. Melrose Nakayama for Neurology.  Continue to follow their recommendations.  Follow up in 6 months. Call sooner if concerns arise.

## 2021-10-26 NOTE — Progress Notes (Signed)
Results discussed with patient during visit.

## 2021-10-27 LAB — LIPID PANEL
Chol/HDL Ratio: 3.4 ratio (ref 0.0–4.4)
Cholesterol, Total: 199 mg/dL (ref 100–199)
HDL: 59 mg/dL (ref >39)
LDL Chol Calc (NIH): 124 mg/dL — ABNORMAL HIGH (ref 0–99)
Triglycerides: 90 mg/dL (ref 0–149)
VLDL Cholesterol Cal: 16 mg/dL (ref 5–40)

## 2021-10-27 LAB — TSH: TSH: 1.24 u[IU]/mL (ref 0.450–4.500)

## 2021-10-27 LAB — CBC WITH DIFFERENTIAL/PLATELET
Basophils Absolute: 0 10*3/uL (ref 0.0–0.2)
Basos: 1 %
EOS (ABSOLUTE): 0 10*3/uL (ref 0.0–0.4)
Eos: 1 %
Hematocrit: 38.7 % (ref 34.0–46.6)
Hemoglobin: 13 g/dL (ref 11.1–15.9)
Immature Grans (Abs): 0 10*3/uL (ref 0.0–0.1)
Immature Granulocytes: 0 %
Lymphocytes Absolute: 1.3 10*3/uL (ref 0.7–3.1)
Lymphs: 58 %
MCH: 31.2 pg (ref 26.6–33.0)
MCHC: 33.6 g/dL (ref 31.5–35.7)
MCV: 93 fL (ref 79–97)
Monocytes Absolute: 0.4 10*3/uL (ref 0.1–0.9)
Monocytes: 18 %
Neutrophils Absolute: 0.5 10*3/uL — ABNORMAL LOW (ref 1.4–7.0)
Neutrophils: 22 %
Platelets: 196 10*3/uL (ref 150–450)
RBC: 4.17 x10E6/uL (ref 3.77–5.28)
RDW: 12.5 % (ref 11.7–15.4)
WBC: 2.2 10*3/uL — CL (ref 3.4–10.8)

## 2021-10-27 LAB — COMPREHENSIVE METABOLIC PANEL
ALT: 11 IU/L (ref 0–32)
AST: 18 IU/L (ref 0–40)
Albumin/Globulin Ratio: 1.8 (ref 1.2–2.2)
Albumin: 4.4 g/dL (ref 3.8–4.9)
Alkaline Phosphatase: 90 IU/L (ref 44–121)
BUN/Creatinine Ratio: 20 (ref 9–23)
BUN: 13 mg/dL (ref 6–24)
Bilirubin Total: 0.4 mg/dL (ref 0.0–1.2)
CO2: 22 mmol/L (ref 20–29)
Calcium: 9.1 mg/dL (ref 8.7–10.2)
Chloride: 103 mmol/L (ref 96–106)
Creatinine, Ser: 0.65 mg/dL (ref 0.57–1.00)
Globulin, Total: 2.4 g/dL (ref 1.5–4.5)
Glucose: 81 mg/dL (ref 70–99)
Potassium: 4.2 mmol/L (ref 3.5–5.2)
Sodium: 142 mmol/L (ref 134–144)
Total Protein: 6.8 g/dL (ref 6.0–8.5)
eGFR: 103 mL/min/{1.73_m2} (ref >59)

## 2021-10-27 NOTE — Progress Notes (Signed)
Please let patient know that her lab work shows that her White blood cell count is low.  I would like her to come back on Friday and have this rechecked to make sure it returns to normal.    The 10-year ASCVD risk score (Arnett DK, et al., 2019) is: 3.3% Her cholesterol is slightly elevated from prior.  I recommend a low fat diet and exercise.  No other concerns at this time.  Follow up as discussed.

## 2021-10-29 DIAGNOSIS — F419 Anxiety disorder, unspecified: Secondary | ICD-10-CM | POA: Diagnosis not present

## 2021-10-29 DIAGNOSIS — R251 Tremor, unspecified: Secondary | ICD-10-CM | POA: Diagnosis not present

## 2021-10-29 DIAGNOSIS — Z8659 Personal history of other mental and behavioral disorders: Secondary | ICD-10-CM | POA: Diagnosis not present

## 2021-10-29 DIAGNOSIS — G479 Sleep disorder, unspecified: Secondary | ICD-10-CM | POA: Diagnosis not present

## 2021-10-30 ENCOUNTER — Other Ambulatory Visit: Payer: PPO

## 2021-10-30 DIAGNOSIS — D72829 Elevated white blood cell count, unspecified: Secondary | ICD-10-CM | POA: Diagnosis not present

## 2021-10-30 NOTE — Progress Notes (Unsigned)
Orders placed.

## 2021-10-31 ENCOUNTER — Telehealth: Payer: Self-pay | Admitting: Nurse Practitioner

## 2021-10-31 LAB — CBC WITH DIFFERENTIAL/PLATELET
Basophils Absolute: 0 10*3/uL (ref 0.0–0.2)
Basos: 1 %
EOS (ABSOLUTE): 0 10*3/uL (ref 0.0–0.4)
Eos: 2 %
Hematocrit: 40.1 % (ref 34.0–46.6)
Hemoglobin: 13.2 g/dL (ref 11.1–15.9)
Immature Grans (Abs): 0 10*3/uL (ref 0.0–0.1)
Immature Granulocytes: 1 %
Lymphocytes Absolute: 1.2 10*3/uL (ref 0.7–3.1)
Lymphs: 57 %
MCH: 30.6 pg (ref 26.6–33.0)
MCHC: 32.9 g/dL (ref 31.5–35.7)
MCV: 93 fL (ref 79–97)
Monocytes Absolute: 0.4 10*3/uL (ref 0.1–0.9)
Monocytes: 18 %
Neutrophils Absolute: 0.4 10*3/uL — CL (ref 1.4–7.0)
Neutrophils: 21 %
Platelets: 203 10*3/uL (ref 150–450)
RBC: 4.32 x10E6/uL (ref 3.77–5.28)
RDW: 12.3 % (ref 11.7–15.4)
WBC: 2 10*3/uL — CL (ref 3.4–10.8)

## 2021-10-31 NOTE — Telephone Encounter (Signed)
Received call from nurse call team at [phone] on 10/31/21 in regard to abnormal lab results 10/30/21 for this patient with WBC 2.0 and neutrophils 0.4, mild trend down from previous on review 10/26/21 of WBC 2.2 and neutrophils 0.5.  Recent notes reviewed from PCP and neurology, afebrile and maintaining weight.  Will leave for PCP review Monday to determine next steps for patient.  Will alert them to lab value and call via this note.

## 2021-11-02 NOTE — Telephone Encounter (Signed)
Please see result note 

## 2021-11-02 NOTE — Progress Notes (Signed)
Please let patient know that her white blood cell count is still low.  We will recheck it in 1 month to see if it improves.

## 2021-11-30 ENCOUNTER — Ambulatory Visit (INDEPENDENT_AMBULATORY_CARE_PROVIDER_SITE_OTHER): Payer: PPO | Admitting: Urology

## 2021-11-30 VITALS — BP 139/91 | HR 67 | Ht 64.0 in | Wt 182.0 lb

## 2021-11-30 DIAGNOSIS — R32 Unspecified urinary incontinence: Secondary | ICD-10-CM | POA: Diagnosis not present

## 2021-11-30 DIAGNOSIS — N3946 Mixed incontinence: Secondary | ICD-10-CM

## 2021-11-30 LAB — URINALYSIS
Bilirubin, UA: NEGATIVE
Glucose, UA: NEGATIVE
Ketones, UA: NEGATIVE
Nitrite, UA: NEGATIVE
Protein,UA: NEGATIVE
RBC, UA: NEGATIVE
Specific Gravity, UA: 1.005 (ref 1.005–1.030)
Urobilinogen, Ur: 0.2 mg/dL (ref 0.2–1.0)
pH, UA: 5.5 (ref 5.0–7.5)

## 2021-11-30 NOTE — Progress Notes (Signed)
11/30/2021 10:41 AM   Cynthia Sellers [birth year] [id number]  Referring provider: Jon Billings, NP [address] [place],  [place] [address]  No chief complaint on file.   HPI: I was consulted to assist the patient's urinary incontinence.  She has urge incontinence.  She leaks with coughing sneezing but not bending and lifting.  She has high-volume bedwetting.  She wears 2 pads a day moderately wet  She voids every hour depending on fluid intake and cannot hold it for 2 hours.  She gets up 4-5 times at night.  Flow is reasonable  She had a hysterectomy and bladder suspension years ago  She has a tremor but I could not identify the underlying cause and I think is going to see neurology  She tends toward constipation.  No treatment.  No kidney stones or bladder infection   PMH: Past Medical History:  Diagnosis Date   Anxiety    Complication of anesthesia    Depression    GERD (gastroesophageal reflux disease)    Headache, tension type, episodic    Excedrin tension headache helps   Hypertension    Migraine    H/O MIGRAINES   PONV (postoperative nausea and vomiting)    WITH HYSTERECTOMY   Suicide attempt ([place]) 1992   Took overdose of sleeping pills   Tremor of both hands     Surgical History: Past Surgical History:  Procedure Laterality Date   BILATERAL SALPINGOOPHORECTOMY  01/04/2010   COLONOSCOPY WITH PROPOFOL N/A 11/10/2020   Procedure: COLONOSCOPY WITH PROPOFOL;  Surgeon: Lin Landsman, MD;  Location: [facility];  Service: Gastroenterology;  Laterality: N/A;   ESOPHAGOGASTRODUODENOSCOPY N/A 11/10/2020   Procedure: ESOPHAGOGASTRODUODENOSCOPY (EGD);  Surgeon: Lin Landsman, MD;  Location: [facility] ENDOSCOPY;  Service: Gastroenterology;  Laterality: N/A;   HEEL SPUR EXCISION Left 01/05/2007   LIPOMA EXCISION Left 07/13/2017   Procedure: EXCISION LIPOMA-LEFT BREAST;  Surgeon: Robert Bellow, MD;  Location: [facility];  Service: General;   Laterality: Left;   SUBMANDIBULAR GLAND EXCISION Right 06/10/2021   Procedure: EXCISION SUBMANDIBULAR GLAND;  Surgeon: Clyde Canterbury, MD;  Location: [facility];  Service: ENT;  Laterality: Right;   TOTAL ABDOMINAL HYSTERECTOMY W/ BILATERAL SALPINGOOPHORECTOMY  01/04/2010    Home Medications:  Allergies as of 11/30/2021       Reactions   Vicodin [hydrocodone-acetaminophen] Nausea And Vomiting        Medication List        Accurate as of November 30, 2021 10:41 AM. If you have any questions, ask your nurse or doctor.          busPIRone 5 MG tablet Commonly known as: BUSPAR Take 1 tablet (5 mg total) by mouth 2 (two) times daily.   clonazePAM 0.5 MG tablet Commonly known as: KLONOPIN Take 0.25 mg twice daily for one week. Then, increase to 0.25 mg twice daily, continue with this dose.   escitalopram 10 MG tablet Commonly known as: Lexapro Take 1 tablet (10 mg total) by mouth daily. What changed: when to take this   omeprazole 20 MG capsule Commonly known as: PRILOSEC Take 1 capsule (20 mg total) by mouth 2 (two) times daily before a meal. Take 1 cap by mouth twice daily   oxyCODONE-acetaminophen 5-325 MG tablet Commonly known as: Percocet Take 1 tablet by mouth every 4 (four) hours as needed for severe pain.   potassium chloride 10 MEQ tablet Commonly known as: KLOR-CON M Take 1 tablet (10 mEq total) by mouth 2 (two)  times daily.   QUEtiapine 25 MG tablet Commonly known as: SEROQUEL Take 1 tablet (25 mg total) by mouth at bedtime.   traMADol-acetaminophen 37.5-325 MG tablet Commonly known as: Ultracet Take 1-2 tablets by mouth every 6 (six) hours as needed.   valsartan 40 MG tablet Commonly known as: DIOVAN Take 1 tablet (40 mg total) by mouth daily.        Allergies:  Allergies  Allergen Reactions   Vicodin [Hydrocodone-Acetaminophen] Nausea And Vomiting    Family History: Family History  Problem Relation Age of Onset   Heart disease Father 72    Tremor Father    Arthritis Paternal Grandmother    Heart disease Paternal Grandmother    Hypertension Paternal Grandmother    Heart disease Paternal Grandfather    Stroke Paternal Grandfather    Hypertension Paternal Grandfather    Cancer Brother        throat and brain cancer   Heart disease Brother        deceased from MI   Tremor Brother    Breast cancer Neg Hx     Social History:  reports that she has never smoked. She has never used smokeless tobacco. She reports that she does not drink alcohol and does not use drugs.  ROS:                                        Physical Exam: There were no vitals taken for this visit.  Constitutional:  Alert and oriented, No acute distress. HEENT: [place] AT, moist mucus membranes.  Trachea midline, no masses. Cardiovascular: No clubbing, cyanosis, or edema. Respiratory: Normal respiratory effort, no increased work of breathing. GI: Abdomen is soft, nontender, nondistended, no abdominal masses GU: Well supported bladder neck with a negative cough test and no significant prolapse Skin: No rashes, bruises or suspicious lesions. Lymph: No cervical or inguinal adenopathy. Neurologic: Grossly intact, no focal deficits, moving all 4 extremities. Psychiatric: Normal mood and affect.  Laboratory Data: Lab Results  Component Value Date   WBC 2.0 (LL) 10/30/2021   HGB 13.2 10/30/2021   HCT 40.1 10/30/2021   MCV 93 10/30/2021   PLT 203 10/30/2021    Lab Results  Component Value Date   CREATININE 0.65 10/26/2021    No results found for: "PSA"  No results found for: "TESTOSTERONE"  No results found for: "HGBA1C"  Urinalysis    Component Value Date/Time   COLORURINE Amber 01/31/2012 1838   APPEARANCEUR Clear 10/26/2021 1122   LABSPEC 1.023 01/31/2012 1838   PHURINE 5.0 01/31/2012 1838   GLUCOSEU Negative 10/26/2021 1122   GLUCOSEU Negative 01/31/2012 1838   HGBUR 1+ 01/31/2012 1838   BILIRUBINUR Negative  10/26/2021 1122   BILIRUBINUR Negative 01/31/2012 1838   KETONESUR Trace 01/31/2012 1838   PROTEINUR Negative 10/26/2021 1122   PROTEINUR 100 mg/dL 01/31/2012 1838   NITRITE Negative 10/26/2021 1122   NITRITE Positive 01/31/2012 1838   LEUKOCYTESUR Trace (A) 10/26/2021 1122   LEUKOCYTESUR 3+ 01/31/2012 1838    Pertinent Imaging: Urine reviewed.  Urine sent for culture.  Chart reviewed  Assessment & Plan: Patient has mixed incontinence.  She has high-volume bedwetting.  She has significant frequency and nocturia.  Patient reports stress and urge components are both significant but likely primarily has an overactive bladder.  She will return for urodynamics and cystoscopy.  Call if urine culture is positive  There  are no diagnoses linked to this encounter.  No follow-ups on file.  Reece Packer, MD  [facility] [address], [place], [place] [address] [phone]

## 2021-11-30 NOTE — Addendum Note (Signed)
Addended by: Evelina Bucy on: 11/30/2021 04:38 PM   Modules accepted: Orders

## 2021-12-01 ENCOUNTER — Other Ambulatory Visit: Payer: PPO

## 2021-12-01 DIAGNOSIS — D72829 Elevated white blood cell count, unspecified: Secondary | ICD-10-CM

## 2021-12-01 LAB — URINALYSIS, COMPLETE
Bilirubin, UA: NEGATIVE
Glucose, UA: NEGATIVE
Ketones, UA: NEGATIVE
Nitrite, UA: NEGATIVE
Protein,UA: NEGATIVE
RBC, UA: NEGATIVE
Specific Gravity, UA: <1.005 — ABNORMAL LOW (ref 1.005–1.030)
Urobilinogen, Ur: 0.2 mg/dL (ref 0.2–1.0)
pH, UA: 5.5 (ref 5.0–7.5)

## 2021-12-01 LAB — MICROSCOPIC EXAMINATION

## 2021-12-02 LAB — CBC WITH DIFFERENTIAL/PLATELET
Basophils Absolute: 0 10*3/uL (ref 0.0–0.2)
Basos: 1 %
EOS (ABSOLUTE): 0.1 10*3/uL (ref 0.0–0.4)
Eos: 2 %
Hematocrit: 40 % (ref 34.0–46.6)
Hemoglobin: 13.5 g/dL (ref 11.1–15.9)
Immature Grans (Abs): 0 10*3/uL (ref 0.0–0.1)
Immature Granulocytes: 1 %
Lymphocytes Absolute: 1.7 10*3/uL (ref 0.7–3.1)
Lymphs: 40 %
MCH: 30.9 pg (ref 26.6–33.0)
MCHC: 33.8 g/dL (ref 31.5–35.7)
MCV: 92 fL (ref 79–97)
Monocytes Absolute: 0.5 10*3/uL (ref 0.1–0.9)
Monocytes: 11 %
Neutrophils Absolute: 1.9 10*3/uL (ref 1.4–7.0)
Neutrophils: 45 %
Platelets: 248 10*3/uL (ref 150–450)
RBC: 4.37 x10E6/uL (ref 3.77–5.28)
RDW: 12 % (ref 11.7–15.4)
WBC: 4.2 10*3/uL (ref 3.4–10.8)

## 2021-12-02 LAB — CULTURE, URINE COMPREHENSIVE

## 2021-12-02 NOTE — Progress Notes (Signed)
Please let patient know that her lab work came back normal.  We will continue to monitor at future visits.

## 2022-01-18 ENCOUNTER — Other Ambulatory Visit: Payer: PPO | Admitting: Urology

## 2022-01-20 DIAGNOSIS — Z8659 Personal history of other mental and behavioral disorders: Secondary | ICD-10-CM | POA: Diagnosis not present

## 2022-01-20 DIAGNOSIS — G479 Sleep disorder, unspecified: Secondary | ICD-10-CM | POA: Diagnosis not present

## 2022-01-20 DIAGNOSIS — R251 Tremor, unspecified: Secondary | ICD-10-CM | POA: Diagnosis not present

## 2022-01-20 DIAGNOSIS — F419 Anxiety disorder, unspecified: Secondary | ICD-10-CM | POA: Diagnosis not present

## 2022-01-27 ENCOUNTER — Ambulatory Visit: Payer: Self-pay | Admitting: *Deleted

## 2022-01-27 NOTE — Telephone Encounter (Signed)
  Chief Complaint: chronic diarrhea Symptoms: patient reports diarrhea every time she eats- chronic long term issue Frequency: months/years Pertinent Negatives: Patient denied dehydration Disposition: '[]'$ ED /'[]'$ Urgent Care (no appt availability in office) / '[x]'$ Appointment(In office/virtual)/ '[]'$  [facility]/ '[]'$ Home Care/ '[]'$ Refused Recommended Disposition /'[]'$ [facility]/ '[]'$  Follow-up with PCP Additional Notes: Patient advised per protocol- will discuss referral at appointment . Patient states she has never discussed this problem with provider.

## 2022-01-27 NOTE — Telephone Encounter (Signed)
Summary: Uncontrolled Diarrhea   Patient would like a referral to a gastrologist. Patient is having uncontrollable diarrhea daily.         Reason for Disposition  Diarrhea is a chronic symptom (recurrent or ongoing AND present > 4 weeks)  Answer Assessment - Initial Assessment Questions 1. DIARRHEA SEVERITY: "How bad is the diarrhea?" "How many more stools have you had in the past 24 hours than normal?"    - NO DIARRHEA (SCALE 0)   - MILD (SCALE 1-3): Few loose or mushy BMs; increase of 1-3 stools over normal daily number of stools; mild increase in ostomy output.   -  MODERATE (SCALE 4-7): Increase of 4-6 stools daily over normal; moderate increase in ostomy output.   -  SEVERE (SCALE 8-10; OR "WORST POSSIBLE"): Increase of 7 or more stools daily over normal; moderate increase in ostomy output; incontinence.     Patient has chronic diarrhea- after eating goes right through. Normal- 2/day- now patient has at least 3/day- every time she eat 2. ONSET: "When did the diarrhea begin?"      Chronic problem-  3. BM CONSISTENCY: "How loose or watery is the diarrhea?"      watery 4. VOMITING: "Are you also vomiting?" If Yes, ask: "How many times in the past 24 hours?"      no 5. ABDOMEN PAIN: "Are you having any abdomen pain?" If Yes, ask: "What does it feel like?" (e.g., crampy, dull, intermittent, constant)      Pain with laying down- stomach pain- most of the time 6. ABDOMEN PAIN SEVERITY: If present, ask: "How bad is the pain?"  (e.g., Scale 1-10; mild, moderate, or severe)   - MILD (1-3): doesn't interfere with normal activities, abdomen soft and not tender to touch    - MODERATE (4-7): interferes with normal activities or awakens from sleep, abdomen tender to touch    - SEVERE (8-10): excruciating pain, doubled over, unable to do any normal activities       moderate 7. ORAL INTAKE: If vomiting, "Have you been able to drink liquids?" "How much liquids have you had in the past 24 hours?"      na 8. HYDRATION: "Any signs of dehydration?" (e.g., dry mouth [not just dry lips], too weak to stand, dizziness, new weight loss) "When did you last urinate?"     No- does have dry mouth at night, just a few minutes ago 9. EXPOSURE: "Have you traveled to a foreign country recently?" "Have you been exposed to anyone with diarrhea?" "Could you have eaten any food that was spoiled?"     no 10. ANTIBIOTIC USE: "Are you taking antibiotics now or have you taken antibiotics in the past 2 months?"       no 11. OTHER SYMPTOMS: "Do you have any other symptoms?" (e.g., fever, blood in stool)       no  Protocols used: Diarrhea-A-AH

## 2022-02-09 ENCOUNTER — Encounter: Payer: Self-pay | Admitting: Nurse Practitioner

## 2022-02-09 ENCOUNTER — Ambulatory Visit (INDEPENDENT_AMBULATORY_CARE_PROVIDER_SITE_OTHER): Payer: PPO | Admitting: Nurse Practitioner

## 2022-02-09 VITALS — BP 150/97 | HR 69 | Temp 98.1°F | Wt 189.5 lb

## 2022-02-09 DIAGNOSIS — M79602 Pain in left arm: Secondary | ICD-10-CM

## 2022-02-09 DIAGNOSIS — R197 Diarrhea, unspecified: Secondary | ICD-10-CM | POA: Diagnosis not present

## 2022-02-09 MED ORDER — QUETIAPINE FUMARATE 25 MG PO TABS: 25.0000 mg | ORAL_TABLET | Freq: Every day | ORAL | 1 refills | Status: DC

## 2022-02-09 MED ORDER — BUSPIRONE HCL 5 MG PO TABS: 5.0000 mg | ORAL_TABLET | Freq: Two times a day (BID) | ORAL | 1 refills | Status: DC

## 2022-02-09 MED ORDER — VALSARTAN 40 MG PO TABS: 40.0000 mg | ORAL_TABLET | Freq: Every day | ORAL | 1 refills | Status: DC

## 2022-02-09 MED ORDER — ESCITALOPRAM OXALATE 10 MG PO TABS: 10.0000 mg | ORAL_TABLET | Freq: Every day | ORAL | 1 refills | Status: DC

## 2022-02-09 MED ORDER — OMEPRAZOLE 20 MG PO CPDR: 20.0000 mg | DELAYED_RELEASE_CAPSULE | Freq: Two times a day (BID) | ORAL | 1 refills | Status: DC

## 2022-02-09 MED ORDER — METHYLPREDNISOLONE 4 MG PO TBPK: ORAL_TABLET | ORAL | 0 refills | Status: DC

## 2022-02-09 NOTE — Progress Notes (Signed)
BP (!) 150/97   Pulse 69   Temp 98.1 F (36.7 C) (Oral)   Wt 189 lb 8 oz (86 kg)   SpO2 98%   BMI 32.53 kg/m    Subjective:    Patient ID: Cynthia Sellers, female    DOB: [birth year], 57 y.o.   MRN: [id number]  HPI: Cynthia Sellers is a 57 y.o. female  Chief Complaint  Patient presents with   Diarrhea    Pt states she has been having diarrhea every time she eats for a while   Arm Pain    Pt states she has been having L arm pain for the last 2 to 3 weeks. States the pain is constant.    ABDOMINAL ISSUES Duration: months Nature: bloating, sharp, and cramping Location: LLQ and RLQ  Severity: 9/10  Radiation: no Episode duration: "a long time" Frequency: intermittent Alleviating factors: going to the bathroom Aggravating factors: eating greasy food Treatments attempted:  pepto bismal, immodium Constipation: no Diarrhea: yes Episodes of diarrhea/day: Mucous in the stool: no Heartburn: yes Bloating:yes Flatulence: yes Nausea: yes Vomiting:  sometimes Episodes of vomit/day: Melena or hematochezia: no Rash: no Jaundice: no Fever: no Weight loss: no  ARM PAIN Duration: weeks Location: left Mechanism of injury: unknown Onset: sudden Severity: moderate  Quality:  sharp Frequency: constant Radiation: no Aggravating factors: movement  Alleviating factors: rest  Status: stable Treatments attempted: rest  Relief with NSAIDs?:  no Swelling: no Redness: no  Warmth: no Trauma: no Chest pain: no  Shortness of breath: no  Fever: no Decreased sensation: no Paresthesias: no Weakness: no  Relevant past medical, surgical, family and social history reviewed and updated as indicated. Interim medical history since our last visit reviewed. Allergies and medications reviewed and updated.  Review of Systems  Gastrointestinal:  Positive for abdominal pain, diarrhea, nausea and vomiting. Negative for constipation.  Musculoskeletal:        Left arm pain     Per HPI unless specifically indicated above     Objective:    BP (!) 150/97   Pulse 69   Temp 98.1 F (36.7 C) (Oral)   Wt 189 lb 8 oz (86 kg)   SpO2 98%   BMI 32.53 kg/m   Wt Readings from Last 3 Encounters:  02/09/22 189 lb 8 oz (86 kg)  11/30/21 182 lb (82.6 kg)  10/26/21 178 lb 1.6 oz (80.8 kg)    Physical Exam Vitals and nursing note reviewed.  Constitutional:      General: She is not in acute distress.    Appearance: Normal appearance. She is normal weight. She is not ill-appearing, toxic-appearing or diaphoretic.  HENT:     Head: Normocephalic.     Right Ear: External ear normal.     Left Ear: External ear normal.     Nose: Nose normal.     Mouth/Throat:     Mouth: Mucous membranes are moist.     Pharynx: Oropharynx is clear.  Eyes:     General:        Right eye: No discharge.        Left eye: No discharge.     Extraocular Movements: Extraocular movements intact.     Conjunctiva/sclera: Conjunctivae normal.     Pupils: Pupils are equal, round, and reactive to light.  Cardiovascular:     Rate and Rhythm: Normal rate and regular rhythm.     Heart sounds: No murmur heard. Pulmonary:     Effort: Pulmonary effort  is normal. No respiratory distress.     Breath sounds: Normal breath sounds. No wheezing or rales.  Abdominal:     General: Abdomen is flat. Bowel sounds are normal. There is no distension.     Palpations: Abdomen is soft. There is no mass.     Tenderness: There is no abdominal tenderness. There is no right CVA tenderness, left CVA tenderness, guarding or rebound.     Hernia: No hernia is present.  Musculoskeletal:     Cervical back: Normal range of motion and neck supple.  Skin:    General: Skin is warm and dry.     Capillary Refill: Capillary refill takes less than 2 seconds.  Neurological:     General: No focal deficit present.     Mental Status: She is alert and oriented to person, place, and time. Mental status is at baseline.   Psychiatric:        Mood and Affect: Mood normal.        Behavior: Behavior normal.        Thought Content: Thought content normal.        Judgment: Judgment normal.     Results for orders placed or performed in visit on 12/01/21  CBC w/Diff  Result Value Ref Range   WBC 4.2 3.4 - 10.8 x10E3/uL   RBC 4.37 3.77 - 5.28 x10E6/uL   Hemoglobin 13.5 11.1 - 15.9 g/dL   Hematocrit 40.0 34.0 - 46.6 %   MCV 92 79 - 97 fL   MCH 30.9 26.6 - 33.0 pg   MCHC 33.8 31.5 - 35.7 g/dL   RDW 12.0 11.7 - 15.4 %   Platelets 248 150 - 450 x10E3/uL   Neutrophils 45 Not Estab. %   Lymphs 40 Not Estab. %   Monocytes 11 Not Estab. %   Eos 2 Not Estab. %   Basos 1 Not Estab. %   Neutrophils Absolute 1.9 1.4 - 7.0 x10E3/uL   Lymphocytes Absolute 1.7 0.7 - 3.1 x10E3/uL   Monocytes Absolute 0.5 0.1 - 0.9 x10E3/uL   EOS (ABSOLUTE) 0.1 0.0 - 0.4 x10E3/uL   Basophils Absolute 0.0 0.0 - 0.2 x10E3/uL   Immature Granulocytes 1 Not Estab. %   Immature Grans (Abs) 0.0 0.0 - 0.1 x10E3/uL      Assessment & Plan:   Problem List Items Addressed This Visit   None Visit Diagnoses     Diarrhea, unspecified type    -  Primary   Discussed diet modifications with patient during visit.  Discussed avoiding greasy foods. Will place referral for GI.   Relevant Orders   Ambulatory referral to Gastroenterology   Left arm pain       Will treat with medrol dose pak. If not improved can consider muscle relaxer.        Follow up plan: Return in about 1 month (around 03/10/2022) for Diarrhea.

## 2022-02-22 ENCOUNTER — Encounter: Payer: Self-pay | Admitting: Urology

## 2022-02-22 ENCOUNTER — Ambulatory Visit (INDEPENDENT_AMBULATORY_CARE_PROVIDER_SITE_OTHER): Payer: PPO | Admitting: Urology

## 2022-02-22 VITALS — BP 121/85 | HR 96 | Ht 64.0 in | Wt 192.8 lb

## 2022-02-22 DIAGNOSIS — R32 Unspecified urinary incontinence: Secondary | ICD-10-CM

## 2022-02-22 LAB — URINALYSIS, COMPLETE
Bilirubin, UA: NEGATIVE
Glucose, UA: NEGATIVE
Ketones, UA: NEGATIVE
Nitrite, UA: NEGATIVE
Protein,UA: NEGATIVE
RBC, UA: NEGATIVE
Specific Gravity, UA: 1.015 (ref 1.005–1.030)
Urobilinogen, Ur: 0.2 mg/dL (ref 0.2–1.0)
pH, UA: 7 (ref 5.0–7.5)

## 2022-02-22 LAB — MICROSCOPIC EXAMINATION

## 2022-02-22 NOTE — Progress Notes (Signed)
02/22/2022 2:00 PM   STEPHANIA SLEPPY April 27, 1965 [id number]  Referring provider: Jon Billings, NP [address] [place],  [place] [address]  Chief Complaint  Patient presents with   Cysto    HPI: I was consulted to assist the patient's urinary incontinence.  She has urge incontinence.  She leaks with coughing sneezing but not bending and lifting.  She has high-volume bedwetting.  She wears 2 pads a day moderately wet   She voids every hour depending on fluid intake and cannot hold it for 2 hours.  She gets up 4-5 times at night.  Flow is reasonable   She had a hysterectomy and bladder suspension years ago   She has a tremor but I could not identify the underlying cause and I think is going to see neurology    Well supported bladder neck with a negative cough test and no significant prolapse   Patient has mixed incontinence. She has high-volume bedwetting. She has significant frequency and nocturia. Patient reports stress and urge components are both significant but likely primarily has an overactive bladder. She will return for urodynamics and cystoscopy.   Today Frequency stable.  Incontinence stable.  Last culture negative Patient never did the urodynamics since she was not called.  On pelvic examination she had a negative cough test with a strong cough after cystoscopy  Neurologist does not think she has parkinsonism but her on medicine.  Cystoscopy: Patient underwent flexible cystoscopy.  Bladder mucosa and trigone were normal.  No cystitis.  No carcinoma.  Very well-tolerated   PMH: Past Medical History:  Diagnosis Date   Anxiety    Complication of anesthesia    Depression    GERD (gastroesophageal reflux disease)    Headache, tension type, episodic    Excedrin tension headache helps   Hypertension    Migraine    H/O MIGRAINES   PONV (postoperative nausea and vomiting)    WITH HYSTERECTOMY   Suicide attempt ([place]) 1992   Took overdose of sleeping pills    Tremor of both hands     Surgical History: Past Surgical History:  Procedure Laterality Date   BILATERAL SALPINGOOPHORECTOMY  01/04/2010   COLONOSCOPY WITH PROPOFOL N/A 11/10/2020   Procedure: COLONOSCOPY WITH PROPOFOL;  Surgeon: Lin Landsman, MD;  Location: [facility];  Service: Gastroenterology;  Laterality: N/A;   ESOPHAGOGASTRODUODENOSCOPY N/A 11/10/2020   Procedure: ESOPHAGOGASTRODUODENOSCOPY (EGD);  Surgeon: Lin Landsman, MD;  Location: [facility]COPY;  Service: Gastroenterology;  Laterality: N/A;   HEEL SPUR EXCISION Left 01/05/2007   LIPOMA EXCISION Left 07/13/2017   Procedure: EXCISION LIPOMA-LEFT BREAST;  Surgeon: Robert Bellow, MD;  Location: [facility];  Service: General;  Laterality: Left;   SUBMANDIBULAR GLAND EXCISION Right 06/10/2021   Procedure: EXCISION SUBMANDIBULAR GLAND;  Surgeon: Clyde Canterbury, MD;  Location: [facility];  Service: ENT;  Laterality: Right;   TOTAL ABDOMINAL HYSTERECTOMY W/ BILATERAL SALPINGOOPHORECTOMY  01/04/2010    Home Medications:  Allergies as of 02/22/2022       Reactions   Vicodin [hydrocodone-acetaminophen] Nausea And Vomiting        Medication List        Accurate as of February 22, 2022  2:00 PM. If you have any questions, ask your nurse or doctor.          busPIRone 5 MG tablet Commonly known as: BUSPAR Take 1 tablet (5 mg total) by mouth 2 (two) times daily.   clonazePAM 0.5 MG tablet Commonly known as: KLONOPIN Take  0.25 mg twice daily for one week. Then, increase to 0.25 mg twice daily, continue with this dose.   escitalopram 10 MG tablet Commonly known as: Lexapro Take 1 tablet (10 mg total) by mouth daily.   gabapentin 300 MG capsule Commonly known as: NEURONTIN Take 300 mg by mouth 3 (three) times daily.   methylPREDNISolone 4 MG Tbpk tablet Commonly known as: MEDROL DOSEPAK Take as directed   omeprazole 20 MG capsule Commonly known as: PRILOSEC Take 1 capsule (20 mg total) by mouth 2  (two) times daily before a meal. Take 1 cap by mouth twice daily   QUEtiapine 25 MG tablet Commonly known as: SEROQUEL Take 1 tablet (25 mg total) by mouth at bedtime.   valsartan 40 MG tablet Commonly known as: DIOVAN Take 1 tablet (40 mg total) by mouth daily.        Allergies:  Allergies  Allergen Reactions   Vicodin [Hydrocodone-Acetaminophen] Nausea And Vomiting    Family History: Family History  Problem Relation Age of Onset   Heart disease Father 105   Tremor Father    Arthritis Paternal Grandmother    Heart disease Paternal Grandmother    Hypertension Paternal Grandmother    Heart disease Paternal Grandfather    Stroke Paternal Grandfather    Hypertension Paternal Grandfather    Cancer Brother        throat and brain cancer   Heart disease Brother        deceased from MI   Tremor Brother    Breast cancer Neg Hx     Social History:  reports that she has never smoked. She has never been exposed to tobacco smoke. She has never used smokeless tobacco. She reports that she does not drink alcohol and does not use drugs.  ROS:                                        Physical Exam: There were no vitals taken for this visit.  Constitutional:  Alert and oriented, No acute distress. HEENT: [place] AT, moist mucus membranes.  Trachea midline, no masses.   Laboratory Data: Lab Results  Component Value Date   WBC 4.2 12/01/2021   HGB 13.5 12/01/2021   HCT 40.0 12/01/2021   MCV 92 12/01/2021   PLT 248 12/01/2021    Lab Results  Component Value Date   CREATININE 0.65 10/26/2021    No results found for: "PSA"  No results found for: "TESTOSTERONE"  No results found for: "HGBA1C"  Urinalysis    Component Value Date/Time   COLORURINE Amber 01/31/2012 1838   APPEARANCEUR Clear 11/30/2021 1047   APPEARANCEUR Clear 11/30/2021 1047   LABSPEC 1.023 01/31/2012 1838   PHURINE 5.0 01/31/2012 1838   GLUCOSEU Negative 11/30/2021 1047    GLUCOSEU Negative 11/30/2021 1047   GLUCOSEU Negative 01/31/2012 1838   HGBUR 1+ 01/31/2012 1838   BILIRUBINUR Negative 11/30/2021 1047   BILIRUBINUR Negative 11/30/2021 1047   BILIRUBINUR Negative 01/31/2012 1838   KETONESUR Trace 01/31/2012 1838   PROTEINUR Negative 11/30/2021 1047   PROTEINUR Negative 11/30/2021 1047   PROTEINUR 100 mg/dL 01/31/2012 1838   NITRITE Negative 11/30/2021 1047   NITRITE Negative 11/30/2021 1047   NITRITE Positive 01/31/2012 1838   LEUKOCYTESUR 2+ (A) 11/30/2021 1047   LEUKOCYTESUR 2+ (A) 11/30/2021 1047   LEUKOCYTESUR 3+ 01/31/2012 1838    Pertinent Imaging:   Assessment & Plan:  I think it is reasonable to try to help her first before we order urodynamics and I discussed this.  We can order them in the future.  Reassess in 6 weeks on Myrbetriq 50 mg samples and prescription.  Call if urine culture positive.  I did mention that I might order urodynamics in the future  1. Urinary incontinence, unspecified type    No follow-ups on file.  Reece Packer, MD  [facility] [address] [place], [place] [address] [phone]

## 2022-02-25 LAB — CULTURE, URINE COMPREHENSIVE

## 2022-03-09 NOTE — Progress Notes (Unsigned)
There were no vitals taken for this visit.   Subjective:    Patient ID: Cynthia Sellers, female    DOB: 1965-06-21, 57 y.o.   MRN: [id number]  HPI: NIL ANSTETT is a 57 y.o. female  No chief complaint on file.  ABDOMINAL ISSUES Duration: months Nature: bloating, sharp, and cramping Location: LLQ and RLQ  Severity: 9/10  Radiation: no Episode duration: "a long time" Frequency: intermittent Alleviating factors: going to the bathroom Aggravating factors: eating greasy food Treatments attempted:  pepto bismal, immodium Constipation: no Diarrhea: yes Episodes of diarrhea/day: Mucous in the stool: no Heartburn: yes Bloating:yes Flatulence: yes Nausea: yes Vomiting:  sometimes Episodes of vomit/day: Melena or hematochezia: no Rash: no Jaundice: no Fever: no Weight loss: no  ARM PAIN Duration: weeks Location: left Mechanism of injury: unknown Onset: sudden Severity: moderate  Quality:  sharp Frequency: constant Radiation: no Aggravating factors: movement  Alleviating factors: rest  Status: stable Treatments attempted: rest  Relief with NSAIDs?:  no Swelling: no Redness: no  Warmth: no Trauma: no Chest pain: no  Shortness of breath: no  Fever: no Decreased sensation: no Paresthesias: no Weakness: no  Relevant past medical, surgical, family and social history reviewed and updated as indicated. Interim medical history since our last visit reviewed. Allergies and medications reviewed and updated.  Review of Systems  Gastrointestinal:  Positive for abdominal pain, diarrhea, nausea and vomiting. Negative for constipation.  Musculoskeletal:        Left arm pain    Per HPI unless specifically indicated above     Objective:    There were no vitals taken for this visit.  Wt Readings from Last 3 Encounters:  02/22/22 192 lb 12.8 oz (87.5 kg)  02/09/22 189 lb 8 oz (86 kg)  11/30/21 182 lb (82.6 kg)    Physical Exam Vitals and nursing note  reviewed.  Constitutional:      General: She is not in acute distress.    Appearance: Normal appearance. She is normal weight. She is not ill-appearing, toxic-appearing or diaphoretic.  HENT:     Head: Normocephalic.     Right Ear: External ear normal.     Left Ear: External ear normal.     Nose: Nose normal.     Mouth/Throat:     Mouth: Mucous membranes are moist.     Pharynx: Oropharynx is clear.  Eyes:     General:        Right eye: No discharge.        Left eye: No discharge.     Extraocular Movements: Extraocular movements intact.     Conjunctiva/sclera: Conjunctivae normal.     Pupils: Pupils are equal, round, and reactive to light.  Cardiovascular:     Rate and Rhythm: Normal rate and regular rhythm.     Heart sounds: No murmur heard. Pulmonary:     Effort: Pulmonary effort is normal. No respiratory distress.     Breath sounds: Normal breath sounds. No wheezing or rales.  Abdominal:     General: Abdomen is flat. Bowel sounds are normal. There is no distension.     Palpations: Abdomen is soft. There is no mass.     Tenderness: There is no abdominal tenderness. There is no right CVA tenderness, left CVA tenderness, guarding or rebound.     Hernia: No hernia is present.  Musculoskeletal:     Cervical back: Normal range of motion and neck supple.  Skin:    General: Skin is warm  and dry.     Capillary Refill: Capillary refill takes less than 2 seconds.  Neurological:     General: No focal deficit present.     Mental Status: She is alert and oriented to person, place, and time. Mental status is at baseline.  Psychiatric:        Mood and Affect: Mood normal.        Behavior: Behavior normal.        Thought Content: Thought content normal.        Judgment: Judgment normal.    Results for orders placed or performed in visit on 02/22/22  CULTURE, URINE COMPREHENSIVE   Specimen: Urine   UR  Result Value Ref Range   Urine Culture, Comprehensive Final report    Organism  ID, Bacteria Comment   Microscopic Examination   Urine  Result Value Ref Range   WBC, UA 6-10 (A) 0 - 5 /hpf   RBC, Urine 0-2 0 - 2 /hpf   Epithelial Cells (non renal) 0-10 0 - 10 /hpf   Bacteria, UA Few None seen/Few  Urinalysis, Complete  Result Value Ref Range   Specific Gravity, UA 1.015 1.005 - 1.030   pH, UA 7.0 5.0 - 7.5   Color, UA Yellow Yellow   Appearance Ur Clear Clear   Leukocytes,UA 1+ (A) Negative   Protein,UA Negative Negative/Trace   Glucose, UA Negative Negative   Ketones, UA Negative Negative   RBC, UA Negative Negative   Bilirubin, UA Negative Negative   Urobilinogen, Ur 0.2 0.2 - 1.0 mg/dL   Nitrite, UA Negative Negative   Microscopic Examination See below:       Assessment & Plan:   Problem List Items Addressed This Visit   None    Follow up plan: No follow-ups on file.

## 2022-03-10 ENCOUNTER — Ambulatory Visit (INDEPENDENT_AMBULATORY_CARE_PROVIDER_SITE_OTHER): Payer: PPO | Admitting: Nurse Practitioner

## 2022-03-10 ENCOUNTER — Encounter: Payer: Self-pay | Admitting: Nurse Practitioner

## 2022-03-10 VITALS — BP 118/83 | HR 80 | Temp 98.0°F | Wt 195.2 lb

## 2022-03-10 DIAGNOSIS — G4700 Insomnia, unspecified: Secondary | ICD-10-CM | POA: Diagnosis not present

## 2022-03-10 NOTE — Assessment & Plan Note (Signed)
Chronic. Not well controlled.  Dr. Melrose Nakayama has taken over managing patient's sleep medications. Discussed with patient the dose of Abilify that she should be taking.  Called Dr. Lannie Fields office and moved appointment up to Friday morning.  Follow up in 3 months.  Call sooner if concerns arise.

## 2022-03-12 DIAGNOSIS — R251 Tremor, unspecified: Secondary | ICD-10-CM | POA: Diagnosis not present

## 2022-03-12 DIAGNOSIS — G479 Sleep disorder, unspecified: Secondary | ICD-10-CM | POA: Diagnosis not present

## 2022-03-12 DIAGNOSIS — F419 Anxiety disorder, unspecified: Secondary | ICD-10-CM | POA: Diagnosis not present

## 2022-03-16 ENCOUNTER — Encounter: Payer: Self-pay | Admitting: Neurology

## 2022-03-22 NOTE — Progress Notes (Unsigned)
Assessment/Plan:   1.  Essential Tremor.  -She was sent here to assess candidacy for DBS.  Patient has major depressive disorder, uncontrolled, which would be a contraindication to DBS at this point in time.  Patient's depression would need to be under much better control.  -In addition to the above, patient would need to have trial of at least one of the first-line medications for tremor, either primidone or propranolol.  One might want to avoid propranolol until depression is under better control.   Subjective:   Cynthia Sellers was seen in consultation in the movement disorder clinic at the request of Jon Billings, NP.  She was sent here to see if she would be a DBS candidate for essential tremor.  Patient has been seen by [facility] neurology.  Records are reviewed.  She was first seen by [facility] neurology in July, 2023.  She was diagnosed with essential tremor.  At that point in time, she was started on gabapentin for essential tremor.  She was already on low-dose quetiapine and Lexapro at the time for a diagnosis of major depressive disorder.  When she was seen back in September, 2023, her quetiapine was increased and she was started on clonazepam in addition to her gabapentin and Lexapro.  1 month later, she was followed back up in BuSpar was added.  She was seen in January, 2024, her gabapentin was increased, quetiapine was stopped and Abilify was started.  Patient was last seen in March, 2024.  Current medications prescribed by [facility] neurology: Gabapentin 300 mg, 1 tablet in the morning, 1 in the afternoon and 2 to 3 tablets at bedtime Lexapro, 20 mg daily BuSpar 10 mg daily Aripiprazole, 10 mg at bedtime Clonazepam (was on 0.5 mg twice per day but stopped it because of headache and this was restarted at 0.25 mg at bedtime)  Affected by caffeine:  {yes no:314532} Affected by alcohol:  {yes no:314532} Affected by stress:  {yes no:314532} Affected by fatigue:  {yes  no:314532} Spills soup if on spoon:  {yes no:314532} Spills glass of liquid if full:  {yes no:314532} Affects ADL's (tying shoes, brushing teeth, etc):  {yes no:314532}  Current/Previously tried tremor medications: ***  Current medications that may exacerbate tremor:  ***Aripiprazole  Outside reports reviewed: {Outside review:15817}.  Allergies  Allergen Reactions   Vicodin [Hydrocodone-Acetaminophen] Nausea And Vomiting    No outpatient medications have been marked as taking for the 03/23/22 encounter (Appointment) with Trace Wirick, Eustace Quail, DO.      Objective:   VITALS:  There were no vitals filed for this visit. Gen:  Appears stated age and in NAD. HEENT:  Normocephalic, atraumatic. The mucous membranes are moist. The superficial temporal arteries are without ropiness or tenderness. Cardiovascular: Regular rate and rhythm. Lungs: Clear to auscultation bilaterally. Neck: There are no carotid bruits noted bilaterally.  NEUROLOGICAL:  Orientation:  The patient is alert and oriented x 3.   Cranial nerves: There is good facial symmetry. Extraocular muscles are intact and visual fields are full to confrontational testing. Speech is fluent and clear. Soft palate rises symmetrically and there is no tongue deviation. Hearing is intact to conversational tone. Tone: Tone is good throughout. Sensation: Sensation is intact to light touch touch throughout (facial, trunk, extremities). Vibration is intact at the bilateral big toe. There is no extinction with double simultaneous stimulation. There is no sensory dermatomal level identified. Coordination:  The patient has no dysdiadichokinesia or dysmetria. Motor: Strength is 5/5 in the bilateral upper and lower  extremities.  Shoulder shrug is equal bilaterally.  There is no pronator drift.  There are no fasciculations noted. DTR's: Deep tendon reflexes are 2/4 at the bilateral biceps, triceps, brachioradialis, patella and achilles.  Plantar  responses are downgoing bilaterally. Gait and Station: The patient is able to ambulate without difficulty. The patient is able to heel toe walk without any difficulty. The patient is able to ambulate in a tandem fashion. The patient is able to stand in the Romberg position.   MOVEMENT EXAM: Tremor:  There is *** tremor in the UE, noted most significantly with action.  The patient is *** able to draw Archimedes spirals without significant difficulty.  There is *** tremor at rest.  The patient is *** able to pour water from one glass to another without spilling it.  I have reviewed and interpreted the following labs independently   Chemistry      Component Value Date/Time   NA 142 10/26/2021 1124   NA 138 01/31/2012 1839   K 4.2 10/26/2021 1124   K 3.6 01/31/2012 1839   CL 103 10/26/2021 1124   CL 107 01/31/2012 1839   CO2 22 10/26/2021 1124   CO2 22 01/31/2012 1839   BUN 13 10/26/2021 1124   BUN 13 01/31/2012 1839   CREATININE 0.65 10/26/2021 1124   CREATININE 0.67 01/31/2012 1839      Component Value Date/Time   CALCIUM 9.1 10/26/2021 1124   CALCIUM 8.6 01/31/2012 1839   ALKPHOS 90 10/26/2021 1124   ALKPHOS 100 01/31/2012 1839   AST 18 10/26/2021 1124   AST 18 01/31/2012 1839   ALT 11 10/26/2021 1124   ALT 16 01/31/2012 1839   BILITOT 0.4 10/26/2021 1124   BILITOT 0.7 01/31/2012 1839      Lab Results  Component Value Date   WBC 4.2 12/01/2021   HGB 13.5 12/01/2021   HCT 40.0 12/01/2021   MCV 92 12/01/2021   PLT 248 12/01/2021   Lab Results  Component Value Date   TSH 1.240 10/26/2021      Total time spent on today's visit was ***60 minutes, including both face-to-face time and nonface-to-face time.  Time included that spent on review of records (prior notes available to me/labs/imaging if pertinent), discussing treatment and goals, answering patient's questions and coordinating care.  CC:  Jon Billings, NP

## 2022-03-23 ENCOUNTER — Ambulatory Visit: Payer: PPO | Admitting: Neurology

## 2022-03-23 ENCOUNTER — Encounter: Payer: Self-pay | Admitting: Neurology

## 2022-03-23 VITALS — BP 141/60 | HR 67 | Ht 64.0 in | Wt 195.8 lb

## 2022-03-23 DIAGNOSIS — G250 Essential tremor: Secondary | ICD-10-CM

## 2022-03-23 DIAGNOSIS — F331 Major depressive disorder, recurrent, moderate: Secondary | ICD-10-CM

## 2022-03-23 MED ORDER — PRIMIDONE 50 MG PO TABS: 100.0000 mg | ORAL_TABLET | Freq: Two times a day (BID) | ORAL | 1 refills | Status: DC

## 2022-03-23 NOTE — Patient Instructions (Addendum)
Week 1 Decrease abilify to 5 mg daily Start primidone 50 mg daily, 1/2 tablet at bedtime   Week 2 Continue abilify 5 mg daily Increase primidone 50 mg daily, 1 tablet at bedtime  Week 3 Continue abilify 5 mg daily Increase primidone 50 mg, 1 tablet twice per day  Week 4 Continue abilify 5 mg daily Increase primidone 50 mg, 1 in the AM, 2 at bed  Week 5 Continue abilify 5 mg daily Increase primidone 50 mg, 2 tablets twice per day thereafter  Week 6 STOP abilify Continue primidone 50 mg, 2 tablets twice per day thereafter  Let us know if mood is worse off of abilify but hopefully you will be able to get into psychiatry within a few months.  If you are suicidal, go to the ER

## 2022-04-19 ENCOUNTER — Ambulatory Visit: Payer: PPO | Admitting: Urology

## 2022-04-19 ENCOUNTER — Encounter: Payer: Self-pay | Admitting: Urology

## 2022-04-19 VITALS — BP 112/74 | HR 76 | Ht 64.0 in | Wt 192.0 lb

## 2022-04-19 DIAGNOSIS — R32 Unspecified urinary incontinence: Secondary | ICD-10-CM

## 2022-04-19 LAB — MICROSCOPIC EXAMINATION
Epithelial Cells (non renal): >10 /hpf — AB (ref 0–10)
WBC, UA: >30 /hpf — AB (ref 0–5)

## 2022-04-19 LAB — URINALYSIS, COMPLETE
Bilirubin, UA: NEGATIVE
Glucose, UA: NEGATIVE
Ketones, UA: NEGATIVE
Nitrite, UA: NEGATIVE
Protein,UA: NEGATIVE
RBC, UA: NEGATIVE
Specific Gravity, UA: >1.03 — ABNORMAL HIGH (ref 1.005–1.030)
Urobilinogen, Ur: 0.2 mg/dL (ref 0.2–1.0)
pH, UA: 5.5 (ref 5.0–7.5)

## 2022-04-19 NOTE — Progress Notes (Signed)
04/19/2022 1:03 PM   Cynthia Sellers [birth year] [id number]  Referring provider: Larae Grooms, NP [address] [place],  [place] [address]  Chief Complaint  Patient presents with   Follow-up   Urinary Incontinence    8 week follow-up    HPI: I was consulted to assist the patient's urinary incontinence.  She has urge incontinence.  She leaks with coughing sneezing but not bending and lifting.  She has high-volume bedwetting.  She wears 2 pads a day moderately wet   She voids every hour depending on fluid intake and cannot hold it for 2 hours.  She gets up 4-5 times at night.  Flow is reasonable   She had a hysterectomy and bladder suspension years ago   She has a tremor but I could not identify the underlying cause and I think is going to see neurology     Well supported bladder neck with a negative cough test and no significant prolapse    Patient has mixed incontinence. She has high-volume bedwetting. She has significant frequency and nocturia. Patient reports stress and urge components are both significant but likely primarily has an overactive bladder. She will return for urodynamics and cystoscopy.    Today Frequency stable.  Incontinence stable.  Last culture negative Patient never did the urodynamics since she was not called.   On pelvic examination she had a negative cough test with a strong cough after cystoscopy   Neurologist does not think she has parkinsonism but her on medicine.   Cystoscopy: normal  I think it is reasonable to try to help her first before we order urodynamics and I discussed this.  We can order them in the future.  Reassess in 6 weeks on Myrbetriq 50 mg samples and prescription.  Call if urine culture positive.  I did mention that I might order urodynamics in the future   Today Frequency stable.  Last culture negative Incontinence much better.  No more bedwetting.  Very happy.  Not infected.  Still does wear a pad         PMH: Past Medical History:  Diagnosis Date   Anxiety    Complication of anesthesia    Depression    GERD (gastroesophageal reflux disease)    Headache, tension type, episodic    Excedrin tension headache helps   Hypertension    Migraine    H/O MIGRAINES   PONV (postoperative nausea and vomiting)    WITH HYSTERECTOMY   Suicide attempt 1992   Took overdose of sleeping pills   Tremor of both hands     Surgical History: Past Surgical History:  Procedure Laterality Date   BILATERAL SALPINGOOPHORECTOMY  01/04/2010   COLONOSCOPY WITH PROPOFOL N/A 11/10/2020   Procedure: COLONOSCOPY WITH PROPOFOL;  Surgeon: Toney Reil, MD;  Location: [facility] ENDOSCOPY;  Service: Gastroenterology;  Laterality: N/A;   ESOPHAGOGASTRODUODENOSCOPY N/A 11/10/2020   Procedure: ESOPHAGOGASTRODUODENOSCOPY (EGD);  Surgeon: Toney Reil, MD;  Location: [facility] ENDOSCOPY;  Service: Gastroenterology;  Laterality: N/A;   HEEL SPUR EXCISION Left 01/05/2007   LIPOMA EXCISION Left 07/13/2017   Procedure: EXCISION LIPOMA-LEFT BREAST;  Surgeon: Earline Mayotte, MD;  Location: [facility] ORS;  Service: General;  Laterality: Left;   SUBMANDIBULAR GLAND EXCISION Right 06/10/2021   Procedure: EXCISION SUBMANDIBULAR GLAND;  Surgeon: Geanie Logan, MD;  Location: [facility] ORS;  Service: ENT;  Laterality: Right;   TOTAL ABDOMINAL HYSTERECTOMY W/ BILATERAL SALPINGOOPHORECTOMY  01/04/2010    Home Medications:  Allergies as of 04/19/2022  Reactions   Vicodin [hydrocodone-acetaminophen] Nausea And Vomiting        Medication List        Accurate as of April 19, 2022  1:03 PM. If you have any questions, ask your nurse or doctor.          ARIPiprazole 10 MG tablet Commonly known as: ABILIFY Take 10 mg by mouth in the morning and at bedtime.   busPIRone 5 MG tablet Commonly known as: BUSPAR Take 1 tablet (5 mg total) by mouth 2 (two) times daily.   clonazePAM 0.5 MG tablet Commonly known as:  KLONOPIN Take 0.25 mg twice daily for one week. Then, increase to 0.25 mg twice daily, continue with this dose.   escitalopram 10 MG tablet Commonly known as: Lexapro Take 1 tablet (10 mg total) by mouth daily.   gabapentin 300 MG capsule Commonly known as: NEURONTIN Take 300 mg by mouth 3 (three) times daily.   mirabegron ER 50 MG Tb24 tablet Commonly known as: MYRBETRIQ Take 50 mg by mouth daily.   omeprazole 20 MG capsule Commonly known as: PRILOSEC Take 1 capsule (20 mg total) by mouth 2 (two) times daily before a meal. Take 1 cap by mouth twice daily   primidone 50 MG tablet Commonly known as: MYSOLINE Take 2 tablets (100 mg total) by mouth 2 (two) times daily.   valsartan 40 MG tablet Commonly known as: DIOVAN Take 1 tablet (40 mg total) by mouth daily.        Allergies:  Allergies  Allergen Reactions   Vicodin [Hydrocodone-Acetaminophen] Nausea And Vomiting    Family History: Family History  Problem Relation Age of Onset   Heart disease Father 59   Tremor Father    Cancer Brother        throat and brain cancer   Heart disease Brother        deceased from MI   Tremor Brother    Cancer Brother        liver   Arthritis Paternal Grandmother    Heart disease Paternal Grandmother    Hypertension Paternal Grandmother    Heart disease Paternal Grandfather    Stroke Paternal Grandfather    Hypertension Paternal Grandfather    Cerebral aneurysm Son    Alcoholism Son    Healthy Son    Breast cancer Neg Hx     Social History:  reports that she has never smoked. She has never been exposed to tobacco smoke. She has never used smokeless tobacco. She reports that she does not drink alcohol and does not use drugs.  ROS:                                        Physical Exam: There were no vitals taken for this visit.  Constitutional:  Alert and oriented, No acute distress. HEENT: [place] AT, moist mucus membranes.  Trachea midline, no  masses.  Laboratory Data: Lab Results  Component Value Date   WBC 4.2 12/01/2021   HGB 13.5 12/01/2021   HCT 40.0 12/01/2021   MCV 92 12/01/2021   PLT 248 12/01/2021    Lab Results  Component Value Date   CREATININE 0.65 10/26/2021    No results found for: "PSA"  No results found for: "TESTOSTERONE"  No results found for: "HGBA1C"  Urinalysis    Component Value Date/Time   COLORURINE Amber 01/31/2012 1838   APPEARANCEUR  Clear 02/22/2022 1416   LABSPEC 1.023 01/31/2012 1838   PHURINE 5.0 01/31/2012 1838   GLUCOSEU Negative 02/22/2022 1416   GLUCOSEU Negative 01/31/2012 1838   HGBUR 1+ 01/31/2012 1838   BILIRUBINUR Negative 02/22/2022 1416   BILIRUBINUR Negative 01/31/2012 1838   KETONESUR Trace 01/31/2012 1838   PROTEINUR Negative 02/22/2022 1416   PROTEINUR 100 mg/dL 52/84/1324 4010   NITRITE Negative 02/22/2022 1416   NITRITE Positive 01/31/2012 1838   LEUKOCYTESUR 1+ (A) 02/22/2022 1416   LEUKOCYTESUR 3+ 01/31/2012 1838    Pertinent Imaging:   Assessment & Plan: Reassess durability in 4 months  1. Urinary incontinence, unspecified type  - Urinalysis, Complete   No follow-ups on file.  Martina Sinner, MD  [facility] [address], [address] [place], [place] [address] [phone]

## 2022-04-20 ENCOUNTER — Other Ambulatory Visit: Payer: Self-pay

## 2022-04-20 MED ORDER — MIRABEGRON ER 50 MG PO TB24: 50.0000 mg | ORAL_TABLET | Freq: Every day | ORAL | 11 refills | Status: DC

## 2022-04-26 DIAGNOSIS — F419 Anxiety disorder, unspecified: Secondary | ICD-10-CM | POA: Diagnosis not present

## 2022-04-26 DIAGNOSIS — G479 Sleep disorder, unspecified: Secondary | ICD-10-CM | POA: Diagnosis not present

## 2022-04-26 DIAGNOSIS — R251 Tremor, unspecified: Secondary | ICD-10-CM | POA: Diagnosis not present

## 2022-04-27 ENCOUNTER — Ambulatory Visit: Payer: PPO | Admitting: Neurology

## 2022-04-27 ENCOUNTER — Encounter: Payer: Self-pay | Admitting: Nurse Practitioner

## 2022-04-27 ENCOUNTER — Ambulatory Visit (INDEPENDENT_AMBULATORY_CARE_PROVIDER_SITE_OTHER): Payer: PPO | Admitting: Nurse Practitioner

## 2022-04-27 VITALS — BP 122/80 | HR 61 | Temp 98.0°F | Ht 64.0 in | Wt 192.1 lb

## 2022-04-27 DIAGNOSIS — E669 Obesity, unspecified: Secondary | ICD-10-CM | POA: Diagnosis not present

## 2022-04-27 DIAGNOSIS — F419 Anxiety disorder, unspecified: Secondary | ICD-10-CM | POA: Diagnosis not present

## 2022-04-27 DIAGNOSIS — G250 Essential tremor: Secondary | ICD-10-CM | POA: Diagnosis not present

## 2022-04-27 DIAGNOSIS — E7800 Pure hypercholesterolemia, unspecified: Secondary | ICD-10-CM | POA: Diagnosis not present

## 2022-04-27 DIAGNOSIS — G4700 Insomnia, unspecified: Secondary | ICD-10-CM | POA: Diagnosis not present

## 2022-04-27 DIAGNOSIS — I10 Essential (primary) hypertension: Secondary | ICD-10-CM

## 2022-04-27 MED ORDER — OMEPRAZOLE 20 MG PO CPDR: 20.0000 mg | DELAYED_RELEASE_CAPSULE | Freq: Two times a day (BID) | ORAL | 1 refills | Status: DC

## 2022-04-27 MED ORDER — ESCITALOPRAM OXALATE 20 MG PO TABS: 20.0000 mg | ORAL_TABLET | Freq: Every day | ORAL | 1 refills | Status: DC

## 2022-04-27 MED ORDER — BUSPIRONE HCL 5 MG PO TABS: 5.0000 mg | ORAL_TABLET | Freq: Two times a day (BID) | ORAL | 1 refills | Status: DC

## 2022-04-27 MED ORDER — VALSARTAN 40 MG PO TABS: 40.0000 mg | ORAL_TABLET | Freq: Every day | ORAL | 1 refills | Status: DC

## 2022-04-27 NOTE — Progress Notes (Signed)
BP 122/80 (BP Location: Left Arm, Cuff Size: Normal)   Pulse 61   Temp 98 F (36.7 C) (Oral)   Ht  (1.626 m)   Wt 192 lb 1.6 oz (87.1 kg)   SpO2 97%   BMI 32.97 kg/m    Subjective:    Patient ID: Cynthia Sellers, female    DOB: 1965/02/11, 57 y.o.   MRN: [id number]  HPI: Cynthia Sellers is a 57 y.o. female  Chief Complaint  Patient presents with   Anxiety   Depression   Hypertension   HYPERTENSION without Chronic Kidney Disease Hypertension status: controlled  Satisfied with current treatment? yes Duration of hypertension: years BP monitoring frequency:  not checking BP range:  BP medication side effects:  no Medication compliance: excellent compliance Previous BP meds:valsartan Aspirin: no Recurrent headaches: no Visual changes: no Palpitations: no Dyspnea: no Chest pain: no Lower extremity edema: no Dizzy/lightheaded: no  TREMOR Patient's tremor has worsened since our last visit.  She is seeing Neurology. 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  She has been assessed for deep brain stimulation.   DEPRESSION Patient will be making an appt with Dr. Vanetta Shawl in July for further evaluation and management of her depression and anxiety.  She is currently on Lexapro, Buspar and Clonazepam.      04/27/2022    9:50 AM 03/10/2022   10:59 AM 02/09/2022   10:53 AM 10/26/2021   11:01 AM 07/09/2021   11:06 AM  Depression screen PHQ 2/9  Decreased Interest 0 0  Down, Depressed, Hopeless PHQ - 2 Score Altered sleeping 0 Tired, decreased energy 0  Change in appetite 0  Feeling bad or failure about yourself  0 0 2 1 0  Trouble concentrating 0 0 Moving slowly or fidgety/restless 0 0 1 0 0  Suicidal thoughts 0 0 0 0 0  PHQ-9 Score Difficult doing work/chores Not difficult at all Very difficult Not difficult at all  Somewhat difficult    Relevant past medical,  surgical, family and social history reviewed and updated as indicated. Interim medical history since our last visit reviewed. Allergies and medications reviewed and updated.  Review of Systems  Eyes:  Negative for visual disturbance.  Respiratory:  Negative for cough, chest tightness and shortness of breath.   Cardiovascular:  Negative for chest pain, palpitations and leg swelling.  Neurological:  Positive for tremors. Negative for dizziness and headaches.  Psychiatric/Behavioral:  Positive for dysphoric mood. Negative for suicidal ideas. The patient is nervous/anxious.     Per HPI unless specifically indicated above     Objective:    BP 122/80 (BP Location: Left Arm, Cuff Size: Normal)   Pulse 61   Temp 98 F (36.7 C) (Oral)   Ht  (1.626 m)   Wt 192 lb 1.6 oz (87.1 kg)   SpO2 97%   BMI 32.97 kg/m   Wt Readings from Last 3 Encounters:  04/27/22 192 lb 1.6 oz (87.1 kg)  04/19/22 192 lb (87.1 kg)  03/23/22 195 lb 12.8 oz (88.8 kg)    Physical Exam Vitals and nursing note reviewed.  Constitutional:      General: She is not in acute distress.    Appearance: Normal appearance. She is  normal weight. She is not ill-appearing, toxic-appearing or diaphoretic.  HENT:     Head: Normocephalic.     Right Ear: External ear normal.     Left Ear: External ear normal.     Nose: Nose normal.     Mouth/Throat:     Mouth: Mucous membranes are moist.     Pharynx: Oropharynx is clear.  Eyes:     General:        Right eye: No discharge.        Left eye: No discharge.     Extraocular Movements: Extraocular movements intact.     Conjunctiva/sclera: Conjunctivae normal.     Pupils: Pupils are equal, round, and reactive to light.  Cardiovascular:     Rate and Rhythm: Normal rate and regular rhythm.     Heart sounds: No murmur heard. Pulmonary:     Effort: Pulmonary effort is normal. No respiratory distress.     Breath sounds: Normal breath sounds. No wheezing or rales.   Musculoskeletal:     Cervical back: Normal range of motion and neck supple.  Skin:    General: Skin is warm and dry.     Capillary Refill: Capillary refill takes less than 2 seconds.  Neurological:     General: No focal deficit present.     Mental Status: She is alert and oriented to person, place, and time. Mental status is at baseline.     Motor: Tremor (mouth and hands) present.  Psychiatric:        Mood and Affect: Mood normal.        Behavior: Behavior normal.        Thought Content: Thought content normal.        Judgment: Judgment normal.     Results for orders placed or performed in visit on 04/19/22  Microscopic Examination   Urine  Result Value Ref Range   WBC, UA >30 (A) 0 - 5 /hpf   RBC, Urine 0-2 0 - 2 /hpf   Epithelial Cells (non renal) >10 (A) 0 - 10 /hpf   Mucus, UA Present (A) Not Estab.   Bacteria, UA Many (A) None seen/Few  Urinalysis, Complete  Result Value Ref Range   Specific Gravity, UA >1.030 (H) 1.005 - 1.030   pH, UA 5.5 5.0 - 7.5   Color, UA Yellow Yellow   Appearance Ur Hazy (A) Clear   Leukocytes,UA 2+ (A) Negative   Protein,UA Negative Negative/Trace   Glucose, UA Negative Negative   Ketones, UA Negative Negative   RBC, UA Negative Negative   Bilirubin, UA Negative Negative   Urobilinogen, Ur 0.2 0.2 - 1.0 mg/dL   Nitrite, UA Negative Negative   Microscopic Examination See below:       Assessment & Plan:   Problem List Items Addressed This Visit       Cardiovascular and Mediastinum   Primary hypertension - Primary    Chronic.  Controlled.  Continue with current medication regimen of Valsartan 40mg  daily.  Refills sent today.  Labs ordered today.  Return to clinic in 6 months for reevaluation.  Call sooner if concerns arise.        Relevant Medications   valsartan (DIOVAN) 40 MG tablet   Other Relevant Orders   Comp Met (CMET)     Nervous and Auditory   Familial tremor   Relevant Orders   CBC w/Diff     Other   Anxiety     Chronic. Not well controlled.  Has  been seeing Neurology, Dr. Malvin Johns, who started Clonazepam.  Abilify was also started but patient had TD with and has since been weaned off medication by another Neurologist.  She was referred to psychiatry whom she will see in July.       Relevant Medications   escitalopram (LEXAPRO) 20 MG tablet   busPIRone (BUSPAR) 5 MG tablet   Other Relevant Orders   CBC w/Diff   Obesity (BMI 30.0-34.9)    Recommended eating smaller high protein, low fat meals more frequently and exercising 30 mins a day 5 times a week with a goal of 10-15lb weight loss in the next 3 months.       Relevant Orders   CBC w/Diff   Other Visit Diagnoses     Hypercholesteremia       Labs ordered at visit today.  Will make recommendations based on lab results.   Relevant Medications   valsartan (DIOVAN) 40 MG tablet   Other Relevant Orders   Lipid Profile        Follow up plan: Return in about 6 months (around 10/27/2022) for Physical and Fasting labs.

## 2022-04-27 NOTE — Assessment & Plan Note (Signed)
Chronic. Not well controlled.  Has been seeing Neurology, Dr. Malvin Johns, who started Clonazepam.  Abilify was also started but patient had TD with and has since been weaned off medication by another Neurologist.  She was referred to psychiatry whom she will see in July.

## 2022-04-27 NOTE — Assessment & Plan Note (Signed)
Recommended eating smaller high protein, low fat meals more frequently and exercising 30 mins a day 5 times a week with a goal of 10-15lb weight loss in the next 3 months.  

## 2022-04-27 NOTE — Assessment & Plan Note (Signed)
Chronic.  Controlled.  Continue with current medication regimen of Valsartan 40mg daily.  Refills sent today.  Labs ordered today.  Return to clinic in 6 months for reevaluation.  Call sooner if concerns arise.   

## 2022-04-28 LAB — COMPREHENSIVE METABOLIC PANEL
ALT: 9 IU/L (ref 0–32)
AST: 16 IU/L (ref 0–40)
Albumin/Globulin Ratio: 1.8 (ref 1.2–2.2)
Albumin: 4.3 g/dL (ref 3.8–4.9)
Alkaline Phosphatase: 101 IU/L (ref 44–121)
BUN/Creatinine Ratio: 19 (ref 9–23)
BUN: 12 mg/dL (ref 6–24)
Bilirubin Total: 0.3 mg/dL (ref 0.0–1.2)
CO2: 23 mmol/L (ref 20–29)
Calcium: 9.4 mg/dL (ref 8.7–10.2)
Chloride: 103 mmol/L (ref 96–106)
Creatinine, Ser: 0.63 mg/dL (ref 0.57–1.00)
Globulin, Total: 2.4 g/dL (ref 1.5–4.5)
Glucose: 75 mg/dL (ref 70–99)
Potassium: 4.3 mmol/L (ref 3.5–5.2)
Sodium: 141 mmol/L (ref 134–144)
Total Protein: 6.7 g/dL (ref 6.0–8.5)
eGFR: 103 mL/min/{1.73_m2} (ref >59)

## 2022-04-28 LAB — CBC WITH DIFFERENTIAL/PLATELET
Basophils Absolute: 0 10*3/uL (ref 0.0–0.2)
Basos: 0 %
EOS (ABSOLUTE): 0.1 10*3/uL (ref 0.0–0.4)
Eos: 2 %
Hematocrit: 42 % (ref 34.0–46.6)
Hemoglobin: 13.8 g/dL (ref 11.1–15.9)
Immature Grans (Abs): 0 10*3/uL (ref 0.0–0.1)
Immature Granulocytes: 0 %
Lymphocytes Absolute: 1.9 10*3/uL (ref 0.7–3.1)
Lymphs: 38 %
MCH: 30.9 pg (ref 26.6–33.0)
MCHC: 32.9 g/dL (ref 31.5–35.7)
MCV: 94 fL (ref 79–97)
Monocytes Absolute: 0.4 10*3/uL (ref 0.1–0.9)
Monocytes: 8 %
Neutrophils Absolute: 2.6 10*3/uL (ref 1.4–7.0)
Neutrophils: 52 %
Platelets: 246 10*3/uL (ref 150–450)
RBC: 4.46 x10E6/uL (ref 3.77–5.28)
RDW: 12.2 % (ref 11.7–15.4)
WBC: 4.9 10*3/uL (ref 3.4–10.8)

## 2022-04-28 LAB — LIPID PANEL
Chol/HDL Ratio: 2.8 ratio (ref 0.0–4.4)
Cholesterol, Total: 196 mg/dL (ref 100–199)
HDL: 69 mg/dL (ref >39)
LDL Chol Calc (NIH): 109 mg/dL — ABNORMAL HIGH (ref 0–99)
Triglycerides: 102 mg/dL (ref 0–149)
VLDL Cholesterol Cal: 18 mg/dL (ref 5–40)

## 2022-04-28 NOTE — Progress Notes (Signed)
Hi Cynthia Sellers. It was nice to see you yesterday.  Your lab work looks good.  No concerns at this time. Continue with your current medication regimen.  Follow up as discussed.  Please let me know if you have any questions.
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
## 2022-05-27 ENCOUNTER — Ambulatory Visit (INDEPENDENT_AMBULATORY_CARE_PROVIDER_SITE_OTHER): Payer: PPO | Admitting: Psychiatry

## 2022-05-27 ENCOUNTER — Encounter: Payer: Self-pay | Admitting: Psychiatry

## 2022-05-27 VITALS — BP 122/85 | HR 69 | Temp 97.4°F | Ht 64.0 in | Wt 188.6 lb

## 2022-05-27 DIAGNOSIS — F419 Anxiety disorder, unspecified: Secondary | ICD-10-CM | POA: Diagnosis not present

## 2022-05-27 DIAGNOSIS — F4321 Adjustment disorder with depressed mood: Secondary | ICD-10-CM

## 2022-05-27 DIAGNOSIS — F330 Major depressive disorder, recurrent, mild: Secondary | ICD-10-CM

## 2022-05-28 ENCOUNTER — Telehealth: Payer: Self-pay | Admitting: Neurology

## 2022-05-28 NOTE — Telephone Encounter (Signed)
I got the following message from Dr. Vanetta Shawl and appreciate the message:  I saw her today and although I wasn't sure if she was feeling worse when you saw her, her mood appears to be well-controlled. Despite her grief over the loss of her son, she is able to engage in activities such as going to church. She did mention experiencing drowsiness during the day. I advised her to consider discontinuing Klonopin and/or lowering the dose of gabapentin after discussing with you, as I believe these medications are prescribed for her tremors.   She mentioned that her son had a brain aneurysm and underwent brain surgery. It seems that the thought of DBS is triggering memories of this episode and making her miss her son more. This might explain why she seemed worse at her previous visit compared to today. She agreed that I would communicate this to you, so just FYI.   I did not adjust any medication, but she appears to be doing well from a mood standpoint despite discontinuation of Abilify. I will see her again in a few months.  Let me know if any other things you have any concern about this patient.

## 2022-06-10 ENCOUNTER — Ambulatory Visit: Payer: PPO | Admitting: Nurse Practitioner

## 2022-07-15 NOTE — Progress Notes (Signed)
Assessment/Plan:    1.  Essential Tremor  -I weaned her off of the Abilify.  She was never on Abilify very long, but continues to have mouth movements that look like tardive dyskinesia.  It is unclear if these were present prior to the start of Abilify.  Nonetheless, tardive dyskinesia certainly can be permanent.  I may give Ingrezza or Austedo a trial in the future for this.  -Patient is very sleepy.  It is difficult to tell exactly what medication is causing it, especially since she does not know exactly what medications she is taking.  She thought she was taking the clonazepam, but PDMP indicates it really has not been picked up for a long time.  I told her to go ahead and stop it if it was not already stopped.  I also need her to confirm with me how much gabapentin she is taking, as that has not been picked up for a while per PDMP  -Certainly, the primidone may be making her sleepy.  I am going to go ahead and just dose it all at night.  Instead of taking 100 mg twice per day, I will have her take 200 mg at bedtime.  -Her (step) mother was not here last visit and we had a long discussion regarding surgical interventions, both DBS and focused ultrasound.  Many questions were answered.  I am still not convinced that she is going to be a good candidate.  Her stepmother was really concerned that patient was not going to be able to tolerate it and was also concerned about the patient's mood.  These are my concerns as well.  Patient is seeing Dr. Vanetta Shawl, fortunately.  We are going to go ahead and send her for neurocognitive testing.  Her mother asks me about doing DBS under anesthesia, which is not really ideal, but nonetheless if patients are significantly depressed, it is not necessarily the surgery itself that is the problem, but the DBS itself that can create more depression.  -We talked about devices like [employer], but they are really cost prohibitive.  -I will plan on seeing her back after  cognitive testing is completed.  Subjective:   Cynthia Sellers was seen today in follow up for essential tremor.  My previous records were reviewed prior to todays visit.  Pt with her (step)mother who supplements hx.  Last visit, I weaned her off of the Abilify.  We also started her on Abilify and worked her up to 100 mg twice per day.  I did tell her that may not be enough, but she had never tried either primidone or propranolol.  I wanted to avoid propranolol until depression was under better control.  She has since seen Dr. Vanetta Shawl and I had the opportunity to talk with her about the patient's case.  Dr. Vanetta Shawl did note that mood appeared to be well-controlled when she saw her.  She stated that patient was experiencing some drowsiness during the day and she advised the patient to discontinue the clonazepam and/or lower the dose of gabapentin, but she asked the patient to discuss those things with me, as they were previously being prescribed by [facility] neurology for tremor.  She went back to [facility] neurology on April 26, 2022.  I reviewed the notes.  No medication changes were made.  She reports today that tremor is about the same.  She reports that mood has been depressed.  Pts mother reports the same.  Pt is not sure  of the meds she is on.  Not sure if the EDS got worse with the addition of primidone but potentially.  Mother reports mouth movements worse  Current prescribed movement disorder medications: Primidone, 50 mg, 2 tablets twice per day (started last visit) Current medications prescribed by [facility] neurology: Gabapentin 300 mg, 1 tablet in the morning, 1 in the afternoon and 2 to 3 tablets at bedtime (pt not sure how she is taking these - last picked up #150 on 04/15/22 per pdmp) Lexapro, 20 mg daily BuSpar 10 mg daily Clonazepam (pt thought she was still on 0.5 mg bid today but the last RX picked up per PDMP was 03/12/22 and was #30 pills)    ALLERGIES:   Allergies  Allergen  Reactions   Vicodin [Hydrocodone-Acetaminophen] Nausea And Vomiting    CURRENT MEDICATIONS:  Current Meds  Medication Sig   busPIRone (BUSPAR) 5 MG tablet Take 1 tablet (5 mg total) by mouth 2 (two) times daily.   clonazePAM (KLONOPIN) 0.5 MG tablet Take 0.5 mg by mouth daily.   escitalopram (LEXAPRO) 20 MG tablet Take 1 tablet (20 mg total) by mouth daily.   gabapentin (NEURONTIN) 300 MG capsule Take 300 mg by mouth 3 (three) times daily.   mirabegron ER (MYRBETRIQ) 50 MG TB24 tablet Take 1 tablet (50 mg total) by mouth daily.   omeprazole (PRILOSEC) 20 MG capsule Take 1 capsule (20 mg total) by mouth 2 (two) times daily before a meal. Take 1 cap by mouth twice daily   primidone (MYSOLINE) 50 MG tablet Take 2 tablets (100 mg total) by mouth 2 (two) times daily.   valsartan (DIOVAN) 40 MG tablet Take 1 tablet (40 mg total) by mouth daily.      Objective:    PHYSICAL EXAMINATION:    VITALS:   Vitals:   07/19/22 1336  BP: 122/84  Pulse: 72  SpO2: 97%  Weight: 191 lb 12.8 oz (87 kg)  Height: 5\' 4"  (1.626 m)    GEN:  The patient appears stated age and is in NAD. HEENT:  Normocephalic, atraumatic.  The mucous membranes are moist. The superficial temporal arteries are without ropiness or tenderness. CV:  RRR Lungs:  CTAB Neck/HEME:  There are no carotid bruits bilaterally.  Neurological examination:  Orientation: The patient is alert and oriented x3.  She is able to provide her history fairly well, but she is confused on her medication. Cranial nerves: There is good facial symmetry. The speech is fluent and clear. Soft palate rises symmetrically and there is no tongue deviation. Hearing is intact to conversational tone. Sensation: Sensation is intact to light touch throughout Motor: Strength is at least antigravity x4.  Movement examination: Tone: There is normal tone in the UE/LE Tremor: There is jaw tremor that is moderate. She does have some tongue movement within the  mouth, but not outside of the mouth.  This is similar to last visit.  She has mild tremor of the outstretched hands. She has moderate intention tremor bilaterally.  She has difficulty pouring water from 1 glass to another, but actually does better than last visit.  She has difficulty with Archimedes spirals on the left, but again this is better than last visit.   Coordination:  There is no decremation with RAM's Gait and Station: The patient has no difficulty arising out of a deep-seated chair without the use of the hands. The patient's stride length is good I have reviewed and interpreted the following labs independently   Chemistry  Component Value Date/Time   NA 141 04/27/2022 1001   NA 138 01/31/2012 1839   K 4.3 04/27/2022 1001   K 3.6 01/31/2012 1839   CL 103 04/27/2022 1001   CL 107 01/31/2012 1839   CO2 23 04/27/2022 1001   CO2 22 01/31/2012 1839   BUN 12 04/27/2022 1001   BUN 13 01/31/2012 1839   CREATININE 0.63 04/27/2022 1001   CREATININE 0.67 01/31/2012 1839      Component Value Date/Time   CALCIUM 9.4 04/27/2022 1001   CALCIUM 8.6 01/31/2012 1839   ALKPHOS 101 04/27/2022 1001   ALKPHOS 100 01/31/2012 1839   AST 16 04/27/2022 1001   AST 18 01/31/2012 1839   ALT 9 04/27/2022 1001   ALT 16 01/31/2012 1839   BILITOT 0.3 04/27/2022 1001   BILITOT 0.7 01/31/2012 1839      Lab Results  Component Value Date   WBC 4.9 04/27/2022   HGB 13.8 04/27/2022   HCT 42.0 04/27/2022   MCV 94 04/27/2022   PLT 246 04/27/2022   Lab Results  Component Value Date   TSH 1.240 10/26/2021     Chemistry      Component Value Date/Time   NA 141 04/27/2022 1001   NA 138 01/31/2012 1839   K 4.3 04/27/2022 1001   K 3.6 01/31/2012 1839   CL 103 04/27/2022 1001   CL 107 01/31/2012 1839   CO2 23 04/27/2022 1001   CO2 22 01/31/2012 1839   BUN 12 04/27/2022 1001   BUN 13 01/31/2012 1839   CREATININE 0.63 04/27/2022 1001   CREATININE 0.67 01/31/2012 1839      Component Value  Date/Time   CALCIUM 9.4 04/27/2022 1001   CALCIUM 8.6 01/31/2012 1839   ALKPHOS 101 04/27/2022 1001   ALKPHOS 100 01/31/2012 1839   AST 16 04/27/2022 1001   AST 18 01/31/2012 1839   ALT 9 04/27/2022 1001   ALT 16 01/31/2012 1839   BILITOT 0.3 04/27/2022 1001   BILITOT 0.7 01/31/2012 1839         Total time spent on today's visit was 49 minutes, including both face-to-face time and nonface-to-face time.  Time included that spent on review of records (prior notes available to me/labs/imaging if pertinent), discussing treatment and goals, answering patient's questions and coordinating care.  Cc:  Larae Grooms, NP

## 2022-07-19 ENCOUNTER — Ambulatory Visit: Payer: PPO | Admitting: Neurology

## 2022-07-19 VITALS — BP 122/84 | HR 72 | Ht 64.0 in | Wt 191.8 lb

## 2022-07-19 DIAGNOSIS — R413 Other amnesia: Secondary | ICD-10-CM

## 2022-07-19 DIAGNOSIS — G250 Essential tremor: Secondary | ICD-10-CM

## 2022-07-19 DIAGNOSIS — F331 Major depressive disorder, recurrent, moderate: Secondary | ICD-10-CM | POA: Diagnosis not present

## 2022-07-19 MED ORDER — PRIMIDONE 50 MG PO TABS: 200.0000 mg | ORAL_TABLET | Freq: Every day | ORAL | Status: DC

## 2022-07-19 NOTE — Patient Instructions (Addendum)
Decrease klonopin (clonazepam) to ONCE per day and STOP Take primidone, 50 mg, 4 tablets all at bed (instead of 2 twice per day)  You have been referred for a neurocognitive evaluation (i.e., evaluation of memory and thinking abilities). Please bring someone with you to this appointment if possible, as it is helpful for the neuropsychologist to hear from both you and another adult who knows you well. Please bring eyeglasses and hearing aids if you wear them and take any medications as you normally would. Please fully abstain from all alcohol, marijuana, or other substances prior to your appointment.   The evaluation will take approximately 2-3 hours and has two parts:   The first part is a clinical interview with the neuropsychologist, Dr. Milbert Coulter.  During the interview, the neuropsychologist will speak with you and the individual you brought to the appointment.    The second part of the evaluation is testing with the doctor's technician, aka psychometrician, Annabelle Harman or [employer]. During the testing, the technician will ask you to remember different types of material, solve problems, and answer some questionnaires. Your family member will not be present for this portion of the evaluation.   Please note: We have to reserve several hours of the neuropsychologist's time and the psychometrician's time for your evaluation appointment. As such, there is a No-Show fee of $100. If you are unable to attend any of your appointments, please contact our office as soon as possible to reschedule.

## 2022-07-25 ENCOUNTER — Other Ambulatory Visit: Payer: Self-pay | Admitting: Nurse Practitioner

## 2022-07-27 ENCOUNTER — Encounter: Payer: Self-pay | Admitting: Psychology

## 2022-07-27 DIAGNOSIS — K219 Gastro-esophageal reflux disease without esophagitis: Secondary | ICD-10-CM | POA: Insufficient documentation

## 2022-07-27 NOTE — Telephone Encounter (Signed)
Requested Prescriptions  Pending Prescriptions Disp Refills   busPIRone (BUSPAR) 5 MG tablet [Pharmacy Med Name: busPIRone HCl 5 MG Oral Tablet] 180 tablet 2    Sig: Take 1 tablet by mouth twice daily     Psychiatry: Anxiolytics/Hypnotics - Non-controlled Passed - 07/25/2022  7:53 AM      Passed - Valid encounter within last 12 months    Recent Outpatient Visits           3 months ago Primary hypertension   [place] [facility] Larae Grooms, NP   4 months ago Insomnia, unspecified type   [place] [facility] Larae Grooms, NP   5 months ago Diarrhea, unspecified type   [place] [facility] Larae Grooms, NP   9 months ago Annual physical exam   [place] [facility] Larae Grooms, NP   1 year ago Primary hypertension   [place] [facility] Larae Grooms, NP       Future Appointments             In 3 weeks MacDiarmid, Lorin Picket, MD [facility] [place]   In 3 months Larae Grooms, NP  [facility]

## 2022-07-28 ENCOUNTER — Ambulatory Visit: Payer: PPO | Admitting: Psychology

## 2022-07-28 ENCOUNTER — Encounter: Payer: Self-pay | Admitting: Psychology

## 2022-07-28 DIAGNOSIS — G250 Essential tremor: Secondary | ICD-10-CM

## 2022-07-28 DIAGNOSIS — F411 Generalized anxiety disorder: Secondary | ICD-10-CM

## 2022-07-28 DIAGNOSIS — G3184 Mild cognitive impairment, so stated: Secondary | ICD-10-CM

## 2022-07-28 DIAGNOSIS — R4189 Other symptoms and signs involving cognitive functions and awareness: Secondary | ICD-10-CM

## 2022-07-28 DIAGNOSIS — F330 Major depressive disorder, recurrent, mild: Secondary | ICD-10-CM | POA: Diagnosis not present

## 2022-07-28 HISTORY — DX: Mild cognitive impairment of uncertain or unknown etiology: G31.84

## 2022-07-28 NOTE — Progress Notes (Unsigned)
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 Score) Percentile     Letter Total Correct 14 (3) 1 Exceptionally Low    Category Total Correct 27 (6) 9 Below Average    Category Switching Total Correct 13 (10) 50 Average    Category Switching Accuracy 11 (9) 37 Average      Total Set Loss Errors 0 (13) 84 Above Average      Total Repetition Errors 1 (12) 75 Above Average       Language:     Verbal Fluency Test: Raw Score (T Score) Percentile     Phonemic Fluency (FAS) 14 (25) 1 Exceptionally Low    Animal Fluency 14 (38) 12 Below Average        NAB Language Module, Form 1: T Score Percentile     Naming 30/31 (55) 69 Average       Visuospatial/Visuoconstruction:     NAB Spatial Module, Form 1: T Score Percentile     Visual Discrimination 50 50 Average        Scaled Score Percentile   WAIS-IV Matrix Reasoning: 9 37 Average  WAIS-IV Visual Puzzles: 6 9 Below Average       Mood and Personality:      Raw Score Percentile   Beck Depression Inventory - II: 13 --- Within Normal Limits  PROMIS Anxiety Questionnaire: 17 --- Mild       Personality Assessment Inventory-SF: T Score  Percentile     Inconsistency --- --- ---     Infrequency 62 --- Within Normal Limits    Negative Impression 54 --- Within Normal Limits    Positive Impression 59 --- Moderate    Somatic Complaints 77 --- Elevated    Anxiety 76 --- Elevated    Anxiety-Related Disorders 66 --- Within Normal Limits    Depression 56 --- Within Normal Limits    Mania 42 --- Within Normal Limits    Paranoia 59 --- Within Normal Limits    Schizophrenia 52 --- Within Normal Limits    Borderline Features 54 ---  Within Normal Limits    Antisocial Features 55 --- Within Normal Limits    Alcohol Problems 45 --- Within Normal Limits    Drug Problems 65 --- Within Normal Limits    Aggression 51 --- Within Normal Limits    Suicidal Ideation 46 --- Within Normal Limits    Stress --- --- ---    Non Support 61 --- Within Normal Limits    Treatment Rejection 53 --- Within Normal Limits    Dominance 51 --- Within Normal Limits    Warmth 50 --- Within Normal Limits       Additional Questionnaires:      Raw Score Percentile   PROMIS Sleep Disturbance Questionnaire: 35 --- Moderate   Informed Consent and Coding/Compliance:   Ms. Recktenwald was provided with a verbal description of the nature and purpose of the present neuropsychological evaluation. Also reviewed were the foreseeable risks and/or discomforts and benefits of the procedure, limits of confidentiality, and mandatory reporting requirements of this provider. The patient was given the opportunity to ask questions and receive answers about the evaluation. Oral consent to participate was provided by the patient.   This evaluation was conducted by Newman Nickels, Ph.D., ABPP-CN, board certified clinical neuropsychologist. Ms. Gillott completed a comprehensive clinical interview with Dr. Milbert Coulter, billed as one unit [phone], and 170 minutes of cognitive testing and scoring, billed as one unit [phone] and five additional units [id number]. Psychometrist Wallace Keller, B.S., assisted Dr. Milbert Coulter with test administration and scoring  procedures. As a separate and discrete service, one unit [id number] and two units [phone] were billed for Dr. Tammy Sours time spent in interpretation and report writing.

## 2022-07-28 NOTE — Progress Notes (Signed)
   Psychometrician Note   Cognitive testing was administered to Cynthia Sellers by Wallace Keller, B.S. (psychometrist) under the supervision of Dr. Newman Nickels, Ph.D., licensed psychologist on 07/28/2022. Cynthia Sellers did not appear overtly distressed by the testing session per behavioral observation or responses across self-report questionnaires. Rest breaks were offered.    The battery of tests administered was selected by Dr. Newman Nickels, Ph.D. with consideration to Cynthia Sellers's current level of functioning, the nature of Cynthia Sellers symptoms, emotional and behavioral responses during interview, level of literacy, observed level of motivation/effort, and the nature of the referral question. This battery was communicated to the psychometrist. Communication between Dr. Newman Nickels, Ph.D. and the psychometrist was ongoing throughout the evaluation and Dr. Newman Nickels, Ph.D. was immediately accessible at all times. Dr. Newman Nickels, Ph.D. provided supervision to the psychometrist on the date of this service to the extent necessary to assure the quality of all services provided.    Cynthia Sellers will return within approximately 1-2 weeks for an interactive feedback session with Dr. Milbert Coulter at which time Cynthia Sellers, clinical impressions, and treatment recommendations will be reviewed in detail. Cynthia Sellers understands she can contact our office should she require our assistance before this time.  A total of 170 minutes of billable time were spent face-to-face with Cynthia Sellers by the psychometrist. This includes both test administration and scoring time. Billing for these services is reflected in the clinical report generated by Dr. Newman Nickels, Ph.D.  This note reflects time spent with the psychometrician and does not include test scores or any clinical interpretations made by Dr. Milbert Coulter. The full report will follow in a separate note.

## 2022-07-29 ENCOUNTER — Encounter: Payer: Self-pay | Admitting: Psychology

## 2022-08-02 ENCOUNTER — Ambulatory Visit: Payer: Self-pay

## 2022-08-02 ENCOUNTER — Ambulatory Visit (INDEPENDENT_AMBULATORY_CARE_PROVIDER_SITE_OTHER): Payer: PPO | Admitting: Family Medicine

## 2022-08-02 ENCOUNTER — Encounter: Payer: Self-pay | Admitting: Family Medicine

## 2022-08-02 VITALS — BP 128/87 | HR 69 | Temp 98.4°F | Wt 189.0 lb

## 2022-08-02 DIAGNOSIS — H9202 Otalgia, left ear: Secondary | ICD-10-CM | POA: Diagnosis not present

## 2022-08-02 MED ORDER — PREDNISONE 50 MG PO TABS: 50.0000 mg | ORAL_TABLET | Freq: Every day | ORAL | 0 refills | Status: DC

## 2022-08-02 MED ORDER — TRIAMCINOLONE ACETONIDE 40 MG/ML IJ SUSP
40.0000 mg | Freq: Once | INTRAMUSCULAR | Status: AC
Start: 2022-08-02 — End: 2022-08-02
  Administered 2022-08-02: 40 mg via INTRAMUSCULAR

## 2022-08-02 NOTE — Telephone Encounter (Signed)
     Chief Complaint: Earache, headache Symptoms: Above Frequency: Last night Pertinent Negatives: Patient denies fever Disposition: [] ED /[] Urgent Care (no appt availability in office) / [x] Appointment(In office/virtual)/ []  [facility]/ [] Home Care/ [] Refused Recommended Disposition /[] [facility]/ []  Follow-up with PCP Additional Notes: Mother agrees with appointment.  Reason for Disposition  [1] SEVERE pain AND [2] not improved 2 hours after taking analgesic medication (e.g., ibuprofen or acetaminophen)  Answer Assessment - Initial Assessment Questions 1. LOCATION: "Which ear is involved?"     Unsure 2. ONSET: "When did the ear start hurting"      Last night 3. SEVERITY: "How bad is the pain?"  (Scale 1-10; mild, moderate or severe)   - MILD (1-3): doesn't interfere with normal activities    - MODERATE (4-7): interferes with normal activities or awakens from sleep    - SEVERE (8-10): excruciating pain, unable to do any normal activities      Severe and headache 4. URI SYMPTOMS: "Do you have a runny nose or cough?"     No 5. FEVER: "Do you have a fever?" If Yes, ask: "What is your temperature, how was it measured, and when did it start?"     No 6. CAUSE: "Have you been swimming recently?", "How often do you use Q-TIPS?", "Have you had any recent air travel or scuba diving?"     No 7. OTHER SYMPTOMS: "Do you have any other symptoms?" (e.g., headache, stiff neck, dizziness, vomiting, runny nose, decreased hearing)     Headache 8. PREGNANCY: "Is there any chance you are pregnant?" "When was your last menstrual period?"     No  Protocols used: Davina Poke

## 2022-08-02 NOTE — Progress Notes (Signed)
BP 128/87   Pulse 69   Temp 98.4 F (36.9 C) (Oral)   Wt 189 lb (85.7 kg)   SpO2 98%   BMI 32.44 kg/m    Subjective:    Patient ID: Cynthia Sellers, female    DOB: April 09, 1965, 57 y.o.   MRN: [id number]  HPI: Cynthia Sellers is a 57 y.o. female  Chief Complaint  Patient presents with   Ear Pain   Headache    Pt states she has been dealing with the pain a week over left eye   UPPER RESPIRATORY TRACT INFECTION Duration: about a week Worst symptom: headache and year pain Fever: no Cough: yes Shortness of breath: no Wheezing: no Chest pain: no Chest tightness: no Chest congestion: no Nasal congestion: no Runny nose: no Post nasal drip: no Sneezing: no Sore throat: no Swollen glands: no Sinus pressure: yes Headache: yes Face pain: yes Toothache: no Ear pain: yes left Ear pressure: no  Eyes red/itching:no Eye drainage/crusting: no  Vomiting: no Rash: no Fatigue: yes Sick contacts: no Strep contacts: no  Context: worse Recurrent sinusitis: no Relief with OTC cold/cough medications: no  Treatments attempted: [facility] x1   Relevant past medical, surgical, family and social history reviewed and updated as indicated. Interim medical history since our last visit reviewed. Allergies and medications reviewed and updated.  Review of Systems  Constitutional: Negative.   HENT: Negative.    Respiratory: Negative.    Cardiovascular: Negative.   Gastrointestinal: Negative.   Musculoskeletal: Negative.   Neurological:  Positive for headaches. Negative for dizziness, tremors, seizures, syncope, facial asymmetry, speech difficulty, weakness, light-headedness and numbness.  Psychiatric/Behavioral: Negative.      Per HPI unless specifically indicated above     Objective:    BP 128/87   Pulse 69   Temp 98.4 F (36.9 C) (Oral)   Wt 189 lb (85.7 kg)   SpO2 98%   BMI 32.44 kg/m   Wt Readings from Last 3 Encounters:  08/02/22 189 lb (85.7 kg)  07/19/22 191 lb  12.8 oz (87 kg)  04/27/22 192 lb 1.6 oz (87.1 kg)    Physical Exam Vitals and nursing note reviewed.  Constitutional:      General: She is not in acute distress.    Appearance: Normal appearance. She is well-developed and normal weight. She is not ill-appearing, toxic-appearing or diaphoretic.  HENT:     Head: Normocephalic and atraumatic.     Right Ear: Hearing, tympanic membrane, ear canal and external ear normal.     Left Ear: Hearing, tympanic membrane, ear canal and external ear normal.     Nose: Nose normal. No nasal deformity, septal deviation, signs of injury, laceration, nasal tenderness, mucosal edema, congestion or rhinorrhea.     Mouth/Throat:     Mouth: Mucous membranes are moist.     Pharynx: Oropharynx is clear.     Tonsils: No tonsillar exudate or tonsillar abscesses. 0 on the right. 0 on the left.  Eyes:     General: No scleral icterus.       Right eye: No discharge.        Left eye: No discharge.     Extraocular Movements: Extraocular movements intact.     Conjunctiva/sclera: Conjunctivae normal.     Pupils: Pupils are equal, round, and reactive to light.  Cardiovascular:     Rate and Rhythm: Normal rate and regular rhythm.     Pulses: Normal pulses.     Heart sounds: Normal heart  sounds. No murmur heard.    No friction rub. No gallop.  Pulmonary:     Effort: Pulmonary effort is normal. No respiratory distress.     Breath sounds: Normal breath sounds. No stridor. No wheezing, rhonchi or rales.  Chest:     Chest wall: No tenderness.  Musculoskeletal:        General: Normal range of motion.     Cervical back: Normal range of motion and neck supple.  Skin:    General: Skin is warm and dry.     Capillary Refill: Capillary refill takes less than 2 seconds.     Coloration: Skin is not jaundiced or pale.     Findings: No bruising, erythema, lesion or rash.  Neurological:     General: No focal deficit present.     Mental Status: She is alert and oriented to  person, place, and time. Mental status is at baseline.  Psychiatric:        Mood and Affect: Mood normal.        Behavior: Behavior normal.        Thought Content: Thought content normal.        Judgment: Judgment normal.     Results for orders placed or performed in visit on 04/27/22  Comp Met (CMET)  Result Value Ref Range   Glucose 75 70 - 99 mg/dL   BUN 12 6 - 24 mg/dL   Creatinine, Ser 2.59 0.57 - 1.00 mg/dL   eGFR 563 >87 FI/EPP/2.95   BUN/Creatinine Ratio 19 9 - 23   Sodium 141 134 - 144 mmol/L   Potassium 4.3 3.5 - 5.2 mmol/L   Chloride 103 96 - 106 mmol/L   CO2 23 20 - 29 mmol/L   Calcium 9.4 8.7 - 10.2 mg/dL   Total Protein 6.7 6.0 - 8.5 g/dL   Albumin 4.3 3.8 - 4.9 g/dL   Globulin, Total 2.4 1.5 - 4.5 g/dL   Albumin/Globulin Ratio 1.8 1.2 - 2.2   Bilirubin Total 0.3 0.0 - 1.2 mg/dL   Alkaline Phosphatase 101 44 - 121 IU/L   AST 16 0 - 40 IU/L   ALT 9 0 - 32 IU/L  CBC w/Diff  Result Value Ref Range   WBC 4.9 3.4 - 10.8 x10E3/uL   RBC 4.46 3.77 - 5.28 x10E6/uL   Hemoglobin 13.8 11.1 - 15.9 g/dL   Hematocrit 18.8 41.6 - 46.6 %   MCV 94 79 - 97 fL   MCH 30.9 26.6 - 33.0 pg   MCHC 32.9 31.5 - 35.7 g/dL   RDW 60.6 30.1 - 60.1 %   Platelets 246 150 - 450 x10E3/uL   Neutrophils 52 Not Estab. %   Lymphs 38 Not Estab. %   Monocytes 8 Not Estab. %   Eos 2 Not Estab. %   Basos 0 Not Estab. %   Neutrophils Absolute 2.6 1.4 - 7.0 x10E3/uL   Lymphocytes Absolute 1.9 0.7 - 3.1 x10E3/uL   Monocytes Absolute 0.4 0.1 - 0.9 x10E3/uL   EOS (ABSOLUTE) 0.1 0.0 - 0.4 x10E3/uL   Basophils Absolute 0.0 0.0 - 0.2 x10E3/uL   Immature Granulocytes 0 Not Estab. %   Immature Grans (Abs) 0.0 0.0 - 0.1 x10E3/uL  Lipid Profile  Result Value Ref Range   Cholesterol, Total 196 100 - 199 mg/dL   Triglycerides 093 0 - 149 mg/dL   HDL 69 >23 mg/dL   VLDL Cholesterol Cal 18 5 - 40 mg/dL   LDL Chol Calc (NIH) 557 (  H) 0 - 99 mg/dL   Chol/HDL Ratio 2.8 0.0 - 4.4 ratio      Assessment &  Plan:   Problem List Items Addressed This Visit   None Visit Diagnoses     Left ear pain    -  Primary   Will treat with prednisone burst, if not significantly better in the next few days, will let us know. Call with any concerns.   Relevant Medications   triamcinolone acetonide (KENALOG-40) injection 40 mg (Start on 08/02/2022  4:15 PM)        Follow up plan: Return if symptoms worsen or fail to improve.

## 2022-08-03 ENCOUNTER — Ambulatory Visit: Payer: PPO | Admitting: Psychiatry

## 2022-08-04 ENCOUNTER — Encounter: Payer: PPO | Admitting: Psychology

## 2022-08-10 ENCOUNTER — Ambulatory Visit: Payer: PPO | Admitting: Psychiatry

## 2022-08-16 ENCOUNTER — Ambulatory Visit: Payer: PPO | Admitting: Urology

## 2022-08-17 ENCOUNTER — Ambulatory Visit: Payer: PPO | Admitting: Psychology

## 2022-08-17 DIAGNOSIS — F330 Major depressive disorder, recurrent, mild: Secondary | ICD-10-CM | POA: Diagnosis not present

## 2022-08-17 DIAGNOSIS — G3184 Mild cognitive impairment, so stated: Secondary | ICD-10-CM

## 2022-08-17 DIAGNOSIS — F411 Generalized anxiety disorder: Secondary | ICD-10-CM

## 2022-08-17 DIAGNOSIS — G250 Essential tremor: Secondary | ICD-10-CM | POA: Diagnosis not present

## 2022-08-17 NOTE — Progress Notes (Signed)
   Neuropsychology Feedback Session [name]. [facility] Department of Neurology  Reason for Referral:   Cynthia Sellers is a 57 y.o. right-handed Caucasian female referred by Kerin Salen, D.O., to characterize her current cognitive functioning and assist with diagnostic clarity and treatment planning in the context of essential tremor and ongoing consideration of surgical interventions.   Feedback:   Cynthia Sellers completed a comprehensive neuropsychological evaluation on 07/28/2022. Please refer to that encounter for the full report and recommendations. Briefly, results suggested an isolated impairment surrounding phonemic fluency. Performance variability was exhibited across encoding (i.e., learning) and retrieval aspects of memory. More mild variability was also exhibited across processing speed; however, the majority of these performances were appropriate. Across mood-related questionnaires, Cynthia Sellers elevated the positive impression management symptom validity scale, suggesting that she may have been actively attempting to portray herself in an overly favorable light. In this context, responses suggested quite prominent symptoms of anxiety, as well as distress stemming from a variety of somatic symptoms. She did not elevate depression scales across any questionnaires and generally denied acutely severe symptoms during interview. DBS considerations are included in her report.   Cynthia Sellers was accompanied by her husband during the current feedback session. Content of the current session focused on the results of her neuropsychological evaluation. Cynthia Sellers was given the opportunity to ask questions and her questions were answered. She was encouraged to reach out should additional questions arise. A copy of her report was provided at the conclusion of the visit.      One unit [phone] was billed for Dr. Tammy Sours time spent preparing for, conducting, and documenting the current  feedback session with Cynthia Sellers.
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
## 2022-08-23 ENCOUNTER — Ambulatory Visit: Payer: PPO | Admitting: Urology

## 2022-08-23 VITALS — BP 141/94 | HR 65 | Ht 64.0 in | Wt 190.2 lb

## 2022-08-23 DIAGNOSIS — N3946 Mixed incontinence: Secondary | ICD-10-CM

## 2022-08-23 LAB — URINALYSIS, COMPLETE
Bilirubin, UA: NEGATIVE
Glucose, UA: NEGATIVE
Ketones, UA: NEGATIVE
Nitrite, UA: NEGATIVE
Protein,UA: NEGATIVE
Specific Gravity, UA: 1.01 (ref 1.005–1.030)
Urobilinogen, Ur: 0.2 mg/dL (ref 0.2–1.0)
pH, UA: 6 (ref 5.0–7.5)

## 2022-08-23 LAB — MICROSCOPIC EXAMINATION
Epithelial Cells (non renal): >10 /hpf — AB (ref 0–10)
WBC, UA: >30 /hpf — AB (ref 0–5)

## 2022-08-23 MED ORDER — MIRABEGRON ER 50 MG PO TB24
50.0000 mg | ORAL_TABLET | Freq: Every day | ORAL | 3 refills | Status: AC
Start: 2022-08-23 — End: ?

## 2022-08-23 NOTE — Progress Notes (Signed)
08/23/2022 1:17 PM   Cynthia Sellers Jul 20, 1965 [id number]  Referring provider: Larae Grooms, NP [address] [place],  [place] [address]  Chief Complaint  Patient presents with   Urinary Incontinence    HPI: I was consulted to assist the patient's urinary incontinence.  She has urge incontinence.  She leaks with coughing sneezing but not bending and lifting.  She has high-volume bedwetting.  She wears 2 pads a day moderately wet   She voids every hour depending on fluid intake and cannot hold it for 2 hours.  She gets up 4-5 times at night.  Flow is reasonable   She had a hysterectomy and bladder suspension years ago   She has a tremor but I could not identify the underlying cause and I think is going to see neurology     Well supported bladder neck with a negative cough test and no significant prolapse    Patient has mixed incontinence. She has high-volume bedwetting. She has significant frequency and nocturia. Patient reports stress and urge components are both significant but likely primarily has an overactive bladder. She will return for urodynamics and cystoscopy.    Today Frequency stable.  Incontinence stable.  Last culture negative Patient never did the urodynamics since she was not called.   On pelvic examination she had a negative cough test with a strong cough after cystoscopy   Neurologist does not think she has parkinsonism but her on medicine.   Cystoscopy: normal   I think it is reasonable to try to help her first before we order urodynamics and I discussed this.  We can order them in the future.  Reassess in 6 weeks on Myrbetriq 50 mg samples and prescription.  Call if urine culture positive.  I did mention that I might order urodynamics in the future    Today Frequency stable.  Last culture negative Incontinence much better.  No more bedwetting.  Very happy.  Not infected.  Still does wear a pad Reassess in 4 months  Today Frequency stable.   Excellent continence.  No infections.  Very pleased   PMH: Past Medical History:  Diagnosis Date   Adenomatous polyp of ascending colon    Breast lipoma 07/01/2017   Complication of anesthesia    Essential tremor 09/11/2019   Gastric erosion    Generalized anxiety disorder 09/11/2019   GERD (gastroesophageal reflux disease)    Headache, tension type, episodic    Excedrin tension headache helps   Insomnia 09/15/2020   Major depressive disorder 09/11/2019   Mild cognitive impairment 07/28/2022   Obesity (BMI 30.0-34.9) 09/11/2019   Polyp of descending colon    PONV (postoperative nausea and vomiting)    with hysterectomy   Primary hypertension 05/19/2021   Suicide attempt 1992   overdose of sleeping pills   Urge urinary incontinence 09/11/2019    Surgical History: Past Surgical History:  Procedure Laterality Date   BILATERAL SALPINGOOPHORECTOMY  01/04/2010   COLONOSCOPY WITH PROPOFOL N/A 11/10/2020   Procedure: COLONOSCOPY WITH PROPOFOL;  Surgeon: Toney Reil, MD;  Location: [facility] ENDOSCOPY;  Service: Gastroenterology;  Laterality: N/A;   ESOPHAGOGASTRODUODENOSCOPY N/A 11/10/2020   Procedure: ESOPHAGOGASTRODUODENOSCOPY (EGD);  Surgeon: Toney Reil, MD;  Location: [facility] ENDOSCOPY;  Service: Gastroenterology;  Laterality: N/A;   HEEL SPUR EXCISION Left 01/05/2007   LIPOMA EXCISION Left 07/13/2017   Procedure: EXCISION LIPOMA-LEFT BREAST;  Surgeon: Earline Mayotte, MD;  Location: [facility] ORS;  Service: General;  Laterality: Left;   SUBMANDIBULAR GLAND  EXCISION Right 06/10/2021   Procedure: EXCISION SUBMANDIBULAR GLAND;  Surgeon: Geanie Logan, MD;  Location: [facility] ORS;  Service: ENT;  Laterality: Right;   TOTAL ABDOMINAL HYSTERECTOMY W/ BILATERAL SALPINGOOPHORECTOMY  01/04/2010    Home Medications:  Allergies as of 08/23/2022       Reactions   Vicodin [hydrocodone-acetaminophen] Nausea And Vomiting        Medication List        Accurate as of August 23, 2022  1:17 PM. If you have any questions, ask your nurse or doctor.          busPIRone 5 MG tablet Commonly known as: BUSPAR Take 1 tablet by mouth twice daily   escitalopram 20 MG tablet Commonly known as: Lexapro Take 1 tablet (20 mg total) by mouth daily.   gabapentin 300 MG capsule Commonly known as: NEURONTIN Take 300 mg by mouth 3 (three) times daily.   mirabegron ER 50 MG Tb24 tablet Commonly known as: MYRBETRIQ Take 1 tablet (50 mg total) by mouth daily.   omeprazole 20 MG capsule Commonly known as: PRILOSEC Take 1 capsule (20 mg total) by mouth 2 (two) times daily before a meal. Take 1 cap by mouth twice daily   predniSONE 50 MG tablet Commonly known as: DELTASONE Take 1 tablet (50 mg total) by mouth daily with breakfast.   primidone 50 MG tablet Commonly known as: MYSOLINE Take 4 tablets (200 mg total) by mouth at bedtime.   valsartan 40 MG tablet Commonly known as: DIOVAN Take 1 tablet (40 mg total) by mouth daily.        Allergies:  Allergies  Allergen Reactions   Vicodin [Hydrocodone-Acetaminophen] Nausea And Vomiting    Family History: Family History  Problem Relation Age of Onset   Heart disease Father 95   Tremor Father    Treacher Collins syndrome Father    Cancer Brother        throat and brain cancer   Heart disease Brother        deceased from MI   Tremor Brother    Cancer Brother        liver   Arthritis Paternal Grandmother    Heart disease Paternal Grandmother    Hypertension Paternal Grandmother    Heart disease Paternal Grandfather    Stroke Paternal Grandfather    Hypertension Paternal Grandfather    Cerebral aneurysm Son    Alcoholism Son    Healthy Son    Tremor Cousin    Tremor Paternal Aunt    Breast cancer Neg Hx     Social History:  reports that she has never smoked. She has never been exposed to tobacco smoke. She has never used smokeless tobacco. She reports that she does not drink alcohol and does not use  drugs.  ROS:                                        Physical Exam: There were no vitals taken for this visit.  Constitutional:  Alert and oriented, No acute distress. HEENT: [place] AT, moist mucus membranes.  Trachea midline, no masses.   Laboratory Data: Lab Results  Component Value Date   WBC 4.9 04/27/2022   HGB 13.8 04/27/2022   HCT 42.0 04/27/2022   MCV 94 04/27/2022   PLT 246 04/27/2022    Lab Results  Component Value Date   CREATININE 0.63 04/27/2022  No results found for: "PSA"  No results found for: "TESTOSTERONE"  No results found for: "HGBA1C"  Urinalysis    Component Value Date/Time   COLORURINE Amber 01/31/2012 1838   APPEARANCEUR Hazy (A) 04/19/2022 1302   LABSPEC 1.023 01/31/2012 1838   PHURINE 5.0 01/31/2012 1838   GLUCOSEU Negative 04/19/2022 1302   GLUCOSEU Negative 01/31/2012 1838   HGBUR 1+ 01/31/2012 1838   BILIRUBINUR Negative 04/19/2022 1302   BILIRUBINUR Negative 01/31/2012 1838   KETONESUR Trace 01/31/2012 1838   PROTEINUR Negative 04/19/2022 1302   PROTEINUR 100 mg/dL 46/96/2952 8413   NITRITE Negative 04/19/2022 1302   NITRITE Positive 01/31/2012 1838   LEUKOCYTESUR 2+ (A) 04/19/2022 1302   LEUKOCYTESUR 3+ 01/31/2012 1838    Pertinent Imaging:   Assessment & Plan: 90 x 3 sent to pharmacy Myrbetriq and I will see her in 1 year  1. Mixed stress and urge urinary incontinence  - Urinalysis, Complete   No follow-ups on file.  Martina Sinner, MD  [facility] [address] [place], [place] [address] [phone]

## 2022-08-24 ENCOUNTER — Ambulatory Visit (INDEPENDENT_AMBULATORY_CARE_PROVIDER_SITE_OTHER): Payer: PPO | Admitting: Psychiatry

## 2022-08-24 ENCOUNTER — Encounter: Payer: Self-pay | Admitting: Psychiatry

## 2022-08-24 VITALS — BP 135/85 | HR 78 | Temp 97.8°F | Ht 64.0 in | Wt 189.6 lb

## 2022-08-24 DIAGNOSIS — F419 Anxiety disorder, unspecified: Secondary | ICD-10-CM | POA: Diagnosis not present

## 2022-08-24 DIAGNOSIS — F4321 Adjustment disorder with depressed mood: Secondary | ICD-10-CM

## 2022-08-24 DIAGNOSIS — F330 Major depressive disorder, recurrent, mild: Secondary | ICD-10-CM | POA: Diagnosis not present

## 2022-08-24 NOTE — Patient Instructions (Signed)
Change to take lexapro 20 mg at night  Continue buspar 5 mg at night  Next appointment: 10/8 at 11:30

## 2022-09-16 DIAGNOSIS — F419 Anxiety disorder, unspecified: Secondary | ICD-10-CM | POA: Diagnosis not present

## 2022-09-16 DIAGNOSIS — Z8659 Personal history of other mental and behavioral disorders: Secondary | ICD-10-CM | POA: Diagnosis not present

## 2022-09-16 DIAGNOSIS — R251 Tremor, unspecified: Secondary | ICD-10-CM | POA: Diagnosis not present
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
## 2022-10-12 ENCOUNTER — Encounter: Payer: Self-pay | Admitting: Psychiatry

## 2022-10-12 ENCOUNTER — Ambulatory Visit (INDEPENDENT_AMBULATORY_CARE_PROVIDER_SITE_OTHER): Payer: PPO | Admitting: Psychiatry

## 2022-10-12 VITALS — BP 135/85 | HR 69 | Temp 97.7°F | Ht 64.0 in | Wt 191.0 lb

## 2022-10-12 DIAGNOSIS — F4321 Adjustment disorder with depressed mood: Secondary | ICD-10-CM

## 2022-10-12 DIAGNOSIS — R632 Polyphagia: Secondary | ICD-10-CM | POA: Diagnosis not present

## 2022-10-12 DIAGNOSIS — F419 Anxiety disorder, unspecified: Secondary | ICD-10-CM

## 2022-10-12 DIAGNOSIS — F3341 Major depressive disorder, recurrent, in partial remission: Secondary | ICD-10-CM | POA: Diagnosis not present

## 2022-10-12 NOTE — Patient Instructions (Signed)
Continue lexapro 20 mg at night Continue Buspar 5 mg at night  Next appointment: 12/12 at 10:30

## 2022-10-27 ENCOUNTER — Encounter: Payer: PPO | Admitting: Nurse Practitioner

## 2022-10-27 NOTE — Progress Notes (Unsigned)
There were no vitals taken for this visit.   Subjective:    Patient ID: Cynthia Sellers, female    DOB: 1965-10-29, 57 y.o.   MRN: [id number]  HPI: Cynthia Sellers is a 57 y.o. female  No chief complaint on file.  HYPERTENSION without Chronic Kidney Disease Hypertension status: controlled  Satisfied with current treatment? yes Duration of hypertension: years BP monitoring frequency:  not checking BP range:  BP medication side effects:  no Medication compliance: excellent compliance Previous BP meds:valsartan Aspirin: no Recurrent headaches: no Visual changes: no Palpitations: no Dyspnea: no Chest pain: no Lower extremity edema: no Dizzy/lightheaded: no  TREMOR Patient's tremor has worsened since our last visit.  She is seeing Neurology. 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  She has been assessed for deep brain stimulation.   DEPRESSION Patient will be making an appt with Dr. Vanetta Shawl in July for further evaluation and management of her depression and anxiety.  She is currently on Lexapro, Buspar and Clonazepam.      10/12/2022   12:47 PM 05/27/2022   11:35 AM 04/27/2022    9:50 AM 03/10/2022   10:59 AM 02/09/2022   10:53 AM  Depression screen PHQ 2/9  Decreased Interest   0 2 2  Down, Depressed, Hopeless   1 2 1   PHQ - 2 Score   1 4 3   Altered sleeping   0 3 3  Tired, decreased energy   1 3 3   Change in appetite   2 3 3   Feeling bad or failure about yourself    0 0 2  Trouble concentrating   0 0 1  Moving slowly or fidgety/restless   0 0 1  Suicidal thoughts   0 0 0  PHQ-9 Score   4 13 16   Difficult doing work/chores   Not difficult at all Very difficult Not difficult at all     Information is confidential and restricted. Go to Review Flowsheets to unlock data.    Relevant past medical, surgical, family and social history reviewed and updated as indicated. Interim medical history since our last visit reviewed. Allergies and  medications reviewed and updated.  Review of Systems  Eyes:  Negative for visual disturbance.  Respiratory:  Negative for cough, chest tightness and shortness of breath.   Cardiovascular:  Negative for chest pain, palpitations and leg swelling.  Neurological:  Positive for tremors. Negative for dizziness and headaches.  Psychiatric/Behavioral:  Positive for dysphoric mood. Negative for suicidal ideas. The patient is nervous/anxious.     Per HPI unless specifically indicated above     Objective:    There were no vitals taken for this visit.  Wt Readings from Last 3 Encounters:  08/23/22 190 lb 4 oz (86.3 kg)  08/02/22 189 lb (85.7 kg)  07/19/22 191 lb 12.8 oz (87 kg)    Physical Exam Vitals and nursing note reviewed.  Constitutional:      General: She is not in acute distress.    Appearance: Normal appearance. She is normal weight. She is not ill-appearing, toxic-appearing or diaphoretic.  HENT:     Head: Normocephalic.     Right Ear: External ear normal.     Left Ear: External ear normal.     Nose: Nose normal.     Mouth/Throat:     Mouth: Mucous membranes are moist.     Pharynx: Oropharynx is clear.  Eyes:     General:  Right eye: No discharge.        Left eye: No discharge.     Extraocular Movements: Extraocular movements intact.     Conjunctiva/sclera: Conjunctivae normal.     Pupils: Pupils are equal, round, and reactive to light.  Cardiovascular:     Rate and Rhythm: Normal rate and regular rhythm.     Heart sounds: No murmur heard. Pulmonary:     Effort: Pulmonary effort is normal. No respiratory distress.     Breath sounds: Normal breath sounds. No wheezing or rales.  Musculoskeletal:     Cervical back: Normal range of motion and neck supple.  Skin:    General: Skin is warm and dry.     Capillary Refill: Capillary refill takes less than 2 seconds.  Neurological:     General: No focal deficit present.     Mental Status: She is alert and oriented to  person, place, and time. Mental status is at baseline.     Motor: Tremor (mouth and hands) present.  Psychiatric:        Mood and Affect: Mood normal.        Behavior: Behavior normal.        Thought Content: Thought content normal.        Judgment: Judgment normal.     Results for orders placed or performed in visit on 08/23/22  Microscopic Examination   Urine  Result Value Ref Range   WBC, UA >30 (A) 0 - 5 /hpf   RBC, Urine 3-10 (A) 0 - 2 /hpf   Epithelial Cells (non renal) >10 (A) 0 - 10 /hpf   Renal Epithel, UA 0-10 (A) None seen /hpf   Bacteria, UA Many (A) None seen/Few  Urinalysis, Complete  Result Value Ref Range   Specific Gravity, UA 1.010 1.005 - 1.030   pH, UA 6.0 5.0 - 7.5   Color, UA Yellow Yellow   Appearance Ur Hazy (A) Clear   Leukocytes,UA 3+ (A) Negative   Protein,UA Negative Negative/Trace   Glucose, UA Negative Negative   Ketones, UA Negative Negative   RBC, UA Trace (A) Negative   Bilirubin, UA Negative Negative   Urobilinogen, Ur 0.2 0.2 - 1.0 mg/dL   Nitrite, UA Negative Negative   Microscopic Examination See below:       Assessment & Plan:   Problem List Items Addressed This Visit   None     Follow up plan: No follow-ups on file.

## 2022-10-28 ENCOUNTER — Ambulatory Visit: Payer: PPO | Admitting: Nurse Practitioner

## 2022-10-28 ENCOUNTER — Encounter: Payer: Self-pay | Admitting: Nurse Practitioner

## 2022-10-28 VITALS — BP 130/84 | HR 71 | Temp 98.8°F | Ht 63.4 in | Wt 188.8 lb

## 2022-10-28 DIAGNOSIS — Z7189 Other specified counseling: Secondary | ICD-10-CM

## 2022-10-28 DIAGNOSIS — Z136 Encounter for screening for cardiovascular disorders: Secondary | ICD-10-CM | POA: Diagnosis not present

## 2022-10-28 DIAGNOSIS — F329 Major depressive disorder, single episode, unspecified: Secondary | ICD-10-CM

## 2022-10-28 DIAGNOSIS — G250 Essential tremor: Secondary | ICD-10-CM

## 2022-10-28 DIAGNOSIS — E669 Obesity, unspecified: Secondary | ICD-10-CM

## 2022-10-28 DIAGNOSIS — Z0000 Encounter for general adult medical examination without abnormal findings: Secondary | ICD-10-CM

## 2022-10-28 DIAGNOSIS — Z6833 Body mass index (BMI) 33.0-33.9, adult: Secondary | ICD-10-CM | POA: Diagnosis not present

## 2022-10-28 DIAGNOSIS — F330 Major depressive disorder, recurrent, mild: Secondary | ICD-10-CM

## 2022-10-28 DIAGNOSIS — Z23 Encounter for immunization: Secondary | ICD-10-CM | POA: Diagnosis not present

## 2022-10-28 DIAGNOSIS — I10 Essential (primary) hypertension: Secondary | ICD-10-CM

## 2022-10-28 DIAGNOSIS — F411 Generalized anxiety disorder: Secondary | ICD-10-CM

## 2022-10-28 DIAGNOSIS — E66811 Obesity, class 1: Secondary | ICD-10-CM

## 2022-10-28 LAB — URINALYSIS, ROUTINE W REFLEX MICROSCOPIC
Bilirubin, UA: NEGATIVE
Glucose, UA: NEGATIVE
Ketones, UA: NEGATIVE
Nitrite, UA: NEGATIVE
Protein,UA: NEGATIVE
Specific Gravity, UA: 1.02 (ref 1.005–1.030)
Urobilinogen, Ur: 1 mg/dL (ref 0.2–1.0)
pH, UA: 6 (ref 5.0–7.5)

## 2022-10-28 LAB — MICROSCOPIC EXAMINATION: Bacteria, UA: NONE SEEN

## 2022-10-28 MED ORDER — VALSARTAN 40 MG PO TABS: 40.0000 mg | ORAL_TABLET | Freq: Every day | ORAL | 1 refills | Status: DC

## 2022-10-28 NOTE — Assessment & Plan Note (Signed)
A voluntary discussion about advance care planning including the explanation and discussion of advance directives was extensively discussed  with the patient for 5 minutes with patient.  Explanation about the health care proxy and Living will was reviewed and packet with forms with explanation of how to fill them out was given.  During this discussion, the patient was able to identify a health care proxy as her mom and plans to fill out the paperwork required.  Patient was offered a separate Advance Care Planning visit for further assistance with forms.

## 2022-10-28 NOTE — Assessment & Plan Note (Signed)
Recommended eating smaller high protein, low fat meals more frequently and exercising 30 mins a day 5 times a week with a goal of 10-15lb weight loss in the next 3 months. Discussed cutting out sodas and sweet tea.

## 2022-10-28 NOTE — Assessment & Plan Note (Signed)
Following up with Dr. Arbutus Leas.  Advised patient decrease caffeine use to see if it improves symptoms.  Continue to collaborate with specialist.

## 2022-10-28 NOTE — Progress Notes (Signed)
BP 130/84   Pulse 71   Temp 98.8 F (37.1 C) (Oral)   Ht 5' 3.4" (1.61 m)   Wt 188 lb 12.8 oz (85.6 kg)   SpO2 96%   BMI 33.02 kg/m    Subjective:    Patient ID: Cynthia Sellers, female    DOB: 1965-09-26, 57 y.o.   MRN: [id number]  HPI: Cynthia Sellers is a 58 y.o. female presenting on 10/28/2022 for comprehensive medical examination. Current medical complaints include:none  She currently lives with: Menopausal Symptoms: no  HYPERTENSION without Chronic Kidney Disease Hypertension status: controlled  Satisfied with current treatment? yes Duration of hypertension: years BP monitoring frequency:  not checking BP range:  BP medication side effects:  no Medication compliance: excellent compliance Previous BP meds:valsartan Aspirin: no Recurrent headaches: no Visual changes: no Palpitations: no Dyspnea: no Chest pain: no Lower extremity edema: no Dizzy/lightheaded: no  TREMOR Patient's tremor has is about the same.  She states she drinks a lot of caffeine.  She hasn't cut out the caffeine which she feels like would probably help with her tremor.    DEPRESSION Patient will be making an appt with Dr. Vanetta Shawl in October.  She is currently on the Lexapro and Buspar.  She is no longer on the Clonazepam.    Functional Status Survey: Is the patient deaf or have difficulty hearing?: No Does the patient have difficulty seeing, even when wearing glasses/contacts?: Yes Does the patient have difficulty concentrating, remembering, or making decisions?: No Does the patient have difficulty walking or climbing stairs?: No Does the patient have difficulty dressing or bathing?: No Does the patient have difficulty doing errands alone such as visiting a doctor's office or shopping?: No     10/28/2022    9:52 AM 07/19/2022    1:37 PM 04/27/2022    9:49 AM 03/23/2022    1:14 PM 03/10/2022   10:59 AM  Fall Risk   Falls in the past year? 0 0 0 0 0  Number falls in past yr: 0 0 0 0  0  Injury with Fall? 0  0 0 0  Risk for fall due to : No Fall Risks  No Fall Risks  No Fall Risks  Follow up Falls evaluation completed Falls evaluation completed Falls evaluation completed Falls evaluation completed Falls evaluation completed    Depression Screen    10/28/2022    9:53 AM 10/12/2022   12:47 PM 05/27/2022   11:35 AM 04/27/2022    9:50 AM 03/10/2022   10:59 AM  Depression screen PHQ 2/9  Decreased Interest 0 0 2 0 2  Down, Depressed, Hopeless 0 0 0 1 2  PHQ - 2 Score 0 0 2 1 4   Altered sleeping 0  2 0 3  Tired, decreased energy 1  2 1 3   Change in appetite 3  3 2 3   Feeling bad or failure about yourself  0  0 0 0  Trouble concentrating 0  3 0 0  Moving slowly or fidgety/restless 0  0 0 0  Suicidal thoughts   0 0 0  PHQ-9 Score 4  12 4 13   Difficult doing work/chores Not difficult at all   Not difficult at all Very difficult     Advanced Directives Does patient have a HCPOA?    no If yes, name and contact information: Her Mom Does patient have a living will or MOST form?  no  Past Medical History:  Past Medical History:  Diagnosis  Date   Adenomatous polyp of ascending colon    Breast lipoma 07/01/2017   Complication of anesthesia    Essential tremor 09/11/2019   Gastric erosion    Generalized anxiety disorder 09/11/2019   GERD (gastroesophageal reflux disease)    Headache, tension type, episodic    Excedrin tension headache helps   Insomnia 09/15/2020   Major depressive disorder 09/11/2019   Mild cognitive impairment 07/28/2022   Obesity (BMI 30.0-34.9) 09/11/2019   Polyp of descending colon    PONV (postoperative nausea and vomiting)    with hysterectomy   Primary hypertension 05/19/2021   Suicide attempt 1992   overdose of sleeping pills   Urge urinary incontinence 09/11/2019    Surgical History:  Past Surgical History:  Procedure Laterality Date   BILATERAL SALPINGOOPHORECTOMY  01/04/2010   COLONOSCOPY WITH PROPOFOL N/A 11/10/2020    Procedure: COLONOSCOPY WITH PROPOFOL;  Surgeon: Toney Reil, MD;  Location: [facility] ENDOSCOPY;  Service: Gastroenterology;  Laterality: N/A;   ESOPHAGOGASTRODUODENOSCOPY N/A 11/10/2020   Procedure: ESOPHAGOGASTRODUODENOSCOPY (EGD);  Surgeon: Toney Reil, MD;  Location: [facility];  Service: Gastroenterology;  Laterality: N/A;   HEEL SPUR EXCISION Left 01/05/2007   LIPOMA EXCISION Left 07/13/2017   Procedure: EXCISION LIPOMA-LEFT BREAST;  Surgeon: Earline Mayotte, MD;  Location: [facility] ORS;  Service: General;  Laterality: Left;   SUBMANDIBULAR GLAND EXCISION Right 06/10/2021   Procedure: EXCISION SUBMANDIBULAR GLAND;  Surgeon: Geanie Logan, MD;  Location: [facility] ORS;  Service: ENT;  Laterality: Right;   TOTAL ABDOMINAL HYSTERECTOMY W/ BILATERAL SALPINGOOPHORECTOMY  01/04/2010    Medications:  Current Outpatient Medications on File Prior to Visit  Medication Sig   busPIRone (BUSPAR) 5 MG tablet Take 1 tablet by mouth at bedtime.   escitalopram (LEXAPRO) 20 MG tablet Take 1 tablet (20 mg total) by mouth daily.   gabapentin (NEURONTIN) 300 MG capsule Take 300 mg by mouth 3 (three) times daily.   mirabegron ER (MYRBETRIQ) 50 MG TB24 tablet Take 1 tablet (50 mg total) by mouth daily.   omeprazole (PRILOSEC) 20 MG capsule Take 1 capsule (20 mg total) by mouth 2 (two) times daily before a meal. Take 1 cap by mouth twice daily   No current facility-administered medications on file prior to visit.    Allergies:  Allergies  Allergen Reactions   Vicodin [Hydrocodone-Acetaminophen] Nausea And Vomiting    Social History:  Social History   Socioeconomic History   Marital status: Widowed    Spouse name: Not on file   Number of children: 2   Years of education: 10   Highest education level: 10th grade  Occupational History   Occupation: Disability  Tobacco Use   Smoking status: Never    Passive exposure: Never   Smokeless tobacco: Never  Vaping Use   Vaping status: Never  Used  Substance and Sexual Activity   Alcohol use: Never   Drug use: Never   Sexual activity: Not Currently    Birth control/protection: Surgical  Other Topics Concern   Not on file  Social History Narrative   Right handed   Disability    Social Determinants of Health   Financial Resource Strain: High Risk (10/28/2022)   Overall Financial Resource Strain (CARDIA)    Difficulty of Paying Living Expenses: Hard  Food Insecurity: No Food Insecurity (10/28/2022)   Hunger Vital Sign    Worried About Running Out of Food in the Last Year: Never true    Ran Out of Food in the Last Year: Never  true  Transportation Needs: No Transportation Needs (10/28/2022)   PRAPARE - Administrator, Civil Service (Medical): No    Lack of Transportation (Non-Medical): No  Physical Activity: Sufficiently Active (10/28/2022)   Exercise Vital Sign    Days of Exercise per Week: 7 days    Minutes of Exercise per Session: 60 min  Stress: Stress Concern Present (10/28/2022)   [employer] of Occupational Health - Occupational Stress Questionnaire    Feeling of Stress : To some extent  Social Connections: Moderately Isolated (10/28/2022)   Social Connection and Isolation Panel [NHANES]    Frequency of Communication with Friends and Family: More than three times a week    Frequency of Social Gatherings with Friends and Family: Once a week    Attends Religious Services: More than 4 times per year    Active Member of [employer] or Organizations: No    Attends Banker Meetings: Never    Marital Status: Widowed  Intimate Partner Violence: Not At Risk (10/28/2022)   Humiliation, Afraid, Rape, and Kick questionnaire    Fear of Current or Ex-Partner: No    Emotionally Abused: No    Physically Abused: No    Sexually Abused: No   Social History   Tobacco Use  Smoking Status Never   Passive exposure: Never  Smokeless Tobacco Never   Social History   Substance and Sexual Activity   Alcohol Use Never    Family History:  Family History  Problem Relation Age of Onset   Heart disease Father 33   Tremor Father    Treacher Collins syndrome Father    Cancer Brother        throat and brain cancer   Heart disease Brother        deceased from MI   Tremor Brother    Cancer Brother        liver   Arthritis Paternal Grandmother    Heart disease Paternal Grandmother    Hypertension Paternal Grandmother    Heart disease Paternal Grandfather    Stroke Paternal Grandfather    Hypertension Paternal Grandfather    Cerebral aneurysm Son    Alcoholism Son    Healthy Son    Tremor Cousin    Tremor Paternal Aunt    Breast cancer Neg Hx     Past medical history, surgical history, medications, allergies, family history and social history reviewed with patient today and changes made to appropriate areas of the chart.   Review of Systems  Eyes:  Negative for blurred vision and double vision.  Respiratory:  Negative for shortness of breath.   Cardiovascular:  Negative for chest pain, palpitations and leg swelling.  Neurological:  Positive for tremors. Negative for dizziness and headaches.  Psychiatric/Behavioral:  Positive for depression. Negative for suicidal ideas. The patient is nervous/anxious.     All other ROS negative except what is listed above and in the HPI.      Objective:    BP 130/84   Pulse 71   Temp 98.8 F (37.1 C) (Oral)   Ht 5' 3.4" (1.61 m)   Wt 188 lb 12.8 oz (85.6 kg)   SpO2 96%   BMI 33.02 kg/m   Wt Readings from Last 3 Encounters:  10/28/22 188 lb 12.8 oz (85.6 kg)  10/12/22 191 lb (86.6 kg)  08/24/22 189 lb 9.6 oz (86 kg)    No results found.  Physical Exam Vitals and nursing note reviewed.  Constitutional:  General: She is awake. She is not in acute distress.    Appearance: Normal appearance. She is well-developed. She is not ill-appearing.  HENT:     Head: Normocephalic and atraumatic.     Right Ear: Hearing, tympanic  membrane, ear canal and external ear normal. No drainage.     Left Ear: Hearing, tympanic membrane, ear canal and external ear normal. No drainage.     Nose: Nose normal.     Right Sinus: No maxillary sinus tenderness or frontal sinus tenderness.     Left Sinus: No maxillary sinus tenderness or frontal sinus tenderness.     Mouth/Throat:     Mouth: Mucous membranes are moist.     Pharynx: Oropharynx is clear. Uvula midline. No pharyngeal swelling, oropharyngeal exudate or posterior oropharyngeal erythema.  Eyes:     General: Lids are normal.        Right eye: No discharge.        Left eye: No discharge.     Extraocular Movements: Extraocular movements intact.     Conjunctiva/sclera: Conjunctivae normal.     Pupils: Pupils are equal, round, and reactive to light.     Visual Fields: Right eye visual fields normal and left eye visual fields normal.  Neck:     Thyroid: No thyromegaly.     Vascular: No carotid bruit.     Trachea: Trachea normal.  Cardiovascular:     Rate and Rhythm: Normal rate and regular rhythm.     Heart sounds: Normal heart sounds. No murmur heard.    No gallop.  Pulmonary:     Effort: Pulmonary effort is normal. No accessory muscle usage or respiratory distress.     Breath sounds: Normal breath sounds.  Chest:  Breasts:    Right: Normal.     Left: Normal.  Abdominal:     General: Bowel sounds are normal.     Palpations: Abdomen is soft. There is no hepatomegaly or splenomegaly.     Tenderness: There is no abdominal tenderness.  Musculoskeletal:        General: Normal range of motion.     Cervical back: Normal range of motion and neck supple.     Right lower leg: No edema.     Left lower leg: No edema.  Lymphadenopathy:     Head:     Right side of head: No submental, submandibular, tonsillar, preauricular or posterior auricular adenopathy.     Left side of head: No submental, submandibular, tonsillar, preauricular or posterior auricular adenopathy.      Cervical: No cervical adenopathy.     Upper Body:     Right upper body: No supraclavicular, axillary or pectoral adenopathy.     Left upper body: No supraclavicular, axillary or pectoral adenopathy.  Skin:    General: Skin is warm and dry.     Capillary Refill: Capillary refill takes less than 2 seconds.     Findings: No rash.  Neurological:     Mental Status: She is alert and oriented to person, place, and time.     Motor: Tremor present.     Gait: Gait is intact.  Psychiatric:        Attention and Perception: Attention normal.        Mood and Affect: Mood normal.        Speech: Speech normal.        Behavior: Behavior normal. Behavior is cooperative.        Thought Content: Thought content normal.  Judgment: Judgment normal.        10/28/2022    9:51 AM  6CIT Screen  What Year? 0 points  What month? 0 points  What time? 0 points  Count back from 20 0 points  Months in reverse 0 points  Repeat phrase 2 points  Total Score 2 points     Results for orders placed or performed in visit on 08/23/22  Microscopic Examination   Urine  Result Value Ref Range   WBC, UA >30 (A) 0 - 5 /hpf   RBC, Urine 3-10 (A) 0 - 2 /hpf   Epithelial Cells (non renal) >10 (A) 0 - 10 /hpf   Renal Epithel, UA 0-10 (A) None seen /hpf   Bacteria, UA Many (A) None seen/Few  Urinalysis, Complete  Result Value Ref Range   Specific Gravity, UA 1.010 1.005 - 1.030   pH, UA 6.0 5.0 - 7.5   Color, UA Yellow Yellow   Appearance Ur Hazy (A) Clear   Leukocytes,UA 3+ (A) Negative   Protein,UA Negative Negative/Trace   Glucose, UA Negative Negative   Ketones, UA Negative Negative   RBC, UA Trace (A) Negative   Bilirubin, UA Negative Negative   Urobilinogen, Ur 0.2 0.2 - 1.0 mg/dL   Nitrite, UA Negative Negative   Microscopic Examination See below:       Assessment & Plan:   Problem List Items Addressed This Visit       Cardiovascular and Mediastinum   Primary hypertension    Chronic.   Controlled.  Continue with current medication regimen of Valsartan 40mg  daily.  Refills sent today.  Labs ordered today.  Return to clinic in 6 months for reevaluation.  Call sooner if concerns arise.        Relevant Medications   valsartan (DIOVAN) 40 MG tablet     Nervous and Auditory   Essential tremor    Following up with Dr. Arbutus Leas.  Advised patient decrease caffeine use to see if it improves symptoms.  Continue to collaborate with specialist.        Other   Generalized anxiety disorder    Chronic.  Ongoing.  Continue with current medication regimen of Lexapro and Buspar.  She is now seeing Dr. Vanetta Shawl for management. Return to clinic in 6 months for reevaluation.  Call sooner if concerns arise.       Relevant Medications   busPIRone (BUSPAR) 5 MG tablet   Major depressive disorder    Chronic.  Ongoing.  Continue with current medication regimen of Lexapro and Buspar.  She is now seeing Dr. Vanetta Shawl for management. Return to clinic in 6 months for reevaluation.  Call sooner if concerns arise.       Relevant Medications   busPIRone (BUSPAR) 5 MG tablet   Obesity (BMI 30.0-34.9)    Recommended eating smaller high protein, low fat meals more frequently and exercising 30 mins a day 5 times a week with a goal of 10-15lb weight loss in the next 3 months. Discussed cutting out sodas and sweet tea.       Advanced care planning/counseling discussion    A voluntary discussion about advance care planning including the explanation and discussion of advance directives was extensively discussed  with the patient for 5 minutes with patient.  Explanation about the health care proxy and Living will was reviewed and packet with forms with explanation of how to fill them out was given.  During this discussion, the patient was able to identify a  health care proxy as her mom and plans to fill out the paperwork required.  Patient was offered a separate Advance Care Planning visit for further assistance with  forms.         Other Visit Diagnoses     Encounter for annual wellness exam in Medicare patient    -  Primary   Annual physical exam       Health maintenance reviewd during visit today. Labs ordered.  Vaccines up to date.  Mammogram and Colonoscopy up to date.   Relevant Orders   CBC with Differential/Platelet   Comprehensive metabolic panel   Lipid panel   TSH   Urinalysis, Routine w reflex microscopic   Screening for ischemic heart disease       Relevant Orders   Lipid panel   Need for influenza vaccination       Relevant Orders   Flu vaccine trivalent PF, 6mos and older(Flulaval,Afluria,Fluarix,Fluzone) (Completed)        Preventative Services:  AAA screening: NA Health Risk Assessment and Personalized Prevention Plan: Up to date Bone Mass Measurements: NA Breast Cancer Screening: Up to date CVD Screening: Up to date Cervical Cancer Screening: Up to date Colon Cancer Screening: Up to date Depression Screening: Up to date Diabetes Screening: Done today Glaucoma Screening: Up to date Hepatitis B vaccine: NA Hepatitis C screening: Up to date HIV Screening: Up to date Flu Vaccine: Up to date Lung cancer Screening: NA Obesity Screening: Up to date Pneumonia Vaccines (2): NA STI Screening: NA  Follow up plan: Return in about 6 months (around 04/28/2023) for HTN, HLD, DM2 FU.   LABORATORY TESTING:  - Pap smear: not applicable  IMMUNIZATIONS:   - Tdap: Tetanus vaccination status reviewed: last tetanus booster within 10 years. - Influenza: Administered today - Pneumovax: Not applicable - Prevnar: Not applicable - Zostavax vaccine: Up to date  SCREENING: -Mammogram: Up to date  - Colonoscopy: Up to date  - Bone Density: Not applicable  -Hearing Test: Not applicable  -Spirometry: Not applicable   PATIENT COUNSELING:   Advised to take 1 mg of folate supplement per day if capable of pregnancy.   Sexuality: Discussed sexually transmitted diseases, partner  selection, use of condoms, avoidance of unintended pregnancy  and contraceptive alternatives.   Advised to avoid cigarette smoking.  I discussed with the patient that most people either abstain from alcohol or drink within safe limits (<=14/week and <=4 drinks/occasion for males, <=7/weeks and <= 3 drinks/occasion for females) and that the risk for alcohol disorders and other health effects rises proportionally with the number of drinks per week and how often a drinker exceeds daily limits.  Discussed cessation/primary prevention of drug use and availability of treatment for abuse.   Diet: Encouraged to adjust caloric intake to maintain  or achieve ideal body weight, to reduce intake of dietary saturated fat and total fat, to limit sodium intake by avoiding high sodium foods and not adding table salt, and to maintain adequate dietary potassium and calcium preferably from fresh fruits, vegetables, and low-fat dairy products.    stressed the importance of regular exercise  Injury prevention: Discussed safety belts, safety helmets, smoke detector, smoking near bedding or upholstery.   Dental health: Discussed importance of regular tooth brushing, flossing, and dental visits.    NEXT PREVENTATIVE PHYSICAL DUE IN 1 YEAR. Return in about 6 months (around 04/28/2023) for HTN, HLD, DM2 FU.

## 2022-10-28 NOTE — Assessment & Plan Note (Signed)
Chronic.  Controlled.  Continue with current medication regimen of Valsartan 40mg daily.  Refills sent today.  Labs ordered today.  Return to clinic in 6 months for reevaluation.  Call sooner if concerns arise.   

## 2022-10-28 NOTE — Assessment & Plan Note (Signed)
Chronic.  Ongoing.  Continue with current medication regimen of Lexapro and Buspar.  She is now seeing Dr. Vanetta Shawl for management. Return to clinic in 6 months for reevaluation.  Call sooner if concerns arise.

## 2022-10-28 NOTE — Patient Instructions (Signed)
  Cynthia Sellers , Thank you for taking time to come for your Medicare Wellness Visit. I appreciate your ongoing commitment to your health goals. Please review the following plan we discussed and let me know if I can assist you in the future.   These are the goals we discussed:  Goals   None     This is a list of the screening recommended for you and due dates:  Health Maintenance  Topic Date Due   COVID-19 Vaccine (3 - Pfizer risk series) 11/13/2022*   Mammogram  09/17/2023   Medicare Annual Wellness Visit  10/28/2023   Colon Cancer Screening  11/11/2023   DTaP/Tdap/Td vaccine (2 - Td or Tdap) 09/16/2030   Flu Shot  Completed   Hepatitis C Screening  Completed   HIV Screening  Completed   Zoster (Shingles) Vaccine  Completed   HPV Vaccine  Aged Out  *Topic was postponed. The date shown is not the original due date.

## 2022-10-29 LAB — CBC WITH DIFFERENTIAL/PLATELET
Basophils Absolute: 0 10*3/uL (ref 0.0–0.2)
Basos: 0 %
EOS (ABSOLUTE): 0.1 10*3/uL (ref 0.0–0.4)
Eos: 1 %
Hematocrit: 42.7 % (ref 34.0–46.6)
Hemoglobin: 14.1 g/dL (ref 11.1–15.9)
Immature Grans (Abs): 0 10*3/uL (ref 0.0–0.1)
Immature Granulocytes: 0 %
Lymphocytes Absolute: 1.3 10*3/uL (ref 0.7–3.1)
Lymphs: 29 %
MCH: 31.1 pg (ref 26.6–33.0)
MCHC: 33 g/dL (ref 31.5–35.7)
MCV: 94 fL (ref 79–97)
Monocytes Absolute: 0.4 10*3/uL (ref 0.1–0.9)
Monocytes: 8 %
Neutrophils Absolute: 2.8 10*3/uL (ref 1.4–7.0)
Neutrophils: 62 %
Platelets: 254 10*3/uL (ref 150–450)
RBC: 4.53 x10E6/uL (ref 3.77–5.28)
RDW: 12 % (ref 11.7–15.4)
WBC: 4.5 10*3/uL (ref 3.4–10.8)

## 2022-10-29 LAB — LIPID PANEL
Chol/HDL Ratio: 3.2 ratio (ref 0.0–4.4)
Cholesterol, Total: 229 mg/dL — ABNORMAL HIGH (ref 100–199)
HDL: 72 mg/dL (ref >39)
LDL Chol Calc (NIH): 139 mg/dL — ABNORMAL HIGH (ref 0–99)
Triglycerides: 104 mg/dL (ref 0–149)
VLDL Cholesterol Cal: 18 mg/dL (ref 5–40)

## 2022-10-29 LAB — COMPREHENSIVE METABOLIC PANEL
ALT: 8 [IU]/L (ref 0–32)
AST: 15 [IU]/L (ref 0–40)
Albumin: 4.3 g/dL (ref 3.8–4.9)
Alkaline Phosphatase: 108 [IU]/L (ref 44–121)
BUN/Creatinine Ratio: 13 (ref 9–23)
BUN: 9 mg/dL (ref 6–24)
Bilirubin Total: 0.3 mg/dL (ref 0.0–1.2)
CO2: 22 mmol/L (ref 20–29)
Calcium: 8.8 mg/dL (ref 8.7–10.2)
Chloride: 105 mmol/L (ref 96–106)
Creatinine, Ser: 0.7 mg/dL (ref 0.57–1.00)
Globulin, Total: 2.2 g/dL (ref 1.5–4.5)
Glucose: 65 mg/dL — ABNORMAL LOW (ref 70–99)
Potassium: 3.3 mmol/L — ABNORMAL LOW (ref 3.5–5.2)
Sodium: 143 mmol/L (ref 134–144)
Total Protein: 6.5 g/dL (ref 6.0–8.5)
eGFR: 100 mL/min/{1.73_m2} (ref >59)

## 2022-10-29 LAB — TSH: TSH: 1.07 u[IU]/mL (ref 0.450–4.500)

## 2022-11-11 ENCOUNTER — Other Ambulatory Visit: Payer: Self-pay | Admitting: Nurse Practitioner

## 2022-11-11 DIAGNOSIS — Z1231 Encounter for screening mammogram for malignant neoplasm of breast: Secondary | ICD-10-CM

## 2022-11-17 ENCOUNTER — Ambulatory Visit: Payer: Self-pay | Admitting: *Deleted

## 2022-11-17 NOTE — Progress Notes (Unsigned)
There were no vitals taken for this visit.   Subjective:    Patient ID: Cynthia Sellers, female    DOB: 1965-04-04, 57 y.o.   MRN: [id number]  HPI: Cynthia Sellers is a 57 y.o. female  No chief complaint on file.  HEADACHES Duration: {Blank single:19197::"chronic","days","weeks","months","years"} Onset: {Blank single:19197::"sudden","gradual"} Severity: {Blank single:19197::"mild","moderate","severe","1/10","2/10","3/10","4/10","5/10","6/10","7/10","8/10","9/10","10/10"} Quality: {Blank multiple:19196::"sharp","dull","aching","burning","cramping","ill-defined","itchy","pressure-like","pulling","shooting","sore","stabbing","tender","tearing","throbbing"} Frequency: {Blank single:19197::"constant","intermittent","occasional","rare","every few minutes","a few times a hour","a few times a day","a few times a week","a few times a month","a few times a year"} Location:  Headache duration: Radiation: {Blank single:19197::"yes","no"} Time of day headache occurs:  Alleviating factors:  Aggravating factors:  Headache status at time of visit: {Blank single:19197::"current headache","asymptomatic"} Treatments attempted: Treatments attempted: {Blank multiple:19196::"none","rest","ice","heat","APAP","ibuprofen","aleve", excedrine","triptans","propranolol","topamax","amitriptyline"}   Aura: {Blank single:19197::"yes","no"} Nausea:  {Blank single:19197::"yes","no"} Vomiting: {Blank single:19197::"yes","no"} Photophobia:  {Blank single:19197::"yes","no"} Phonophobia:  {Blank single:19197::"yes","no"} Effect on social functioning:  {Blank single:19197::"yes","no"} Numbers of missed days of school/work each month:  Confusion:  {Blank single:19197::"yes","no"} Gait disturbance/ataxia:  {Blank single:19197::"yes","no"} Behavioral changes:  {Blank single:19197::"yes","no"} Fevers:  {Blank single:19197::"yes","no"}  Relevant past medical, surgical, family and social history reviewed and  updated as indicated. Interim medical history since our last visit reviewed. Allergies and medications reviewed and updated.  Review of Systems  Per HPI unless specifically indicated above     Objective:    There were no vitals taken for this visit.  Wt Readings from Last 3 Encounters:  10/28/22 188 lb 12.8 oz (85.6 kg)  08/23/22 190 lb 4 oz (86.3 kg)  08/02/22 189 lb (85.7 kg)    Physical Exam  Results for orders placed or performed in visit on 10/28/22  Microscopic Examination   Urine  Result Value Ref Range   WBC, UA 6-10 (A) 0 - 5 /hpf   RBC, Urine 0-2 0 - 2 /hpf   Epithelial Cells (non renal) 0-10 0 - 10 /hpf   Mucus, UA Present (A) Not Estab.   Bacteria, UA None seen None seen/Few  CBC with Differential/Platelet  Result Value Ref Range   WBC 4.5 3.4 - 10.8 x10E3/uL   RBC 4.53 3.77 - 5.28 x10E6/uL   Hemoglobin 14.1 11.1 - 15.9 g/dL   Hematocrit 78.2 95.6 - 46.6 %   MCV 94 79 - 97 fL   MCH 31.1 26.6 - 33.0 pg   MCHC 33.0 31.5 - 35.7 g/dL   RDW 21.3 08.6 - 57.8 %   Platelets 254 150 - 450 x10E3/uL   Neutrophils 62 Not Estab. %   Lymphs 29 Not Estab. %   Monocytes 8 Not Estab. %   Eos 1 Not Estab. %   Basos 0 Not Estab. %   Neutrophils Absolute 2.8 1.4 - 7.0 x10E3/uL   Lymphocytes Absolute 1.3 0.7 - 3.1 x10E3/uL   Monocytes Absolute 0.4 0.1 - 0.9 x10E3/uL   EOS (ABSOLUTE) 0.1 0.0 - 0.4 x10E3/uL   Basophils Absolute 0.0 0.0 - 0.2 x10E3/uL   Immature Granulocytes 0 Not Estab. %   Immature Grans (Abs) 0.0 0.0 - 0.1 x10E3/uL  Comprehensive metabolic panel  Result Value Ref Range   Glucose 65 (L) 70 - 99 mg/dL   BUN 9 6 - 24 mg/dL   Creatinine, Ser 4.69 0.57 - 1.00 mg/dL   eGFR 629 >52 WU/XLK/4.40   BUN/Creatinine Ratio 13 9 - 23   Sodium 143 134 - 144 mmol/L   Potassium 3.3 (L) 3.5 - 5.2 mmol/L   Chloride 105 96 - 106 mmol/L   CO2 22 20 - 29 mmol/L   Calcium 8.8 8.7 -  10.2 mg/dL   Total Protein 6.5 6.0 - 8.5 g/dL   Albumin 4.3 3.8 - 4.9 g/dL   Globulin,  Total 2.2 1.5 - 4.5 g/dL   Bilirubin Total 0.3 0.0 - 1.2 mg/dL   Alkaline Phosphatase 108 44 - 121 IU/L   AST 15 0 - 40 IU/L   ALT 8 0 - 32 IU/L  Lipid panel  Result Value Ref Range   Cholesterol, Total 229 (H) 100 - 199 mg/dL   Triglycerides 161 0 - 149 mg/dL   HDL 72 >09 mg/dL   VLDL Cholesterol Cal 18 5 - 40 mg/dL   LDL Chol Calc (NIH) 604 (H) 0 - 99 mg/dL   Chol/HDL Ratio 3.2 0.0 - 4.4 ratio  TSH  Result Value Ref Range   TSH 1.070 0.450 - 4.500 uIU/mL  Urinalysis, Routine w reflex microscopic  Result Value Ref Range   Specific Gravity, UA 1.020 1.005 - 1.030   pH, UA 6.0 5.0 - 7.5   Color, UA Yellow Yellow   Appearance Ur Cloudy (A) Clear   Leukocytes,UA 1+ (A) Negative   Protein,UA Negative Negative/Trace   Glucose, UA Negative Negative   Ketones, UA Negative Negative   RBC, UA Trace (A) Negative   Bilirubin, UA Negative Negative   Urobilinogen, Ur 1.0 0.2 - 1.0 mg/dL   Nitrite, UA Negative Negative   Microscopic Examination See below:       Assessment & Plan:   Problem List Items Addressed This Visit   None    Follow up plan: No follow-ups on file.

## 2022-11-17 NOTE — Telephone Encounter (Signed)
Summary: headache/migraine   Patient's mother, Dois Davenport, has called, stating the patient is having terrible headaches (a lot lately), has turned into migraines, and has not slept any.  Patient's mother is not with the patient and states patient is seeking clinical advice on what to do about these headaches.  Patient's mother states no other symptoms.  Patients callback # [phone] (Kim's phone number)  Patient's mother states she also states she took a Sudafed this morning, patient's mother states she has had tremors for years and years and will be having surgery sometime next year for tremors in her hands and not sure if Sudafed was good to take with those tremors. Patient's mother states the tremors is not related to the headaches that she has had this for years that is hereditary.     Reason for Disposition  [1] SEVERE headache (e.g., excruciating) AND [2] not improved after 2 hours of pain medicine  Answer Assessment - Initial Assessment Questions 1. LOCATION: "Where does it hurt?"      Left eye- forehead 2. ONSET: "When did the headache start?" (Minutes, hours or days)      1 month- wakes with headache- all day 3. PATTERN: "Does the pain come and go, or has it been constant since it started?"     Eases up- but never goes away- trouble sleeping 4. SEVERITY: "How bad is the pain?" and "What does it keep you from doing?"  (e.g., Scale 1-10; mild, moderate, or severe)   - MILD (1-3): doesn't interfere with normal activities    - MODERATE (4-7): interferes with normal activities or awakens from sleep    - SEVERE (8-10): excruciating pain, unable to do any normal activities        Severe 8/10 5. RECURRENT SYMPTOM: "Have you ever had headaches before?" If Yes, ask: "When was the last time?" and "What happened that time?"      Yes- hx migraines- this is different 6. CAUSE: "What do you think is causing the headache?"     Unsure- stress 7. MIGRAINE: "Have you been diagnosed with migraine  headaches?" If Yes, ask: "Is this headache similar?"      Yes- no 8. HEAD INJURY: "Has there been any recent injury to the head?"      no 9. OTHER SYMPTOMS: "Do you have any other symptoms?" (fever, stiff neck, eye pain, sore throat, cold symptoms)     no  Protocols used: Headache-A-AH

## 2022-11-17 NOTE — Telephone Encounter (Signed)
  Chief Complaint: headache- daily Symptoms: daily headache- forehead/left eye, wakes with headache daily, hx migraine as teen Frequency: 1 month Pertinent Negatives: Patient denies fever, stiff neck, eye pain, sore throat, cold symptoms  Disposition: [] ED /[] Urgent Care (no appt availability in office) / [x] Appointment(In office/virtual)/ []  [facility]/ [] Home Care/ [] Refused Recommended Disposition /[] [facility]/ []  Follow-up with PCP Additional Notes: Patient states Cynthia Sellers has hx migraine as teen- Cynthia Sellers has been waking every day with headache in forehead for 1 month- Cynthia Sellers can not get it to go away. Patient states it could be stress- Cynthia Sellers states it is not like her migraine Cynthia Sellers has as teen. Offered appointment today- but Cynthia Sellers wants to come in am- advised if Cynthia Sellers gets worse be seen immediately- Cynthia Sellers states understanding.

## 2022-11-18 ENCOUNTER — Other Ambulatory Visit: Payer: Self-pay | Admitting: Nurse Practitioner

## 2022-11-18 ENCOUNTER — Ambulatory Visit (INDEPENDENT_AMBULATORY_CARE_PROVIDER_SITE_OTHER): Payer: PPO | Admitting: Nurse Practitioner

## 2022-11-18 ENCOUNTER — Encounter: Payer: Self-pay | Admitting: Nurse Practitioner

## 2022-11-18 VITALS — BP 128/84 | HR 74 | Temp 97.8°F | Ht 64.0 in | Wt 188.0 lb

## 2022-11-18 DIAGNOSIS — R519 Headache, unspecified: Secondary | ICD-10-CM

## 2022-11-18 DIAGNOSIS — G8929 Other chronic pain: Secondary | ICD-10-CM

## 2022-11-18 MED ORDER — KETOROLAC TROMETHAMINE 60 MG/2ML IM SOLN
60.0000 mg | Freq: Once | INTRAMUSCULAR | Status: AC
  Administered 2022-11-18: 60 mg via INTRAMUSCULAR

## 2022-11-18 MED ORDER — ONDANSETRON HCL 4 MG PO TABS: 4.0000 mg | ORAL_TABLET | Freq: Three times a day (TID) | ORAL | 0 refills | Status: DC | PRN

## 2022-11-18 MED ORDER — TOPIRAMATE 25 MG PO TABS: 25.0000 mg | ORAL_TABLET | Freq: Every day | ORAL | 0 refills | Status: DC

## 2022-11-18 NOTE — Telephone Encounter (Signed)
Requested medications are due for refill today.  No  Requested medications are on the active medications list.  yes  Last refill. 11/18/2022 #20 0 rf  Future visit scheduled.   yes  Notes to clinic.  Note from pharmacy:Pharmacy comment: not covered its possible te ODT version will be if you can resend.    Requested Prescriptions  Pending Prescriptions Disp Refills   ondansetron (ZOFRAN) 4 MG tablet [Pharmacy Med Name: ONDANSETRON 4MG      TAB] 20 tablet 0    Sig: TAKE 1 TABLET BY MOUTH EVERY 8 HOURS AS NEEDED FOR NAUSEA FOR VOMITING     Not Delegated - Gastroenterology: Antiemetics - ondansetron Failed - 11/18/2022 12:17 PM      Failed - This refill cannot be delegated      Passed - AST in normal range and within 360 days    AST  Date Value Ref Range Status  10/28/2022 15 0 - 40 IU/L Final   SGOT(AST)  Date Value Ref Range Status  01/31/2012 18 15 - 37 Unit/L Final         Passed - ALT in normal range and within 360 days    ALT  Date Value Ref Range Status  10/28/2022 8 0 - 32 IU/L Final   SGPT (ALT)  Date Value Ref Range Status  01/31/2012 16 12 - 78 U/L Final         Passed - Valid encounter within last 6 months    Recent Outpatient Visits           Today Chronic nonintractable headache, unspecified headache type   [facility] Larae Grooms, NP   3 weeks ago Encounter for annual wellness exam in Medicare patient   [facility] Larae Grooms, NP   3 months ago Left ear pain   [facility] Peebles, Megan P, DO   6 months ago Primary hypertension   [facility] Larae Grooms, NP   8 months ago Insomnia, unspecified type   [facility] Larae Grooms, NP       Future Appointments             In 1 week Larae Grooms, NP [facility]   In 5 months Larae Grooms, NP  [facility]   In 9 months MacDiarmid, Lorin Picket, MD [facility]

## 2022-11-24 ENCOUNTER — Ambulatory Visit
Admission: RE | Admit: 2022-11-24 | Discharge: 2022-11-24 | Disposition: A | Discharge status: Home / Self Care | Payer: PPO | Source: Ambulatory Visit | Attending: Nurse Practitioner | Admitting: Nurse Practitioner

## 2022-11-24 DIAGNOSIS — Z1231 Encounter for screening mammogram for malignant neoplasm of breast: Secondary | ICD-10-CM | POA: Insufficient documentation

## 2022-11-25 ENCOUNTER — Encounter: Payer: Self-pay | Admitting: Nurse Practitioner

## 2022-11-25 ENCOUNTER — Ambulatory Visit (INDEPENDENT_AMBULATORY_CARE_PROVIDER_SITE_OTHER): Payer: PPO | Admitting: Nurse Practitioner

## 2022-11-25 VITALS — BP 96/62 | HR 77 | Temp 98.2°F | Ht 64.0 in | Wt 189.2 lb

## 2022-11-25 DIAGNOSIS — G8929 Other chronic pain: Secondary | ICD-10-CM

## 2022-11-25 DIAGNOSIS — G4700 Insomnia, unspecified: Secondary | ICD-10-CM

## 2022-11-25 DIAGNOSIS — R519 Headache, unspecified: Secondary | ICD-10-CM | POA: Diagnosis not present

## 2022-11-25 MED ORDER — TRAZODONE HCL 50 MG PO TABS: 25.0000 mg | ORAL_TABLET | Freq: Every evening | ORAL | 0 refills | Status: DC | PRN

## 2022-11-25 NOTE — Progress Notes (Signed)
BP 96/62 (BP Location: Left Arm, Patient Position: Sitting, Cuff Size: Normal)   Pulse 77   Temp 98.2 F (36.8 C) (Oral)   Ht 5\' 4"  (1.626 m)   Wt 189 lb 3.2 oz (85.8 kg)   SpO2 96%   BMI 32.48 kg/m    Subjective:    Patient ID: Cynthia Sellers, female    DOB: 04-15-1965, 57 y.o.   MRN: [id number]  HPI: PATRICE AVETISYAN is a 57 y.o. female  Chief Complaint  Patient presents with   PHQ-9 12 Week Follow-up   Insomnia   Headache   HEADACHES Patient states she feels a lot better.  She has been drinking caffeine free drinks.  She hasn't had any Bcs in a week.  She is still not sleeping well.  She would like something to help her sleep.  Her Tremor has also improved.  Duration: months Onset: gradual Severity: 8/10 Quality: aching and throbbing Frequency: constant- lately its been constant but usually comes and goes Location: behind the left eye  Headache duration:months Radiation: no Time of day headache occurs: all day Alleviating factors: Bcs- usually taking multiple per day Aggravating factors: lights, noise Headache status at time of visit: current headache Treatments attempted: Treatments attempted:  Bcs, Tylenol and ibuprofen     Aura: no Nausea:  yes Vomiting: yes Photophobia:  yes Phonophobia:  yes Effect on social functioning:  yes Numbers of missed days of school/work each month:  Confusion:  no Gait disturbance/ataxia:  yes Behavioral changes:  no Fevers:  no She drank 3 glasses of tea and 4 cans of coke zero.  One glass of water.   She is having trouble.  She has tried tylenol PM and melatonin.  She is taking Gabapentin 900mg  at bedtime as well as a buspar.  States when she was taking clonazepam which did help her sleep.    Relevant past medical, surgical, family and social history reviewed and updated as indicated. Interim medical history since our last visit reviewed. Allergies and medications reviewed and updated.  Review of Systems   Gastrointestinal:  Positive for nausea and vomiting.  Neurological:  Positive for headaches.    Per HPI unless specifically indicated above     Objective:    BP 96/62 (BP Location: Left Arm, Patient Position: Sitting, Cuff Size: Normal)   Pulse 77   Temp 98.2 F (36.8 C) (Oral)   Ht 5\' 4"  (1.626 m)   Wt 189 lb 3.2 oz (85.8 kg)   SpO2 96%   BMI 32.48 kg/m   Wt Readings from Last 3 Encounters:  11/25/22 189 lb 3.2 oz (85.8 kg)  11/18/22 188 lb (85.3 kg)  10/28/22 188 lb 12.8 oz (85.6 kg)    Physical Exam Vitals and nursing note reviewed.  Constitutional:      General: She is not in acute distress.    Appearance: Normal appearance. She is normal weight. She is not ill-appearing, toxic-appearing or diaphoretic.  HENT:     Head: Normocephalic.     Right Ear: External ear normal.     Left Ear: External ear normal.     Nose: Nose normal.     Mouth/Throat:     Mouth: Mucous membranes are moist.     Pharynx: Oropharynx is clear.  Eyes:     General:        Right eye: No discharge.        Left eye: No discharge.     Extraocular Movements: Extraocular movements  intact.     Conjunctiva/sclera: Conjunctivae normal.     Pupils: Pupils are equal, round, and reactive to light.  Cardiovascular:     Rate and Rhythm: Normal rate and regular rhythm.     Heart sounds: No murmur heard. Pulmonary:     Effort: Pulmonary effort is normal. No respiratory distress.     Breath sounds: Normal breath sounds. No wheezing or rales.  Musculoskeletal:     Cervical back: Normal range of motion and neck supple.  Skin:    General: Skin is warm and dry.     Capillary Refill: Capillary refill takes less than 2 seconds.  Neurological:     General: No focal deficit present.     Mental Status: She is alert and oriented to person, place, and time. Mental status is at baseline.  Psychiatric:        Mood and Affect: Mood normal.        Behavior: Behavior normal.        Thought Content: Thought  content normal.        Judgment: Judgment normal.     Results for orders placed or performed in visit on 10/28/22  Microscopic Examination   Urine  Result Value Ref Range   WBC, UA 6-10 (A) 0 - 5 /hpf   RBC, Urine 0-2 0 - 2 /hpf   Epithelial Cells (non renal) 0-10 0 - 10 /hpf   Mucus, UA Present (A) Not Estab.   Bacteria, UA None seen None seen/Few  CBC with Differential/Platelet  Result Value Ref Range   WBC 4.5 3.4 - 10.8 x10E3/uL   RBC 4.53 3.77 - 5.28 x10E6/uL   Hemoglobin 14.1 11.1 - 15.9 g/dL   Hematocrit 08.6 57.8 - 46.6 %   MCV 94 79 - 97 fL   MCH 31.1 26.6 - 33.0 pg   MCHC 33.0 31.5 - 35.7 g/dL   RDW 46.9 62.9 - 52.8 %   Platelets 254 150 - 450 x10E3/uL   Neutrophils 62 Not Estab. %   Lymphs 29 Not Estab. %   Monocytes 8 Not Estab. %   Eos 1 Not Estab. %   Basos 0 Not Estab. %   Neutrophils Absolute 2.8 1.4 - 7.0 x10E3/uL   Lymphocytes Absolute 1.3 0.7 - 3.1 x10E3/uL   Monocytes Absolute 0.4 0.1 - 0.9 x10E3/uL   EOS (ABSOLUTE) 0.1 0.0 - 0.4 x10E3/uL   Basophils Absolute 0.0 0.0 - 0.2 x10E3/uL   Immature Granulocytes 0 Not Estab. %   Immature Grans (Abs) 0.0 0.0 - 0.1 x10E3/uL  Comprehensive metabolic panel  Result Value Ref Range   Glucose 65 (L) 70 - 99 mg/dL   BUN 9 6 - 24 mg/dL   Creatinine, Ser 4.13 0.57 - 1.00 mg/dL   eGFR 244 >01 UU/VOZ/3.66   BUN/Creatinine Ratio 13 9 - 23   Sodium 143 134 - 144 mmol/L   Potassium 3.3 (L) 3.5 - 5.2 mmol/L   Chloride 105 96 - 106 mmol/L   CO2 22 20 - 29 mmol/L   Calcium 8.8 8.7 - 10.2 mg/dL   Total Protein 6.5 6.0 - 8.5 g/dL   Albumin 4.3 3.8 - 4.9 g/dL   Globulin, Total 2.2 1.5 - 4.5 g/dL   Bilirubin Total 0.3 0.0 - 1.2 mg/dL   Alkaline Phosphatase 108 44 - 121 IU/L   AST 15 0 - 40 IU/L   ALT 8 0 - 32 IU/L  Lipid panel  Result Value Ref Range   Cholesterol, Total  229 (H) 100 - 199 mg/dL   Triglycerides 517 0 - 149 mg/dL   HDL 72 >61 mg/dL   VLDL Cholesterol Cal 18 5 - 40 mg/dL   LDL Chol Calc (NIH) 607 (H)  0 - 99 mg/dL   Chol/HDL Ratio 3.2 0.0 - 4.4 ratio  TSH  Result Value Ref Range   TSH 1.070 0.450 - 4.500 uIU/mL  Urinalysis, Routine w reflex microscopic  Result Value Ref Range   Specific Gravity, UA 1.020 1.005 - 1.030   pH, UA 6.0 5.0 - 7.5   Color, UA Yellow Yellow   Appearance Ur Cloudy (A) Clear   Leukocytes,UA 1+ (A) Negative   Protein,UA Negative Negative/Trace   Glucose, UA Negative Negative   Ketones, UA Negative Negative   RBC, UA Trace (A) Negative   Bilirubin, UA Negative Negative   Urobilinogen, Ur 1.0 0.2 - 1.0 mg/dL   Nitrite, UA Negative Negative   Microscopic Examination See below:       Assessment & Plan:   Problem List Items Addressed This Visit   None Visit Diagnoses     Chronic nonintractable headache, unspecified headache type    -  Primary Ongoing x several months. Patient is consuming close to 400mg  of caffeine daily, suspect headaches are partially related. Lengthy discussion had with patient regarding use of BCs and other caffeine consumpation. Suspect this is also contributing to her inability to sleep.  Do not advise patient take Clonazepam for sleep.  Will start Topamax 25mg  nightly to see if headaches improve.  Side effects and benefits of medication discussed.  Toradol shot given today.  Follow up in 1 week.    Relevant Medications   ketorolac (TORADOL) injection 60 mg (Completed)   topiramate (TOPAMAX) 25 MG tablet        Follow up plan: Return in about 3 weeks (around 12/16/2022) for Sleep follow up.

## 2022-11-25 NOTE — Assessment & Plan Note (Signed)
Chronic.  Will restart Trazodone.  Has not had success with the medication in the past but has significantly reduced her use of caffeine from 500mg  daily down to under 30mg .  Side effects and benefits of medication discussed.  Follow up in 3 weeks.  Call sooner if concerns arise.
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
## 2022-12-16 ENCOUNTER — Ambulatory Visit: Payer: PPO | Admitting: Psychiatry

## 2022-12-16 ENCOUNTER — Encounter: Payer: Self-pay | Admitting: Nurse Practitioner

## 2022-12-16 ENCOUNTER — Ambulatory Visit (INDEPENDENT_AMBULATORY_CARE_PROVIDER_SITE_OTHER): Payer: PPO | Admitting: Nurse Practitioner

## 2022-12-16 VITALS — BP 133/96 | HR 89 | Temp 98.2°F | Ht 63.39 in | Wt 187.6 lb

## 2022-12-16 DIAGNOSIS — G4700 Insomnia, unspecified: Secondary | ICD-10-CM

## 2022-12-16 MED ORDER — VALSARTAN 40 MG PO TABS: 40.0000 mg | ORAL_TABLET | Freq: Every day | ORAL | 1 refills | Status: DC

## 2022-12-16 MED ORDER — MIRTAZAPINE 7.5 MG PO TABS: 7.5000 mg | ORAL_TABLET | Freq: Every day | ORAL | 0 refills | Status: DC

## 2022-12-16 MED ORDER — OMEPRAZOLE 20 MG PO CPDR: 20.0000 mg | DELAYED_RELEASE_CAPSULE | Freq: Two times a day (BID) | ORAL | 1 refills | Status: DC

## 2022-12-16 MED ORDER — ESCITALOPRAM OXALATE 20 MG PO TABS: 20.0000 mg | ORAL_TABLET | Freq: Every day | ORAL | 1 refills | Status: DC

## 2022-12-16 NOTE — Assessment & Plan Note (Signed)
Chronic. Not well controlled.  No longer drinking caffeine.  Trazodone made her hyper.  Will start Mirtazipine 7.5mg  nightly.  Discussed side effects and benefits of medication discussed during visit. Follow up in 1 month.  Call sooner if concerns arise.

## 2022-12-16 NOTE — Progress Notes (Signed)
BP (!) 133/96 (BP Location: Left Arm, Patient Position: Sitting, Cuff Size: Large)   Pulse 89   Temp 98.2 F (36.8 C) (Oral)   Ht 5' 3.39" (1.61 m)   Wt 187 lb 9.6 oz (85.1 kg)   SpO2 96%   BMI 32.83 kg/m    Subjective:    Patient ID: Cynthia Sellers, female    DOB: 10-31-1965, 57 y.o.   MRN: [id number]  HPI: Cynthia Sellers is a 57 y.o. female  Chief Complaint  Patient presents with   3 week follow up   Insomnia    Still not able to sleep     SLEEP She is having trouble sleeping.  States she wasn't able to take the Trazodone.  It made her hyper.  She has been on Seroquel in the past which caused her TD.  Relevant past medical, surgical, family and social history reviewed and updated as indicated. Interim medical history since our last visit reviewed. Allergies and medications reviewed and updated.  Review of Systems  Psychiatric/Behavioral:  Positive for sleep disturbance.     Per HPI unless specifically indicated above     Objective:    BP (!) 133/96 (BP Location: Left Arm, Patient Position: Sitting, Cuff Size: Large)   Pulse 89   Temp 98.2 F (36.8 C) (Oral)   Ht 5' 3.39" (1.61 m)   Wt 187 lb 9.6 oz (85.1 kg)   SpO2 96%   BMI 32.83 kg/m   Wt Readings from Last 3 Encounters:  12/16/22 187 lb 9.6 oz (85.1 kg)  11/25/22 189 lb 3.2 oz (85.8 kg)  11/18/22 188 lb (85.3 kg)    Physical Exam Vitals and nursing note reviewed.  Constitutional:      General: She is not in acute distress.    Appearance: Normal appearance. She is normal weight. She is not ill-appearing, toxic-appearing or diaphoretic.  HENT:     Head: Normocephalic.     Right Ear: External ear normal.     Left Ear: External ear normal.     Nose: Nose normal.     Mouth/Throat:     Mouth: Mucous membranes are moist.     Pharynx: Oropharynx is clear.  Eyes:     General:        Right eye: No discharge.        Left eye: No discharge.     Extraocular Movements: Extraocular movements  intact.     Conjunctiva/sclera: Conjunctivae normal.     Pupils: Pupils are equal, round, and reactive to light.  Cardiovascular:     Rate and Rhythm: Normal rate and regular rhythm.     Heart sounds: No murmur heard. Pulmonary:     Effort: Pulmonary effort is normal. No respiratory distress.     Breath sounds: Normal breath sounds. No wheezing or rales.  Musculoskeletal:     Cervical back: Normal range of motion and neck supple.  Skin:    General: Skin is warm and dry.     Capillary Refill: Capillary refill takes less than 2 seconds.  Neurological:     General: No focal deficit present.     Mental Status: She is alert and oriented to person, place, and time. Mental status is at baseline.  Psychiatric:        Mood and Affect: Mood normal.        Behavior: Behavior normal.        Thought Content: Thought content normal.  Judgment: Judgment normal.     Results for orders placed or performed in visit on 10/28/22  Microscopic Examination   Collection Time: 10/28/22 10:17 AM   Urine  Result Value Ref Range   WBC, UA 6-10 (A) 0 - 5 /hpf   RBC, Urine 0-2 0 - 2 /hpf   Epithelial Cells (non renal) 0-10 0 - 10 /hpf   Mucus, UA Present (A) Not Estab.   Bacteria, UA None seen None seen/Few  Urinalysis, Routine w reflex microscopic   Collection Time: 10/28/22 10:17 AM  Result Value Ref Range   Specific Gravity, UA 1.020 1.005 - 1.030   pH, UA 6.0 5.0 - 7.5   Color, UA Yellow Yellow   Appearance Ur Cloudy (A) Clear   Leukocytes,UA 1+ (A) Negative   Protein,UA Negative Negative/Trace   Glucose, UA Negative Negative   Ketones, UA Negative Negative   RBC, UA Trace (A) Negative   Bilirubin, UA Negative Negative   Urobilinogen, Ur 1.0 0.2 - 1.0 mg/dL   Nitrite, UA Negative Negative   Microscopic Examination See below:   CBC with Differential/Platelet   Collection Time: 10/28/22 10:18 AM  Result Value Ref Range   WBC 4.5 3.4 - 10.8 x10E3/uL   RBC 4.53 3.77 - 5.28 x10E6/uL    Hemoglobin 14.1 11.1 - 15.9 g/dL   Hematocrit 16.1 09.6 - 46.6 %   MCV 94 79 - 97 fL   MCH 31.1 26.6 - 33.0 pg   MCHC 33.0 31.5 - 35.7 g/dL   RDW 04.5 40.9 - 81.1 %   Platelets 254 150 - 450 x10E3/uL   Neutrophils 62 Not Estab. %   Lymphs 29 Not Estab. %   Monocytes 8 Not Estab. %   Eos 1 Not Estab. %   Basos 0 Not Estab. %   Neutrophils Absolute 2.8 1.4 - 7.0 x10E3/uL   Lymphocytes Absolute 1.3 0.7 - 3.1 x10E3/uL   Monocytes Absolute 0.4 0.1 - 0.9 x10E3/uL   EOS (ABSOLUTE) 0.1 0.0 - 0.4 x10E3/uL   Basophils Absolute 0.0 0.0 - 0.2 x10E3/uL   Immature Granulocytes 0 Not Estab. %   Immature Grans (Abs) 0.0 0.0 - 0.1 x10E3/uL  Comprehensive metabolic panel   Collection Time: 10/28/22 10:18 AM  Result Value Ref Range   Glucose 65 (L) 70 - 99 mg/dL   BUN 9 6 - 24 mg/dL   Creatinine, Ser 9.14 0.57 - 1.00 mg/dL   eGFR 782 >95 AO/ZHY/8.65   BUN/Creatinine Ratio 13 9 - 23   Sodium 143 134 - 144 mmol/L   Potassium 3.3 (L) 3.5 - 5.2 mmol/L   Chloride 105 96 - 106 mmol/L   CO2 22 20 - 29 mmol/L   Calcium 8.8 8.7 - 10.2 mg/dL   Total Protein 6.5 6.0 - 8.5 g/dL   Albumin 4.3 3.8 - 4.9 g/dL   Globulin, Total 2.2 1.5 - 4.5 g/dL   Bilirubin Total 0.3 0.0 - 1.2 mg/dL   Alkaline Phosphatase 108 44 - 121 IU/L   AST 15 0 - 40 IU/L   ALT 8 0 - 32 IU/L  Lipid panel   Collection Time: 10/28/22 10:18 AM  Result Value Ref Range   Cholesterol, Total 229 (H) 100 - 199 mg/dL   Triglycerides 784 0 - 149 mg/dL   HDL 72 >69 mg/dL   VLDL Cholesterol Cal 18 5 - 40 mg/dL   LDL Chol Calc (NIH) 629 (H) 0 - 99 mg/dL   Chol/HDL Ratio 3.2 0.0 - 4.4  ratio  TSH   Collection Time: 10/28/22 10:18 AM  Result Value Ref Range   TSH 1.070 0.450 - 4.500 uIU/mL      Assessment & Plan:   Problem List Items Addressed This Visit   None Visit Diagnoses     Chronic nonintractable headache, unspecified headache type    -  Primary Ongoing x several months. Patient is consuming close to 400mg  of caffeine daily,  suspect headaches are partially related. Lengthy discussion had with patient regarding use of BCs and other caffeine consumpation. Suspect this is also contributing to her inability to sleep.  Do not advise patient take Clonazepam for sleep.  Will start Topamax 25mg  nightly to see if headaches improve.  Side effects and benefits of medication discussed.  Toradol shot given today.  Follow up in 1 week.    Relevant Medications   ketorolac (TORADOL) injection 60 mg (Completed)   topiramate (TOPAMAX) 25 MG tablet        Follow up plan: Return in about 1 month (around 01/16/2023) for Sleep Follow up.

## 2022-12-21 ENCOUNTER — Other Ambulatory Visit: Payer: Self-pay | Admitting: Nurse Practitioner

## 2022-12-22 NOTE — Telephone Encounter (Signed)
No longer listed on current medication list Requested Prescriptions  Pending Prescriptions Disp Refills   traZODone (DESYREL) 50 MG tablet [Pharmacy Med Name: traZODone HCl 50 MG Oral Tablet] 30 tablet 0    Sig: TAKE 1/2 TO 1 (ONE-HALF TO ONE) TABLET BY MOUTH AT BEDTIME AS NEEDED FOR SLEEP     Psychiatry: Antidepressants - Serotonin Modulator Passed - 12/22/2022  9:36 AM      Passed - Completed PHQ-2 or PHQ-9 in the last 360 days      Passed - Valid encounter within last 6 months    Recent Outpatient Visits           6 days ago Insomnia, unspecified type   [place] [facility] Larae Grooms, NP   3 weeks ago Insomnia, unspecified type   [place] [facility] Larae Grooms, NP   1 month ago Chronic nonintractable headache, unspecified headache type   [place] [facility] Larae Grooms, NP   1 month ago Encounter for annual wellness exam in Medicare patient   [place] [facility] Larae Grooms, NP   4 months ago Left ear pain   [place] [facility] Dorcas Carrow, DO       Future Appointments             In 3 weeks Larae Grooms, NP [facility]   In 4 months Larae Grooms, NP [facility]   In 7 months MacDiarmid, Lorin Picket, MD [facility]

## 2023-01-11 NOTE — Progress Notes (Signed)
 Assessment/Plan:    1.  Essential Tremor  -I weaned her off of the Abilify .  She was never on Abilify  very long, but continues to have mouth movements that look like tardive dyskinesia.  It is unclear if these were present prior to the start of Abilify .  Nonetheless, tardive dyskinesia certainly can be permanent.  I may give Ingrezza or Austedo a trial in the future for this.  -Patient is off of the primidone .  It is unclear why, but nonetheless patient did not tolerate daytime dosages of it.  -Patient and I, along with her stepmother had a very long discussion regarding her tremor.  She and I discussed focused ultrasound as well as wake and sleep DBS in detail.  She does not want to leave the [facility] health system, in which case awake DBS is her only choice.  However, she is nervous about having a week DBS, given her anxiety.  Her insurance is [employer], which limits her choices, as we do not do focused ultrasound here at [facility].  She really does think she wants focused ultrasound, as wants to have both done at the same time.  She ultimately stated that she wanted bilateral DBS, starting on the left side.  She is right-hand dominant.  We spent an extensive amount of time today discussing risks, benefits, and side effects.  She and her mother asked multiple questions and I answered those to the best of my ability.  We will get her scheduled for preop video.  Following that, we will get her scheduled for consultation with Dr. Carollee and for preop MRI.   Subjective:   Cynthia Sellers was seen today in follow up for essential tremor.  My previous records were reviewed prior to todays visit.  Pt with her (step)mother who supplements hx. patient was not able to tolerate daytime primidone  so I told her to take all of the primidone  200 mg at bedtime.  She doesn't think that she is taking it but doesn't know why.   She had cognitive testing with Dr. Richie on July 24 and he went over this  with her on August 13.  This demonstrated MCI.  Dr. Richie noted that patient should engage in psychotherapy given risk of DBS to clinically worsen depression.  He worried about her anxiety as well.  She saw Dr. Vickey not long after that and Dr. Vickey felt that the tremors were part of the anxiety.  I expressed concern to Dr. Hisada about the fact that the mother was concerned that the patient would not be able to tolerate any surgical intervention, and Dr. Vickey stated that the mother had not been involved with visits with her and she will try to get her involved.  Patient did see Dr. Vickey after that, in October, although she was alone.  She reported her drowsiness was a little bit better with taking Lexapro  at night.  She saw [facility] neurology since last visit.  Notes are reviewed.  She recently saw her primary nurse practitioner and they started her on very low-dose topiramate , 25 mg at bedtime.  This was for headache.  She reports she is off of that for now.   I felt they were caffeine induced.  They also started her on mirtazapine .  Current prescribed movement disorder medications: Primidone , 50 mg, 4 tablets at bed (now off) Current medications prescribed by [facility] neurology: Gabapentin 300 mg, 1 3 times per day Lexapro , 20 mg daily BuSpar  10 mg  daily  ALLERGIES:   Allergies  Allergen Reactions   Vicodin [Hydrocodone -Acetaminophen ] Nausea And Vomiting    CURRENT MEDICATIONS:  Current Meds  Medication Sig   busPIRone  (BUSPAR ) 5 MG tablet Take 1 tablet by mouth at bedtime.   escitalopram  (LEXAPRO ) 20 MG tablet Take 1 tablet (20 mg total) by mouth daily.   gabapentin (NEURONTIN) 300 MG capsule Take 300 mg by mouth 3 (three) times daily.   mirabegron  ER (MYRBETRIQ ) 50 MG TB24 tablet Take 1 tablet (50 mg total) by mouth daily.   mirtazapine  (REMERON ) 7.5 MG tablet Take 1 tablet (7.5 mg total) by mouth at bedtime.   omeprazole  (PRILOSEC ) 20 MG capsule Take 1 capsule (20 mg total) by  mouth 2 (two) times daily before a meal. Take 1 cap by mouth twice daily   ondansetron  (ZOFRAN ) 4 MG tablet Take 1 tablet (4 mg total) by mouth every 8 (eight) hours as needed for nausea or vomiting.   valsartan  (DIOVAN ) 40 MG tablet Take 1 tablet (40 mg total) by mouth daily.      Objective:    PHYSICAL EXAMINATION:    VITALS:   Vitals:   01/13/23 1259  BP: 122/86  Pulse: 77  SpO2: 97%  Weight: 199 lb (90.3 kg)  Height: 5' 4 (1.626 m)     GEN:  The patient appears stated age and is in NAD. HEENT:  Normocephalic, atraumatic.  The mucous membranes are moist. The superficial temporal arteries are without ropiness or tenderness. CV:  RRR Lungs:  CTAB Neck/HEME:  There are no carotid bruits bilaterally.  Neurological examination:  Orientation: The patient is alert and oriented x3.  She is able to provide her history fairly well, but she is confused on her medication. Cranial nerves: There is good facial symmetry. The speech is fluent and clear. Soft palate rises symmetrically and there is no tongue deviation. Hearing is intact to conversational tone. Sensation: Sensation is intact to light touch throughout Motor: Strength is at least antigravity x4.  Movement examination: Tone: There is normal tone in the UE/LE Tremor: There is jaw tremor that is moderate. She does have some tongue movement within the mouth, but not outside of the mouth.  This is similar to last visit.  She has mild tremor of the outstretched hands. She has moderate intention tremor bilaterally.  She has trouble with Archimedes spirals, overall mild to moderate.     Coordination:  There is no decremation with RAM's Gait and Station: The patient has no difficulty arising out of a deep-seated chair without the use of the hands. The patient's stride length is good I have reviewed and interpreted the following labs independently   Chemistry      Component Value Date/Time   NA 143 10/28/2022 1018   NA 138  01/31/2012 1839   K 3.3 (L) 10/28/2022 1018   K 3.6 01/31/2012 1839   CL 105 10/28/2022 1018   CL 107 01/31/2012 1839   CO2 22 10/28/2022 1018   CO2 22 01/31/2012 1839   BUN 9 10/28/2022 1018   BUN 13 01/31/2012 1839   CREATININE 0.70 10/28/2022 1018   CREATININE 0.67 01/31/2012 1839      Component Value Date/Time   CALCIUM 8.8 10/28/2022 1018   CALCIUM 8.6 01/31/2012 1839   ALKPHOS 108 10/28/2022 1018   ALKPHOS 100 01/31/2012 1839   AST 15 10/28/2022 1018   AST 18 01/31/2012 1839   ALT 8 10/28/2022 1018   ALT 16 01/31/2012 1839  BILITOT 0.3 10/28/2022 1018   BILITOT 0.7 01/31/2012 1839      Lab Results  Component Value Date   WBC 4.5 10/28/2022   HGB 14.1 10/28/2022   HCT 42.7 10/28/2022   MCV 94 10/28/2022   PLT 254 10/28/2022   Lab Results  Component Value Date   TSH 1.070 10/28/2022     Chemistry      Component Value Date/Time   NA 143 10/28/2022 1018   NA 138 01/31/2012 1839   K 3.3 (L) 10/28/2022 1018   K 3.6 01/31/2012 1839   CL 105 10/28/2022 1018   CL 107 01/31/2012 1839   CO2 22 10/28/2022 1018   CO2 22 01/31/2012 1839   BUN 9 10/28/2022 1018   BUN 13 01/31/2012 1839   CREATININE 0.70 10/28/2022 1018   CREATININE 0.67 01/31/2012 1839      Component Value Date/Time   CALCIUM 8.8 10/28/2022 1018   CALCIUM 8.6 01/31/2012 1839   ALKPHOS 108 10/28/2022 1018   ALKPHOS 100 01/31/2012 1839   AST 15 10/28/2022 1018   AST 18 01/31/2012 1839   ALT 8 10/28/2022 1018   ALT 16 01/31/2012 1839   BILITOT 0.3 10/28/2022 1018   BILITOT 0.7 01/31/2012 1839         Total time spent on today's visit was 55 minutes, including both face-to-face time and nonface-to-face time.  Time included that spent on review of records (prior notes available to me/labs/imaging if pertinent), discussing treatment and goals, answering patient's questions and coordinating care.  Cc:  Melvin Pao, NP

## 2023-01-13 ENCOUNTER — Encounter: Payer: Self-pay | Admitting: Neurology

## 2023-01-13 ENCOUNTER — Ambulatory Visit: Payer: PPO | Admitting: Neurology

## 2023-01-13 VITALS — BP 122/86 | HR 77 | Ht 64.0 in | Wt 199.0 lb

## 2023-01-13 DIAGNOSIS — F411 Generalized anxiety disorder: Secondary | ICD-10-CM | POA: Diagnosis not present

## 2023-01-13 DIAGNOSIS — G250 Essential tremor: Secondary | ICD-10-CM | POA: Diagnosis not present

## 2023-01-18 ENCOUNTER — Encounter: Payer: Self-pay | Admitting: Nurse Practitioner

## 2023-01-18 ENCOUNTER — Ambulatory Visit (INDEPENDENT_AMBULATORY_CARE_PROVIDER_SITE_OTHER): Payer: PPO | Admitting: Nurse Practitioner

## 2023-01-18 VITALS — BP 128/88 | HR 71 | Temp 98.2°F | Ht 64.0 in | Wt 192.4 lb

## 2023-01-18 DIAGNOSIS — G4700 Insomnia, unspecified: Secondary | ICD-10-CM

## 2023-01-18 MED ORDER — MIRTAZAPINE 15 MG PO TABS: 15.0000 mg | ORAL_TABLET | Freq: Every day | ORAL | 1 refills | Status: DC

## 2023-01-18 NOTE — Progress Notes (Signed)
 BP 128/88 (BP Location: Left Arm, Patient Position: Sitting, Cuff Size: Large)   Pulse 71   Temp 98.2 F (36.8 C) (Oral)   Ht 5' 4 (1.626 m)   Wt 192 lb 6.4 oz (87.3 kg)   SpO2 98%   BMI 33.03 kg/m    Subjective:    Patient ID: Cynthia Sellers, female    DOB: July 29, 1965, 58 y.o.   MRN: [id number]  HPI: Cynthia Sellers is a 58 y.o. female  Chief Complaint  Patient presents with   1 month follow up   Insomnia    SLEEP She is sleeping better however, she is taking 2 tabs.  She would like to increase the dose.  States she wasn't able to take the Trazodone .  It made her hyper.  She has been on Seroquel  in the past which caused her TD.  Relevant past medical, surgical, family and social history reviewed and updated as indicated. Interim medical history since our last visit reviewed. Allergies and medications reviewed and updated.  Review of Systems  Psychiatric/Behavioral:  Positive for sleep disturbance.     Per HPI unless specifically indicated above     Objective:    BP 128/88 (BP Location: Left Arm, Patient Position: Sitting, Cuff Size: Large)   Pulse 71   Temp 98.2 F (36.8 C) (Oral)   Ht 5' 4 (1.626 m)   Wt 192 lb 6.4 oz (87.3 kg)   SpO2 98%   BMI 33.03 kg/m   Wt Readings from Last 3 Encounters:  01/18/23 192 lb 6.4 oz (87.3 kg)  01/13/23 199 lb (90.3 kg)  12/16/22 187 lb 9.6 oz (85.1 kg)    Physical Exam Vitals and nursing note reviewed.  Constitutional:      General: She is not in acute distress.    Appearance: Normal appearance. She is normal weight. She is not ill-appearing, toxic-appearing or diaphoretic.  HENT:     Head: Normocephalic.     Right Ear: External ear normal.     Left Ear: External ear normal.     Nose: Nose normal.     Mouth/Throat:     Mouth: Mucous membranes are moist.     Pharynx: Oropharynx is clear.  Eyes:     General:        Right eye: No discharge.        Left eye: No discharge.     Extraocular Movements:  Extraocular movements intact.     Conjunctiva/sclera: Conjunctivae normal.     Pupils: Pupils are equal, round, and reactive to light.  Cardiovascular:     Rate and Rhythm: Normal rate and regular rhythm.     Heart sounds: No murmur heard. Pulmonary:     Effort: Pulmonary effort is normal. No respiratory distress.     Breath sounds: Normal breath sounds. No wheezing or rales.  Musculoskeletal:     Cervical back: Normal range of motion and neck supple.  Skin:    General: Skin is warm and dry.     Capillary Refill: Capillary refill takes less than 2 seconds.  Neurological:     General: No focal deficit present.     Mental Status: She is alert and oriented to person, place, and time. Mental status is at baseline.  Psychiatric:        Mood and Affect: Mood normal.        Behavior: Behavior normal.        Thought Content: Thought content normal.  Judgment: Judgment normal.     Results for orders placed or performed in visit on 10/28/22  Microscopic Examination   Collection Time: 10/28/22 10:17 AM   Urine  Result Value Ref Range   WBC, UA 6-10 (A) 0 - 5 /hpf   RBC, Urine 0-2 0 - 2 /hpf   Epithelial Cells (non renal) 0-10 0 - 10 /hpf   Mucus, UA Present (A) Not Estab.   Bacteria, UA None seen None seen/Few  Urinalysis, Routine w reflex microscopic   Collection Time: 10/28/22 10:17 AM  Result Value Ref Range   Specific Gravity, UA 1.020 1.005 - 1.030   pH, UA 6.0 5.0 - 7.5   Color, UA Yellow Yellow   Appearance Ur Cloudy (A) Clear   Leukocytes,UA 1+ (A) Negative   Protein,UA Negative Negative/Trace   Glucose, UA Negative Negative   Ketones, UA Negative Negative   RBC, UA Trace (A) Negative   Bilirubin, UA Negative Negative   Urobilinogen, Ur 1.0 0.2 - 1.0 mg/dL   Nitrite, UA Negative Negative   Microscopic Examination See below:   CBC with Differential/Platelet   Collection Time: 10/28/22 10:18 AM  Result Value Ref Range   WBC 4.5 3.4 - 10.8 x10E3/uL   RBC 4.53  3.77 - 5.28 x10E6/uL   Hemoglobin 14.1 11.1 - 15.9 g/dL   Hematocrit 57.2 65.9 - 46.6 %   MCV 94 79 - 97 fL   MCH 31.1 26.6 - 33.0 pg   MCHC 33.0 31.5 - 35.7 g/dL   RDW 87.9 88.2 - 84.5 %   Platelets 254 150 - 450 x10E3/uL   Neutrophils 62 Not Estab. %   Lymphs 29 Not Estab. %   Monocytes 8 Not Estab. %   Eos 1 Not Estab. %   Basos 0 Not Estab. %   Neutrophils Absolute 2.8 1.4 - 7.0 x10E3/uL   Lymphocytes Absolute 1.3 0.7 - 3.1 x10E3/uL   Monocytes Absolute 0.4 0.1 - 0.9 x10E3/uL   EOS (ABSOLUTE) 0.1 0.0 - 0.4 x10E3/uL   Basophils Absolute 0.0 0.0 - 0.2 x10E3/uL   Immature Granulocytes 0 Not Estab. %   Immature Grans (Abs) 0.0 0.0 - 0.1 x10E3/uL  Comprehensive metabolic panel   Collection Time: 10/28/22 10:18 AM  Result Value Ref Range   Glucose 65 (L) 70 - 99 mg/dL   BUN 9 6 - 24 mg/dL   Creatinine, Ser 9.29 0.57 - 1.00 mg/dL   eGFR 899 >40 fO/fpw/8.26   BUN/Creatinine Ratio 13 9 - 23   Sodium 143 134 - 144 mmol/L   Potassium 3.3 (L) 3.5 - 5.2 mmol/L   Chloride 105 96 - 106 mmol/L   CO2 22 20 - 29 mmol/L   Calcium 8.8 8.7 - 10.2 mg/dL   Total Protein 6.5 6.0 - 8.5 g/dL   Albumin 4.3 3.8 - 4.9 g/dL   Globulin, Total 2.2 1.5 - 4.5 g/dL   Bilirubin Total 0.3 0.0 - 1.2 mg/dL   Alkaline Phosphatase 108 44 - 121 IU/L   AST 15 0 - 40 IU/L   ALT 8 0 - 32 IU/L  Lipid panel   Collection Time: 10/28/22 10:18 AM  Result Value Ref Range   Cholesterol, Total 229 (H) 100 - 199 mg/dL   Triglycerides 895 0 - 149 mg/dL   HDL 72 >60 mg/dL   VLDL Cholesterol Cal 18 5 - 40 mg/dL   LDL Chol Calc (NIH) 860 (H) 0 - 99 mg/dL   Chol/HDL Ratio 3.2 0.0 - 4.4  ratio  TSH   Collection Time: 10/28/22 10:18 AM  Result Value Ref Range   TSH 1.070 0.450 - 4.500 uIU/mL      Assessment & Plan:   Problem List Items Addressed This Visit   None Visit Diagnoses     Chronic nonintractable headache, unspecified headache type    -  Primary Ongoing x several months. Patient is consuming close to  400mg  of caffeine daily, suspect headaches are partially related. Lengthy discussion had with patient regarding use of BCs and other caffeine consumpation. Suspect this is also contributing to her inability to sleep.  Do not advise patient take Clonazepam for sleep.  Will start Topamax  25mg  nightly to see if headaches improve.  Side effects and benefits of medication discussed.  Toradol  shot given today.  Follow up in 1 week.    Relevant Medications   ketorolac  (TORADOL ) injection 60 mg (Completed)   topiramate  (TOPAMAX ) 25 MG tablet        Follow up plan: Return in about 3 months (around 04/18/2023) for HTN, HLD, DM2 FU.

## 2023-01-18 NOTE — Assessment & Plan Note (Signed)
 Chronic.  Improved with Mirtazipine 15mg  daily.  Will increase dose from 7.5mg  to 15mg .  Patient has already been taking two pills.  Follow up in 3 months.  Call sooner if concerns arise.

## 2023-01-26 NOTE — Progress Notes (Deleted)
 Virtual Visit Via Video       Consent was obtained for video visit:  {yes no:314532} Answered questions that patient had about telehealth interaction:  {yes no:314532} I discussed the limitations, risks, security and privacy concerns of performing an evaluation and management service by telemedicine. I also discussed with the patient that there may be a patient responsible charge related to this service. The patient expressed understanding and agreed to proceed.  Pt location: Home Physician Location: office Name of referring provider:  Larae Grooms, NP I connected with Cynthia Sellers at patients initiation/request on 01/27/2023 at  2:30 PM EST by video enabled telemedicine application and verified that I am speaking with the correct person using two identifiers. Pt MRN:  [id number] Pt DOB:  07/31/1965 Video Participants:  Cynthia Sellers;  ***  Assessment/Plan:    1.  Essential Tremor  -I weaned her off of the Abilify.  She was never on Abilify very long, but continues to have mouth movements that look like tardive dyskinesia.  It is unclear if these were present prior to the start of Abilify.  Nonetheless, tardive dyskinesia certainly can be permanent.  These are not the movements, however, that are bothersome to her.  -Patient very much wants DBS for her fairly severe essential tremor.  We were in the process of getting her worked up for this, but then I got noticed that Dr. Jake Samples was leaving.  He is the only functional neurosurgeon within our system.  The biggest issue is that she has health team advantage and cannot cannot go to a lot of the surrounding health systems.  In addition, her mom is not young and does not like to drive far, and they felt comfortable in [place].  Unfortunately, that is just not going to be an option in the near future. Subjective:   Cynthia Sellers was seen today in follow up.  I saw her just a few weeks ago and I we just planning for DBS.   We had referred her to Dr. Jake Samples.  Unfortunately, I got a call from him after he got the referral that he is going to be [employer].  Current prescribed movement disorder medications: Primidone, 50 mg, 4 tablets at bed (now off) Current medications prescribed by [facility] neurology: Gabapentin 300 mg, 1 3 times per day Lexapro, 20 mg daily BuSpar 10 mg daily  ALLERGIES:   Allergies  Allergen Reactions   Vicodin [Hydrocodone-Acetaminophen] Nausea And Vomiting    CURRENT MEDICATIONS:  No outpatient medications have been marked as taking for the 01/27/23 encounter (Appointment) with Avie Checo, Octaviano Batty, DO.      Objective:    PHYSICAL EXAMINATION:    VITALS:   There were no vitals filed for this visit.  100% of this visit is in counseling. I have reviewed and interpreted the following labs independently   Chemistry      Component Value Date/Time   NA 143 10/28/2022 1018   NA 138 01/31/2012 1839   K 3.3 (L) 10/28/2022 1018   K 3.6 01/31/2012 1839   CL 105 10/28/2022 1018   CL 107 01/31/2012 1839   CO2 22 10/28/2022 1018   CO2 22 01/31/2012 1839   BUN 9 10/28/2022 1018   BUN 13 01/31/2012 1839   CREATININE 0.70 10/28/2022 1018   CREATININE 0.67 01/31/2012 1839      Component Value Date/Time   CALCIUM 8.8 10/28/2022 1018   CALCIUM 8.6 01/31/2012 1839   ALKPHOS 108 10/28/2022 1018  ALKPHOS 100 01/31/2012 1839   AST 15 10/28/2022 1018   AST 18 01/31/2012 1839   ALT 8 10/28/2022 1018   ALT 16 01/31/2012 1839   BILITOT 0.3 10/28/2022 1018   BILITOT 0.7 01/31/2012 1839      Lab Results  Component Value Date   WBC 4.5 10/28/2022   HGB 14.1 10/28/2022   HCT 42.7 10/28/2022   MCV 94 10/28/2022   PLT 254 10/28/2022   Lab Results  Component Value Date   TSH 1.070 10/28/2022     Chemistry      Component Value Date/Time   NA 143 10/28/2022 1018   NA 138 01/31/2012 1839   K 3.3 (L) 10/28/2022 1018   K 3.6 01/31/2012 1839   CL 105 10/28/2022 1018    CL 107 01/31/2012 1839   CO2 22 10/28/2022 1018   CO2 22 01/31/2012 1839   BUN 9 10/28/2022 1018   BUN 13 01/31/2012 1839   CREATININE 0.70 10/28/2022 1018   CREATININE 0.67 01/31/2012 1839      Component Value Date/Time   CALCIUM 8.8 10/28/2022 1018   CALCIUM 8.6 01/31/2012 1839   ALKPHOS 108 10/28/2022 1018   ALKPHOS 100 01/31/2012 1839   AST 15 10/28/2022 1018   AST 18 01/31/2012 1839   ALT 8 10/28/2022 1018   ALT 16 01/31/2012 1839   BILITOT 0.3 10/28/2022 1018   BILITOT 0.7 01/31/2012 1839         Total time spent on today's visit was *** minutes, including both face-to-face time and nonface-to-face time.  Time included that spent on review of records (prior notes available to me/labs/imaging if pertinent), discussing treatment and goals, answering patient's questions and coordinating care.  Cc:  Larae Grooms, NP

## 2023-01-27 ENCOUNTER — Telehealth: Payer: PPO | Admitting: Neurology

## 2023-01-27 ENCOUNTER — Ambulatory Visit: Payer: PPO | Admitting: Neurology

## 2023-01-28 ENCOUNTER — Ambulatory Visit: Payer: PPO | Admitting: Neurology

## 2023-01-28 NOTE — Progress Notes (Unsigned)
Virtual Visit Via Video       Consent was obtained for video visit:  {yes no:314532} Answered questions that patient had about telehealth interaction:  {yes no:314532} I discussed the limitations, risks, security and privacy concerns of performing an evaluation and management service by telemedicine. I also discussed with the patient that there may be a patient responsible charge related to this service. The patient expressed understanding and agreed to proceed.  Pt location: Home Physician Location: office Name of referring provider:  Larae Grooms, NP I connected with Cynthia Sellers at patients initiation/request on 02/01/2023 at 11:15 AM EST by video enabled telemedicine application and verified that I am speaking with the correct person using two identifiers. Pt MRN:  [id number] Pt DOB:  1965-01-14 Video Participants:  Cynthia Sellers;  ***  Assessment/Plan:    1.  Essential Tremor  -I weaned her off of the Abilify.  She was never on Abilify very long, but continues to have mouth movements that look like tardive dyskinesia.  It is unclear if these were present prior to the start of Abilify.  Nonetheless, tardive dyskinesia certainly can be permanent.  These are not the movements, however, that are bothersome to her.  -Patient very much wants DBS for her fairly severe essential tremor.  We were in the process of getting her worked up for this, but then I got noticed that Dr. Jake Samples was leaving.  He is the only functional neurosurgeon within our system.  The biggest issue is that she has health team advantage and cannot cannot go to a lot of the surrounding health systems.  In addition, her mom is not young and does not like to drive far, and they felt comfortable in [place].  Unfortunately, that is just not going to be an option in the near future. Subjective:   Cynthia Sellers was seen today in follow up.  I saw her just a few weeks ago and I we just planning for DBS.   We had referred her to Dr. Jake Samples.  Unfortunately, I got a call from him after he got the referral that he is going to be [employer].  Current prescribed movement disorder medications: Primidone, 50 mg, 4 tablets at bed (now off) Current medications prescribed by [facility] neurology: Gabapentin 300 mg, 1 3 times per day Lexapro, 20 mg daily BuSpar 10 mg daily  ALLERGIES:   Allergies  Allergen Reactions   Vicodin [Hydrocodone-Acetaminophen] Nausea And Vomiting    CURRENT MEDICATIONS:  No outpatient medications have been marked as taking for the 02/01/23 encounter (Appointment) with Zyan Coby, Octaviano Batty, DO.      Objective:    PHYSICAL EXAMINATION:    VITALS:   There were no vitals filed for this visit.  100% of this visit is in counseling. I have reviewed and interpreted the following labs independently   Chemistry      Component Value Date/Time   NA 143 10/28/2022 1018   NA 138 01/31/2012 1839   K 3.3 (L) 10/28/2022 1018   K 3.6 01/31/2012 1839   CL 105 10/28/2022 1018   CL 107 01/31/2012 1839   CO2 22 10/28/2022 1018   CO2 22 01/31/2012 1839   BUN 9 10/28/2022 1018   BUN 13 01/31/2012 1839   CREATININE 0.70 10/28/2022 1018   CREATININE 0.67 01/31/2012 1839      Component Value Date/Time   CALCIUM 8.8 10/28/2022 1018   CALCIUM 8.6 01/31/2012 1839   ALKPHOS 108 10/28/2022 1018  ALKPHOS 100 01/31/2012 1839   AST 15 10/28/2022 1018   AST 18 01/31/2012 1839   ALT 8 10/28/2022 1018   ALT 16 01/31/2012 1839   BILITOT 0.3 10/28/2022 1018   BILITOT 0.7 01/31/2012 1839      Lab Results  Component Value Date   WBC 4.5 10/28/2022   HGB 14.1 10/28/2022   HCT 42.7 10/28/2022   MCV 94 10/28/2022   PLT 254 10/28/2022   Lab Results  Component Value Date   TSH 1.070 10/28/2022     Chemistry      Component Value Date/Time   NA 143 10/28/2022 1018   NA 138 01/31/2012 1839   K 3.3 (L) 10/28/2022 1018   K 3.6 01/31/2012 1839   CL 105 10/28/2022 1018    CL 107 01/31/2012 1839   CO2 22 10/28/2022 1018   CO2 22 01/31/2012 1839   BUN 9 10/28/2022 1018   BUN 13 01/31/2012 1839   CREATININE 0.70 10/28/2022 1018   CREATININE 0.67 01/31/2012 1839      Component Value Date/Time   CALCIUM 8.8 10/28/2022 1018   CALCIUM 8.6 01/31/2012 1839   ALKPHOS 108 10/28/2022 1018   ALKPHOS 100 01/31/2012 1839   AST 15 10/28/2022 1018   AST 18 01/31/2012 1839   ALT 8 10/28/2022 1018   ALT 16 01/31/2012 1839   BILITOT 0.3 10/28/2022 1018   BILITOT 0.7 01/31/2012 1839         Total time spent on today's visit was *** minutes, including both face-to-face time and nonface-to-face time.  Time included that spent on review of records (prior notes available to me/labs/imaging if pertinent), discussing treatment and goals, answering patient's questions and coordinating care.  Cc:  Larae Grooms, NP

## 2023-02-01 ENCOUNTER — Other Ambulatory Visit: Payer: Self-pay

## 2023-02-01 ENCOUNTER — Telehealth: Payer: PPO | Admitting: Neurology

## 2023-02-01 DIAGNOSIS — G250 Essential tremor: Secondary | ICD-10-CM
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
## 2023-02-01 NOTE — Progress Notes (Signed)
Referral sent to Dr. Carlyn Reichert at [facility]

## 2023-02-07 ENCOUNTER — Ambulatory Visit (INDEPENDENT_AMBULATORY_CARE_PROVIDER_SITE_OTHER): Payer: PPO | Admitting: Psychiatry

## 2023-02-07 ENCOUNTER — Encounter: Payer: Self-pay | Admitting: Psychiatry

## 2023-02-07 VITALS — BP 118/84 | HR 82 | Temp 98.4°F | Ht 64.0 in | Wt 195.2 lb

## 2023-02-07 DIAGNOSIS — R632 Polyphagia: Secondary | ICD-10-CM

## 2023-02-07 DIAGNOSIS — F3341 Major depressive disorder, recurrent, in partial remission: Secondary | ICD-10-CM

## 2023-02-07 DIAGNOSIS — F411 Generalized anxiety disorder: Secondary | ICD-10-CM | POA: Diagnosis not present

## 2023-02-07 DIAGNOSIS — F4321 Adjustment disorder with depressed mood: Secondary | ICD-10-CM | POA: Diagnosis not present

## 2023-02-07 NOTE — Patient Instructions (Signed)
Continue lexapro 20 mg at night Continue Buspar 5 mg at night  Next appointment: 3/31 at 11:30

## 2023-03-15 ENCOUNTER — Telehealth: Payer: Self-pay | Admitting: Neurology

## 2023-03-15 NOTE — Telephone Encounter (Signed)
 Caller requesting to speak with tat's nurse stating she has not heard anything from [facility] yet.

## 2023-03-18 NOTE — Telephone Encounter (Signed)
 We received documentation that the PA was approved for patient to go to [facility] for procedure. I called patient and let her know and I reached out via Email to let Dr. Carlyn Reichert know and to tell him if he needs me to do anything on our end to assist

## 2023-03-24 ENCOUNTER — Telehealth: Payer: Self-pay | Admitting: Neurology

## 2023-03-24 NOTE — Telephone Encounter (Signed)
 Pt. Called and [facility] denied surgery as OON and she would like to know her other options to have this surgery, contact her mother Claudius Sis [phone]

## 2023-03-24 NOTE — Telephone Encounter (Signed)
 Sent to Dr. Carlyn Reichert II

## 2023-04-01 NOTE — Progress Notes (Unsigned)
 No show

## 2023-04-04 ENCOUNTER — Ambulatory Visit (INDEPENDENT_AMBULATORY_CARE_PROVIDER_SITE_OTHER): Payer: PPO | Admitting: Psychiatry

## 2023-04-04 DIAGNOSIS — Z91199 Patient's noncompliance with other medical treatment and regimen due to unspecified reason: Secondary | ICD-10-CM

## 2023-04-26 ENCOUNTER — Ambulatory Visit (INDEPENDENT_AMBULATORY_CARE_PROVIDER_SITE_OTHER): Payer: Self-pay | Admitting: Nurse Practitioner

## 2023-04-26 ENCOUNTER — Encounter: Payer: Self-pay | Admitting: Nurse Practitioner

## 2023-04-26 VITALS — BP 131/89 | HR 71 | Temp 98.2°F | Resp 15 | Ht 64.02 in | Wt 194.4 lb

## 2023-04-26 DIAGNOSIS — F411 Generalized anxiety disorder: Secondary | ICD-10-CM

## 2023-04-26 DIAGNOSIS — I10 Essential (primary) hypertension: Secondary | ICD-10-CM

## 2023-04-26 DIAGNOSIS — E7800 Pure hypercholesterolemia, unspecified: Secondary | ICD-10-CM | POA: Diagnosis not present

## 2023-04-26 DIAGNOSIS — G4700 Insomnia, unspecified: Secondary | ICD-10-CM

## 2023-04-26 DIAGNOSIS — F330 Major depressive disorder, recurrent, mild: Secondary | ICD-10-CM | POA: Diagnosis not present

## 2023-04-26 MED ORDER — VALSARTAN 40 MG PO TABS: 40.0000 mg | ORAL_TABLET | Freq: Every day | ORAL | 1 refills | Status: DC

## 2023-04-26 NOTE — Progress Notes (Signed)
 BP 131/89 (BP Location: Right Arm, Patient Position: Sitting, Cuff Size: Large)   Pulse 71   Temp 98.2 F (36.8 C) (Oral)   Resp 15   Ht 5' 4.02" (1.626 m)   Wt 194 lb 6.4 oz (88.2 kg)   SpO2 98%   BMI 33.35 kg/m    Subjective:    Patient ID: Cynthia Sellers, female    DOB: Jul 26, 1965, 58 y.o.   MRN: [id number]  HPI: Cynthia Sellers is a 58 y.o. female  Chief Complaint  Patient presents with  . Hypertension    Does not check at home.   . Follow-up    3 month follow up.    HYPERTENSION without Chronic Kidney Disease Hypertension status: controlled  Satisfied with current treatment? yes Duration of hypertension: years BP monitoring frequency:  not checking BP range:  BP medication side effects:  no Medication compliance: excellent compliance Previous BP meds:valsartan  Aspirin: no Recurrent headaches: no Visual changes: no Palpitations: no Dyspnea: no Chest pain: no Lower extremity edema: no Dizzy/lightheaded: no  SLEEP She is sleeping better. Doing well with the Mirtazipine.  She is doing well.    TREMOR Patient's tremor is ongoing.  She is now going to [facility] due to the Neurosurgeon at [facility] quitting.  She does plan to do the DBS.   DEPRESSION Patient will be making an appt with Dr. Edda Goo.  She doesn't feel like she needs to see her anymore.  She would like to have her medications filled here at our office.     04/26/2023    9:02 AM 02/07/2023   12:17 PM 01/18/2023    2:08 PM 11/18/2022    1:26 PM 10/28/2022    9:53 AM  Depression screen PHQ 2/9  Decreased Interest 0 0 0 0 0  Down, Depressed, Hopeless 0 0 0 0 0  PHQ - 2 Score 0 0 0 0 0  Altered sleeping 0 2 3 3  0  Tired, decreased energy 0 0 1 0 1  Change in appetite 0 2 3 0 3  Feeling bad or failure about yourself  0 0 0 0 0  Trouble concentrating 0 0 0 2 0  Moving slowly or fidgety/restless 0 0 0 2 0  Suicidal thoughts 0 0 0 0   PHQ-9 Score 0 4 7 7 4   Difficult doing work/chores     Not  difficult at all    Relevant past medical, surgical, family and social history reviewed and updated as indicated. Interim medical history since our last visit reviewed. Allergies and medications reviewed and updated.  Review of Systems  Eyes:  Negative for visual disturbance.  Respiratory:  Negative for cough, chest tightness and shortness of breath.   Cardiovascular:  Negative for chest pain, palpitations and leg swelling.  Neurological:  Positive for tremors. Negative for dizziness and headaches.  Psychiatric/Behavioral:  Positive for dysphoric mood. Negative for suicidal ideas. The patient is nervous/anxious.     Per HPI unless specifically indicated above     Objective:    BP 131/89 (BP Location: Right Arm, Patient Position: Sitting, Cuff Size: Large)   Pulse 71   Temp 98.2 F (36.8 C) (Oral)   Resp 15   Ht 5' 4.02" (1.626 m)   Wt 194 lb 6.4 oz (88.2 kg)   SpO2 98%   BMI 33.35 kg/m   Wt Readings from Last 3 Encounters:  04/26/23 194 lb 6.4 oz (88.2 kg)  02/07/23 195 lb 3.2 oz (88.5 kg)  01/18/23 192 lb 6.4 oz (87.3 kg)    Physical Exam Vitals and nursing note reviewed.  Constitutional:      General: She is not in acute distress.    Appearance: Normal appearance. She is not ill-appearing, toxic-appearing or diaphoretic.  HENT:     Head: Normocephalic.     Right Ear: External ear normal.     Left Ear: External ear normal.     Nose: Nose normal.     Mouth/Throat:     Mouth: Mucous membranes are moist.     Pharynx: Oropharynx is clear.  Eyes:     General:        Right eye: No discharge.        Left eye: No discharge.     Extraocular Movements: Extraocular movements intact.     Conjunctiva/sclera: Conjunctivae normal.     Pupils: Pupils are equal, round, and reactive to light.  Cardiovascular:     Rate and Rhythm: Normal rate and regular rhythm.     Heart sounds: No murmur heard. Pulmonary:     Effort: Pulmonary effort is normal. No respiratory distress.      Breath sounds: Normal breath sounds. No wheezing or rales.  Musculoskeletal:     Cervical back: Normal range of motion and neck supple.  Skin:    General: Skin is warm and dry.     Capillary Refill: Capillary refill takes less than 2 seconds.  Neurological:     General: No focal deficit present.     Mental Status: She is alert and oriented to person, place, and time. Mental status is at baseline.     Motor: Tremor (mouth and hands) present.  Psychiatric:        Mood and Affect: Mood normal.        Behavior: Behavior normal.        Thought Content: Thought content normal.        Judgment: Judgment normal.    Results for orders placed or performed in visit on 10/28/22  Microscopic Examination   Collection Time: 10/28/22 10:17 AM   Urine  Result Value Ref Range   WBC, UA 6-10 (A) 0 - 5 /hpf   RBC, Urine 0-2 0 - 2 /hpf   Epithelial Cells (non renal) 0-10 0 - 10 /hpf   Mucus, UA Present (A) Not Estab.   Bacteria, UA None seen None seen/Few  Urinalysis, Routine w reflex microscopic   Collection Time: 10/28/22 10:17 AM  Result Value Ref Range   Specific Gravity, UA 1.020 1.005 - 1.030   pH, UA 6.0 5.0 - 7.5   Color, UA Yellow Yellow   Appearance Ur Cloudy (A) Clear   Leukocytes,UA 1+ (A) Negative   Protein,UA Negative Negative/Trace   Glucose, UA Negative Negative   Ketones, UA Negative Negative   RBC, UA Trace (A) Negative   Bilirubin, UA Negative Negative   Urobilinogen, Ur 1.0 0.2 - 1.0 mg/dL   Nitrite, UA Negative Negative   Microscopic Examination See below:   CBC with Differential/Platelet   Collection Time: 10/28/22 10:18 AM  Result Value Ref Range   WBC 4.5 3.4 - 10.8 x10E3/uL   RBC 4.53 3.77 - 5.28 x10E6/uL   Hemoglobin 14.1 11.1 - 15.9 g/dL   Hematocrit 52.8 41.3 - 46.6 %   MCV 94 79 - 97 fL   MCH 31.1 26.6 - 33.0 pg   MCHC 33.0 31.5 - 35.7 g/dL   RDW 24.4 01.0 - 27.2 %   Platelets  254 150 - 450 x10E3/uL   Neutrophils 62 Not Estab. %   Lymphs 29 Not Estab. %    Monocytes 8 Not Estab. %   Eos 1 Not Estab. %   Basos 0 Not Estab. %   Neutrophils Absolute 2.8 1.4 - 7.0 x10E3/uL   Lymphocytes Absolute 1.3 0.7 - 3.1 x10E3/uL   Monocytes Absolute 0.4 0.1 - 0.9 x10E3/uL   EOS (ABSOLUTE) 0.1 0.0 - 0.4 x10E3/uL   Basophils Absolute 0.0 0.0 - 0.2 x10E3/uL   Immature Granulocytes 0 Not Estab. %   Immature Grans (Abs) 0.0 0.0 - 0.1 x10E3/uL  Comprehensive metabolic panel   Collection Time: 10/28/22 10:18 AM  Result Value Ref Range   Glucose 65 (L) 70 - 99 mg/dL   BUN 9 6 - 24 mg/dL   Creatinine, Ser 1.61 0.57 - 1.00 mg/dL   eGFR 096 >04 VW/UJW/1.19   BUN/Creatinine Ratio 13 9 - 23   Sodium 143 134 - 144 mmol/L   Potassium 3.3 (L) 3.5 - 5.2 mmol/L   Chloride 105 96 - 106 mmol/L   CO2 22 20 - 29 mmol/L   Calcium 8.8 8.7 - 10.2 mg/dL   Total Protein 6.5 6.0 - 8.5 g/dL   Albumin 4.3 3.8 - 4.9 g/dL   Globulin, Total 2.2 1.5 - 4.5 g/dL   Bilirubin Total 0.3 0.0 - 1.2 mg/dL   Alkaline Phosphatase 108 44 - 121 IU/L   AST 15 0 - 40 IU/L   ALT 8 0 - 32 IU/L  Lipid panel   Collection Time: 10/28/22 10:18 AM  Result Value Ref Range   Cholesterol, Total 229 (H) 100 - 199 mg/dL   Triglycerides 147 0 - 149 mg/dL   HDL 72 >82 mg/dL   VLDL Cholesterol Cal 18 5 - 40 mg/dL   LDL Chol Calc (NIH) 956 (H) 0 - 99 mg/dL   Chol/HDL Ratio 3.2 0.0 - 4.4 ratio  TSH   Collection Time: 10/28/22 10:18 AM  Result Value Ref Range   TSH 1.070 0.450 - 4.500 uIU/mL      Assessment & Plan:   Problem List Items Addressed This Visit       Cardiovascular and Mediastinum   Primary hypertension   Chronic.  Controlled.  Continue with current medication regimen.  Labs ordered today.  Return to clinic in 6 months for reevaluation.  Call sooner if concerns arise.        Relevant Medications   valsartan  (DIOVAN ) 40 MG tablet   Other Relevant Orders   Comprehensive metabolic panel with GFR     Other   Generalized anxiety disorder   Chronic.  Controlled per patient.   Plans to follow up here.  Does not feel like she needs Psychiatry and longer.  Continue with lexapro  and Buspar .  Follow up in 6 months.  Call sooner if concerns arise.       Major depressive disorder - Primary   Chronic.  Controlled per patient.  Plans to follow up here.  Does not feel like she needs Psychiatry and longer.  Continue with lexapro  and Buspar .  Follow up in 6 months.  Call sooner if concerns arise.       Insomnia   Chronic.  Controlled.  Continue with current medication regimen of Mirtazipine.  Labs ordered today.  Return to clinic in 6 months for reevaluation.  Call sooner if concerns arise.        Hypercholesteremia   Labs ordered at visit today.  Will  make recommendations based on lab results.        Relevant Medications   valsartan  (DIOVAN ) 40 MG tablet   Other Relevant Orders   Lipid panel     Follow up plan: Return in about 6 months (around 10/26/2023) for Physical and Fasting labs.

## 2023-04-26 NOTE — Assessment & Plan Note (Signed)
 Chronic.  Controlled.  Continue with current medication regimen of Mirtazipine.  Labs ordered today.  Return to clinic in 6 months for reevaluation.  Call sooner if concerns arise.

## 2023-04-26 NOTE — Assessment & Plan Note (Signed)
 Chronic.  Controlled per patient.  Plans to follow up here.  Does not feel like she needs Psychiatry and longer.  Continue with lexapro  and Buspar .  Follow up in 6 months.  Call sooner if concerns arise.

## 2023-04-26 NOTE — Assessment & Plan Note (Signed)
 Chronic.  Controlled.  Continue with current medication regimen.  Labs ordered today.  Return to clinic in 6 months for reevaluation.  Call sooner if concerns arise.  ? ?

## 2023-04-26 NOTE — Assessment & Plan Note (Signed)
 Labs ordered at visit today.  Will make recommendations based on lab results.

## 2023-04-27 ENCOUNTER — Encounter: Payer: Self-pay | Admitting: Nurse Practitioner

## 2023-04-27 LAB — COMPREHENSIVE METABOLIC PANEL WITH GFR
ALT: 11 IU/L (ref 0–32)
AST: 17 IU/L (ref 0–40)
Albumin: 4.2 g/dL (ref 3.8–4.9)
Alkaline Phosphatase: 111 IU/L (ref 44–121)
BUN/Creatinine Ratio: 20 (ref 9–23)
BUN: 13 mg/dL (ref 6–24)
Bilirubin Total: 0.3 mg/dL (ref 0.0–1.2)
CO2: 24 mmol/L (ref 20–29)
Calcium: 9.1 mg/dL (ref 8.7–10.2)
Chloride: 105 mmol/L (ref 96–106)
Creatinine, Ser: 0.64 mg/dL (ref 0.57–1.00)
Globulin, Total: 2.5 g/dL (ref 1.5–4.5)
Glucose: 67 mg/dL — ABNORMAL LOW (ref 70–99)
Potassium: 4.1 mmol/L (ref 3.5–5.2)
Sodium: 143 mmol/L (ref 134–144)
Total Protein: 6.7 g/dL (ref 6.0–8.5)
eGFR: 102 mL/min/{1.73_m2} (ref >59)

## 2023-04-27 LAB — LIPID PANEL
Chol/HDL Ratio: 3 ratio (ref 0.0–4.4)
Cholesterol, Total: 220 mg/dL — ABNORMAL HIGH (ref 100–199)
HDL: 73 mg/dL (ref >39)
LDL Chol Calc (NIH): 133 mg/dL — ABNORMAL HIGH (ref 0–99)
Triglycerides: 79 mg/dL (ref 0–149)
VLDL Cholesterol Cal: 14 mg/dL (ref 5–40)

## 2023-04-28 ENCOUNTER — Ambulatory Visit: Payer: Self-pay | Admitting: Nurse Practitioner

## 2023-05-01 ENCOUNTER — Other Ambulatory Visit: Payer: Self-pay | Admitting: Nurse Practitioner

## 2023-05-03 NOTE — Telephone Encounter (Signed)
 Requested medication (s) are due for refill today: yes  Requested medication (s) are on the active medication list: yes  Last refill:  10/28/22  Future visit scheduled: no  Notes to clinic:  Unable to refill per protocol, last refill by historical  provider.      Requested Prescriptions  Pending Prescriptions Disp Refills   busPIRone  (BUSPAR ) 5 MG tablet [Pharmacy Med Name: busPIRone  HCl 5 MG Oral Tablet] 180 tablet 0    Sig: Take 1 tablet by mouth twice daily     Psychiatry: Anxiolytics/Hypnotics - Non-controlled Passed - 05/03/2023  9:25 AM      Passed - Valid encounter within last 12 months    Recent Outpatient Visits           1 week ago Mild episode of recurrent major depressive disorder [facility])   [place] [facility] Aileen Alexanders, NP       Future Appointments             In 3 months MacDiarmid, Geralyn Knee, MD [facility]rology [facility]

## 2023-05-16 DIAGNOSIS — Z79899 Other long term (current) drug therapy: Secondary | ICD-10-CM | POA: Diagnosis not present

## 2023-05-16 DIAGNOSIS — G250 Essential tremor: Secondary | ICD-10-CM | POA: Diagnosis not present

## 2023-05-16 DIAGNOSIS — Z01818 Encounter for other preprocedural examination: Secondary | ICD-10-CM | POA: Diagnosis not present

## 2023-06-27 ENCOUNTER — Other Ambulatory Visit: Payer: Self-pay | Admitting: Nurse Practitioner

## 2023-06-29 NOTE — Telephone Encounter (Signed)
 Requested Prescriptions  Pending Prescriptions Disp Refills   mirtazapine  (REMERON ) 15 MG tablet [Pharmacy Med Name: Mirtazapine  15 MG Oral Tablet] 90 tablet 0    Sig: TAKE 1 TABLET BY MOUTH AT BEDTIME     Psychiatry: Antidepressants - mirtazapine  Passed - 06/29/2023  1:34 PM      Passed - Completed PHQ-2 or PHQ-9 in the last 360 days      Passed - Valid encounter within last 6 months    Recent Outpatient Visits           2 months ago Mild episode of recurrent major depressive disorder [facility])   [place] [facility] Melvin Pao, NP       Future Appointments             In 1 month MacDiarmid, [place], MD [facility] Urology [facility]

## 2023-07-25 DIAGNOSIS — R569 Unspecified convulsions: Secondary | ICD-10-CM | POA: Diagnosis not present

## 2023-07-25 DIAGNOSIS — R4189 Other symptoms and signs involving cognitive functions and awareness: Secondary | ICD-10-CM | POA: Diagnosis not present

## 2023-07-25 DIAGNOSIS — R110 Nausea: Secondary | ICD-10-CM | POA: Diagnosis not present

## 2023-07-25 DIAGNOSIS — G250 Essential tremor: Secondary | ICD-10-CM | POA: Diagnosis not present

## 2023-07-25 DIAGNOSIS — R9431 Abnormal electrocardiogram [ECG] [EKG]: Secondary | ICD-10-CM | POA: Diagnosis not present

## 2023-07-25 DIAGNOSIS — G9600 Cerebrospinal fluid leak, unspecified: Secondary | ICD-10-CM | POA: Diagnosis not present

## 2023-07-25 DIAGNOSIS — I119 Hypertensive heart disease without heart failure: Secondary | ICD-10-CM | POA: Diagnosis not present

## 2023-07-25 DIAGNOSIS — Z01818 Encounter for other preprocedural examination: Secondary | ICD-10-CM | POA: Diagnosis not present

## 2023-07-25 DIAGNOSIS — I10 Essential (primary) hypertension: Secondary | ICD-10-CM | POA: Diagnosis not present

## 2023-07-29 ENCOUNTER — Other Ambulatory Visit: Payer: Self-pay | Admitting: Nurse Practitioner

## 2023-07-31 ENCOUNTER — Other Ambulatory Visit: Payer: Self-pay | Admitting: Nurse Practitioner

## 2023-08-01 NOTE — Telephone Encounter (Signed)
 Requested Prescriptions  Pending Prescriptions Disp Refills   escitalopram  (LEXAPRO ) 20 MG tablet [Pharmacy Med Name: Escitalopram  Oxalate 20 MG Oral Tablet] 90 tablet 0    Sig: Take 1 tablet by mouth once daily     Psychiatry:  Antidepressants - SSRI Passed - 08/01/2023  4:24 PM      Passed - Completed PHQ-2 or PHQ-9 in the last 360 days      Passed - Valid encounter within last 6 months    Recent Outpatient Visits           3 months ago Mild episode of recurrent major depressive disorder [facility]   [place] [facility] Melvin Pao, NP       Future Appointments             In 2 weeks MacDiarmid, Glendia, MD [facility]rology [facility]

## 2023-08-01 NOTE — Telephone Encounter (Signed)
 Requested Prescriptions  Pending Prescriptions Disp Refills   busPIRone  (BUSPAR ) 5 MG tablet [Pharmacy Med Name: busPIRone  HCl 5 MG Oral Tablet] 180 tablet 0    Sig: Take 1 tablet by mouth twice daily     Psychiatry: Anxiolytics/Hypnotics - Non-controlled Passed - 08/01/2023  7:55 AM      Passed - Valid encounter within last 12 months    Recent Outpatient Visits           3 months ago Mild episode of recurrent major depressive disorder [facility])   [place] [facility] Melvin Pao, NP       Future Appointments             In 2 weeks MacDiarmid, Glendia, MD [facility]rology [facility]

## 2023-08-10 DIAGNOSIS — F32A Depression, unspecified: Secondary | ICD-10-CM | POA: Diagnosis not present

## 2023-08-10 DIAGNOSIS — F411 Generalized anxiety disorder: Secondary | ICD-10-CM | POA: Diagnosis not present

## 2023-08-10 DIAGNOSIS — K219 Gastro-esophageal reflux disease without esophagitis: Secondary | ICD-10-CM | POA: Diagnosis not present

## 2023-08-10 DIAGNOSIS — Z885 Allergy status to narcotic agent status: Secondary | ICD-10-CM | POA: Diagnosis not present

## 2023-08-10 DIAGNOSIS — I10 Essential (primary) hypertension: Secondary | ICD-10-CM | POA: Diagnosis not present

## 2023-08-10 DIAGNOSIS — Z9682 Presence of neurostimulator: Secondary | ICD-10-CM | POA: Diagnosis not present

## 2023-08-10 DIAGNOSIS — G250 Essential tremor: Secondary | ICD-10-CM | POA: Diagnosis not present

## 2023-08-10 DIAGNOSIS — Z79899 Other long term (current) drug therapy: Secondary | ICD-10-CM | POA: Diagnosis not present

## 2023-08-10 HISTORY — PX: DEEP BRAIN STIMULATOR PLACEMENT: SHX608

## 2023-08-15 ENCOUNTER — Ambulatory Visit: Payer: Self-pay | Admitting: Urology

## 2023-08-26 DIAGNOSIS — Z9689 Presence of other specified functional implants: Secondary | ICD-10-CM | POA: Diagnosis not present

## 2023-08-26 DIAGNOSIS — Z48811 Encounter for surgical aftercare following surgery on the nervous system: Secondary | ICD-10-CM | POA: Diagnosis not present

## 2023-08-26 DIAGNOSIS — G250 Essential tremor: Secondary | ICD-10-CM | POA: Diagnosis not present

## 2023-09-07 DIAGNOSIS — G250 Essential tremor: Secondary | ICD-10-CM | POA: Diagnosis not present

## 2023-09-07 DIAGNOSIS — Z9689 Presence of other specified functional implants: Secondary | ICD-10-CM | POA: Diagnosis not present

## 2023-09-17 ENCOUNTER — Other Ambulatory Visit: Payer: Self-pay | Admitting: Nurse Practitioner

## 2023-09-19 NOTE — Telephone Encounter (Signed)
 Requested Prescriptions  Pending Prescriptions Disp Refills   mirtazapine  (REMERON ) 15 MG tablet [Pharmacy Med Name: Mirtazapine  15 MG Oral Tablet] 90 tablet 0    Sig: TAKE 1 TABLET BY MOUTH AT BEDTIME     Psychiatry: Antidepressants - mirtazapine  Passed - 09/19/2023  4:19 PM      Passed - Completed PHQ-2 or PHQ-9 in the last 360 days      Passed - Valid encounter within last 6 months    Recent Outpatient Visits           4 months ago Mild episode of recurrent major depressive disorder [facility])   [place] [facility] Melvin Pao, NP

## 2023-10-12 DIAGNOSIS — Z9689 Presence of other specified functional implants: Secondary | ICD-10-CM | POA: Diagnosis not present

## 2023-10-12 DIAGNOSIS — G250 Essential tremor: Secondary | ICD-10-CM | POA: Diagnosis not present

## 2023-10-29 ENCOUNTER — Other Ambulatory Visit: Payer: Self-pay | Admitting: Nurse Practitioner

## 2023-10-31 ENCOUNTER — Ambulatory Visit (INDEPENDENT_AMBULATORY_CARE_PROVIDER_SITE_OTHER): Admitting: Nurse Practitioner

## 2023-10-31 ENCOUNTER — Encounter: Payer: Self-pay | Admitting: Nurse Practitioner

## 2023-10-31 VITALS — BP 124/88 | HR 78 | Temp 97.9°F | Ht 63.6 in | Wt 188.6 lb

## 2023-10-31 DIAGNOSIS — E7800 Pure hypercholesterolemia, unspecified: Secondary | ICD-10-CM | POA: Diagnosis not present

## 2023-10-31 DIAGNOSIS — Z1211 Encounter for screening for malignant neoplasm of colon: Secondary | ICD-10-CM | POA: Diagnosis not present

## 2023-10-31 DIAGNOSIS — I10 Essential (primary) hypertension: Secondary | ICD-10-CM | POA: Diagnosis not present

## 2023-10-31 DIAGNOSIS — G4700 Insomnia, unspecified: Secondary | ICD-10-CM

## 2023-10-31 DIAGNOSIS — Z0000 Encounter for general adult medical examination without abnormal findings: Secondary | ICD-10-CM

## 2023-10-31 DIAGNOSIS — Z23 Encounter for immunization: Secondary | ICD-10-CM

## 2023-10-31 DIAGNOSIS — G250 Essential tremor: Secondary | ICD-10-CM | POA: Diagnosis not present

## 2023-10-31 MED ORDER — OMEPRAZOLE 20 MG PO CPDR: 20.0000 mg | DELAYED_RELEASE_CAPSULE | Freq: Two times a day (BID) | ORAL | 1 refills | Status: AC

## 2023-10-31 MED ORDER — ESCITALOPRAM OXALATE 20 MG PO TABS: 20.0000 mg | ORAL_TABLET | Freq: Every day | ORAL | 0 refills | Status: DC

## 2023-10-31 MED ORDER — MIRTAZAPINE 30 MG PO TABS: 30.0000 mg | ORAL_TABLET | Freq: Every day | ORAL | 1 refills | Status: AC

## 2023-10-31 MED ORDER — BUSPIRONE HCL 5 MG PO TABS: 5.0000 mg | ORAL_TABLET | Freq: Two times a day (BID) | ORAL | 0 refills | Status: DC

## 2023-10-31 MED ORDER — VALSARTAN 40 MG PO TABS: 40.0000 mg | ORAL_TABLET | Freq: Every day | ORAL | 1 refills | Status: AC

## 2023-10-31 NOTE — Assessment & Plan Note (Signed)
 Underwent deep brain stimulation in August 2025.  Doing well.  No longer on Seroquel , Gabapentin or Klonopin.  Reviewed recent note.  Continue to follow up with Neurology.

## 2023-10-31 NOTE — Progress Notes (Signed)
 BP 124/88 (BP Location: Left Arm, Cuff Size: Normal)   Pulse 78   Temp 97.9 F (36.6 C) (Oral)   Ht 5' 3.6 (1.615 m)   Wt 188 lb 9.6 oz (85.5 kg)   SpO2 96%   BMI 32.78 kg/m    Subjective:    Patient ID: Cynthia Sellers, female    DOB: 18-Oct-1965, 58 y.o.   MRN: [id number]  HPI: Cynthia Sellers is a 58 y.o. female presenting on 10/31/2023 for comprehensive medical examination. Current medical complaints include:none  She currently lives with: Menopausal Symptoms: no  TREMOR Patient under went deep brain stimulation on 08/10/23 at [facility].  States she has seen improvement with her hands and her speech.  She is very happy with the results.  Followed up with Neurology on 10/12/23.  states she has been seeing Dr. Lane for her tremor.    ANXIETY Patient states she feels like she is doing well.  She is taking Lexapro  and Buspar .  She is no longer taking Seroquel  or Klonopin.  She is using Mirtazipine 30mg  to sleep.    HYPERTENSION without Chronic Kidney Disease Hypertension status: controlled  Satisfied with current treatment? no Duration of hypertension: years BP monitoring frequency:  not checking BP range:  BP medication side effects:  no Medication compliance: excellent compliance Previous BP meds:valsartan  Aspirin: no Recurrent headaches: no Visual changes: no Palpitations: no Dyspnea: no Chest pain: no Lower extremity edema: no Dizzy/lightheaded: no   Depression Screen    10/31/2023    8:51 AM 04/26/2023    9:02 AM 02/07/2023   12:17 PM 01/18/2023    2:08 PM 11/18/2022    1:26 PM  Depression screen PHQ 2/9  Decreased Interest 0 0  0 0  Down, Depressed, Hopeless 1 0  0 0  PHQ - 2 Score 1 0  0 0  Altered sleeping 2 0  3 3  Tired, decreased energy 0 0  1 0  Change in appetite 2 0  3 0  Feeling bad or failure about yourself  0 0  0 0  Trouble concentrating 0 0  0 2  Moving slowly or fidgety/restless 0 0  0 2  Suicidal thoughts 0 0  0 0  PHQ-9 Score 5 0  7  7  Difficult doing work/chores Somewhat difficult         Information is confidential and restricted. Go to Review Flowsheets to unlock data.     Past Medical History:  Past Medical History:  Diagnosis Date   Adenomatous polyp of ascending colon    Breast lipoma 07/01/2017   Complication of anesthesia    Essential tremor 09/11/2019   Gastric erosion    Generalized anxiety disorder 09/11/2019   GERD (gastroesophageal reflux disease)    Headache, tension type, episodic    Excedrin tension headache helps   Insomnia 09/15/2020   Major depressive disorder 09/11/2019   Mild cognitive impairment 07/28/2022   Obesity (BMI 30.0-34.9) 09/11/2019   Polyp of descending colon    PONV (postoperative nausea and vomiting)    with hysterectomy   Primary hypertension 05/19/2021   Suicide attempt 1992   overdose of sleeping pills   Urge urinary incontinence 09/11/2019    Surgical History:  Past Surgical History:  Procedure Laterality Date   BILATERAL SALPINGOOPHORECTOMY  01/04/2010   COLONOSCOPY WITH PROPOFOL  N/A 11/10/2020   Procedure: COLONOSCOPY WITH PROPOFOL ;  Surgeon: Unk Corinn Skiff, MD;  Location: [facility] ENDOSCOPY;  Service: Gastroenterology;  Laterality: N/A;  DEEP BRAIN STIMULATOR PLACEMENT  08/10/2023   ESOPHAGOGASTRODUODENOSCOPY N/A 11/10/2020   Procedure: ESOPHAGOGASTRODUODENOSCOPY (EGD);  Surgeon: Unk Corinn Skiff, MD;  Location: [facility] ENDOSCOPY;  Service: Gastroenterology;  Laterality: N/A;   HEEL SPUR EXCISION Left 01/05/2007   LIPOMA EXCISION Left 07/13/2017   Procedure: EXCISION LIPOMA-LEFT BREAST;  Surgeon: Dessa Reyes ORN, MD;  Location: [facility] ORS;  Service: General;  Laterality: Left;   SUBMANDIBULAR GLAND EXCISION Right 06/10/2021   Procedure: EXCISION SUBMANDIBULAR GLAND;  Surgeon: Blair Mt, MD;  Location: [facility] ORS;  Service: ENT;  Laterality: Right;   TOTAL ABDOMINAL HYSTERECTOMY W/ BILATERAL SALPINGOOPHORECTOMY  01/04/2010    Medications:  Current  Outpatient Medications on File Prior to Visit  Medication Sig   mirabegron  ER (MYRBETRIQ ) 50 MG TB24 tablet Take 1 tablet (50 mg total) by mouth daily.   No current facility-administered medications on file prior to visit.    Allergies:  Allergies  Allergen Reactions   Vicodin [Hydrocodone -Acetaminophen ] Nausea And Vomiting    Social History:  Social History   Socioeconomic History   Marital status: Widowed    Spouse name: Not on file   Number of children: 2   Years of education: 10   Highest education level: 10th grade  Occupational History   Occupation: Disability  Tobacco Use   Smoking status: Never    Passive exposure: Never   Smokeless tobacco: Never  Vaping Use   Vaping status: Never Used  Substance and Sexual Activity   Alcohol use: Never   Drug use: Never   Sexual activity: Not Currently    Birth control/protection: Surgical  Other Topics Concern   Not on file  Social History Narrative   Right handed   Disability    Social Drivers of Health   Financial Resource Strain: Medium Risk (08/16/2023)   Received from [facility]   Overall Financial Resource Strain (CARDIA)    Difficulty of Paying Living Expenses: Somewhat hard  Food Insecurity: No Food Insecurity (08/16/2023)   [facility]   Hunger Vital Sign    Within the past 12 months, you worried that your food would run out before you got the money to buy more.: Never true    Within the past 12 months, the food you bought just didn't last and you didn't have money to get more.: Never true  Transportation Needs: No Transportation Needs (08/16/2023)   Received from [facility] - Transportation    In the past 12 months, has lack of transportation kept you from medical appointments or from getting medications?: No    Lack of Transportation (Non-Medical): No  Physical Activity: Sufficiently Active (10/28/2022)   Exercise Vital Sign     Days of Exercise per Week: 7 days    Minutes of Exercise per Session: 60 min  Stress: Stress Concern Present (10/28/2022)   [employer] of Occupational Health - Occupational Stress Questionnaire    Feeling of Stress : To some extent  Social Connections: Moderately Isolated (10/28/2022)   Social Connection and Isolation Panel    Frequency of Communication with Friends and Family: More than three times a week    Frequency of Social Gatherings with Friends and Family: Once a week    Attends Religious Services: More than 4 times per year    Active Member of [employer] or Organizations: No    Attends Banker Meetings: Never    Marital Status: Widowed  Intimate Partner Violence: Not At Risk (  10/28/2022)   Humiliation, Afraid, Rape, and Kick questionnaire    Fear of Current or Ex-Partner: No    Emotionally Abused: No    Physically Abused: No    Sexually Abused: No   Social History   Tobacco Use  Smoking Status Never   Passive exposure: Never  Smokeless Tobacco Never   Social History   Substance and Sexual Activity  Alcohol Use Never    Family History:  Family History  Problem Relation Age of Onset   Heart disease Father 67   Tremor Father    Treacher Collins syndrome Father    Cancer Brother        throat and brain cancer   Heart disease Brother        deceased from MI   Tremor Brother    Cancer Brother        liver   Arthritis Paternal Grandmother    Heart disease Paternal Grandmother    Hypertension Paternal Grandmother    Heart disease Paternal Grandfather    Stroke Paternal Grandfather    Hypertension Paternal Grandfather    Cerebral aneurysm Son    Alcoholism Son    Healthy Son    Tremor Cousin    Tremor Paternal Aunt    Breast cancer Neg Hx     Past medical history, surgical history, medications, allergies, family history and social history reviewed with patient today and changes made to appropriate areas of the chart.   Review of Systems   Eyes:  Negative for blurred vision and double vision.  Respiratory:  Negative for shortness of breath.   Cardiovascular:  Negative for chest pain, palpitations and leg swelling.  Neurological:  Negative for dizziness, tremors and headaches.  Psychiatric/Behavioral:  Positive for depression. The patient is nervous/anxious and has insomnia.     All other ROS negative except what is listed above and in the HPI.      Objective:    BP 124/88 (BP Location: Left Arm, Cuff Size: Normal)   Pulse 78   Temp 97.9 F (36.6 C) (Oral)   Ht 5' 3.6 (1.615 m)   Wt 188 lb 9.6 oz (85.5 kg)   SpO2 96%   BMI 32.78 kg/m   Wt Readings from Last 3 Encounters:  10/31/23 188 lb 9.6 oz (85.5 kg)  04/26/23 194 lb 6.4 oz (88.2 kg)  01/18/23 192 lb 6.4 oz (87.3 kg)    No results found.   Physical Exam Vitals and nursing note reviewed.  Constitutional:      General: She is awake. She is not in acute distress.    Appearance: Normal appearance. She is well-developed. She is obese. She is not ill-appearing.  HENT:     Head: Normocephalic and atraumatic.     Right Ear: Hearing, tympanic membrane, ear canal and external ear normal. No drainage.     Left Ear: Hearing, tympanic membrane, ear canal and external ear normal. No drainage.     Nose: Nose normal.     Right Sinus: No maxillary sinus tenderness or frontal sinus tenderness.     Left Sinus: No maxillary sinus tenderness or frontal sinus tenderness.     Mouth/Throat:     Mouth: Mucous membranes are moist.     Pharynx: Oropharynx is clear. Uvula midline. No pharyngeal swelling, oropharyngeal exudate or posterior oropharyngeal erythema.  Eyes:     General: Lids are normal.        Right eye: No discharge.  Left eye: No discharge.     Extraocular Movements: Extraocular movements intact.     Conjunctiva/sclera: Conjunctivae normal.     Pupils: Pupils are equal, round, and reactive to light.     Visual Fields: Right eye visual fields normal and  left eye visual fields normal.  Neck:     Thyroid : No thyromegaly.     Vascular: No carotid bruit.     Trachea: Trachea normal.  Cardiovascular:     Rate and Rhythm: Normal rate and regular rhythm.     Heart sounds: Normal heart sounds. No murmur heard.    No gallop.  Pulmonary:     Effort: Pulmonary effort is normal. No accessory muscle usage or respiratory distress.     Breath sounds: Normal breath sounds.  Chest:  Breasts:    Right: Normal.     Left: Normal.  Abdominal:     General: Bowel sounds are normal.     Palpations: Abdomen is soft. There is no hepatomegaly or splenomegaly.     Tenderness: There is no abdominal tenderness.  Musculoskeletal:        General: Normal range of motion.     Cervical back: Normal range of motion and neck supple.     Right lower leg: No edema.     Left lower leg: No edema.  Lymphadenopathy:     Head:     Right side of head: No submental, submandibular, tonsillar, preauricular or posterior auricular adenopathy.     Left side of head: No submental, submandibular, tonsillar, preauricular or posterior auricular adenopathy.     Cervical: No cervical adenopathy.     Upper Body:     Right upper body: No supraclavicular, axillary or pectoral adenopathy.     Left upper body: No supraclavicular, axillary or pectoral adenopathy.  Skin:    General: Skin is warm and dry.     Capillary Refill: Capillary refill takes less than 2 seconds.     Findings: No rash.  Neurological:     Mental Status: She is alert and oriented to person, place, and time.     Gait: Gait is intact.     Deep Tendon Reflexes: Reflexes are normal and symmetric.     Reflex Scores:      Brachioradialis reflexes are 2+ on the right side and 2+ on the left side.      Patellar reflexes are 2+ on the right side and 2+ on the left side. Psychiatric:        Attention and Perception: Attention normal.        Mood and Affect: Mood normal.        Speech: Speech normal.        Behavior:  Behavior normal. Behavior is cooperative.        Thought Content: Thought content normal.        Judgment: Judgment normal.     Results for orders placed or performed in visit on 04/26/23  Comprehensive metabolic panel with GFR   Collection Time: 04/26/23  9:21 AM  Result Value Ref Range   Glucose 67 (L) 70 - 99 mg/dL   BUN 13 6 - 24 mg/dL   Creatinine, Ser 9.35 0.57 - 1.00 mg/dL   eGFR 897 >40 fO/fpw/8.26   BUN/Creatinine Ratio 20 9 - 23   Sodium 143 134 - 144 mmol/L   Potassium 4.1 3.5 - 5.2 mmol/L   Chloride 105 96 - 106 mmol/L   CO2 24 20 - 29 mmol/L  Calcium 9.1 8.7 - 10.2 mg/dL   Total Protein 6.7 6.0 - 8.5 g/dL   Albumin 4.2 3.8 - 4.9 g/dL   Globulin, Total 2.5 1.5 - 4.5 g/dL   Bilirubin Total 0.3 0.0 - 1.2 mg/dL   Alkaline Phosphatase 111 44 - 121 IU/L   AST 17 0 - 40 IU/L   ALT 11 0 - 32 IU/L  Lipid panel   Collection Time: 04/26/23  9:21 AM  Result Value Ref Range   Cholesterol, Total 220 (H) 100 - 199 mg/dL   Triglycerides 79 0 - 149 mg/dL   HDL 73 >60 mg/dL   VLDL Cholesterol Cal 14 5 - 40 mg/dL   LDL Chol Calc (NIH) 866 (H) 0 - 99 mg/dL   Chol/HDL Ratio 3.0 0.0 - 4.4 ratio      Assessment & Plan:   Problem List Items Addressed This Visit       Cardiovascular and Mediastinum   Primary hypertension   Chronic.  Controlled.  Continue with current medication regimen of Valsartan  40mg . Refills sent today.  Labs ordered today.  Return to clinic in 6 months for reevaluation.  Call sooner if concerns arise.        Relevant Medications   valsartan  (DIOVAN ) 40 MG tablet     Nervous and Auditory   Essential tremor   Underwent deep brain stimulation in August 2025.  Doing well.  No longer on Seroquel , Gabapentin or Klonopin.  Reviewed recent note.  Continue to follow up with Neurology.          Other   Insomnia   Chronic.  Controlled.  Continue with current medication regimen of Mirtazipine 30mg  daily.  Refills sent today. She is no longer on Seroquel  or  Gabapentin.  Labs ordered today.  Return to clinic in 6 months for reevaluation.  Call sooner if concerns arise.        Hypercholesteremia   Labs ordered at visit today.  Will make recommendations based on lab results.   The 10-year ASCVD risk score (Arnett DK, et al., 2019) is: 3.3%   Values used to calculate the score:     Age: 68 years     Clincally relevant sex: Female     Is Non-Hispanic African American: No     Diabetic: No     Tobacco smoker: No     Systolic Blood Pressure: 124 mmHg     Is BP treated: Yes     HDL Cholesterol: 73 mg/dL     Total Cholesterol: 220 mg/dL       Relevant Medications   valsartan  (DIOVAN ) 40 MG tablet   Other Relevant Orders   Lipid panel   Other Visit Diagnoses       Annual physical exam    -  Primary   Relevant Orders   CBC with Differential/Platelet   Comprehensive metabolic panel with GFR   Lipid panel   TSH     Need for influenza vaccination       Relevant Orders   Flu vaccine trivalent PF, 6mos and older(Flulaval,Afluria,Fluarix,Fluzone) (Completed)     Need for pneumococcal 20-valent conjugate vaccination       Relevant Orders   Pneumococcal conjugate vaccine 20-valent (Prevnar 20)     Screening for colon cancer       Relevant Orders   Ambulatory referral to Gastroenterology        Follow up plan: Return in about 6 months (around 04/30/2024) for HTN, HLD, DM2 FU.  LABORATORY TESTING:  - Pap smear: up to date  IMMUNIZATIONS:   - Tdap: Tetanus vaccination status reviewed: last tetanus booster within 10 years. - Influenza: Up to date - Pneumovax:  Given today - Prevnar: Not applicable - Zostavax vaccine: Up to date  SCREENING: -Mammogram: Up to date  - Colonoscopy: Up to date - due next month. Referral placed - Bone Density: Not applicable  -Hearing Test: Not applicable  -Spirometry: Not applicable   PATIENT COUNSELING:   Advised to take 1 mg of folate supplement per day if capable of pregnancy.   Sexuality:  Discussed sexually transmitted diseases, partner selection, use of condoms, avoidance of unintended pregnancy  and contraceptive alternatives.   Advised to avoid cigarette smoking.  I discussed with the patient that most people either abstain from alcohol or drink within safe limits (<=14/week and <=4 drinks/occasion for males, <=7/weeks and <= 3 drinks/occasion for females) and that the risk for alcohol disorders and other health effects rises proportionally with the number of drinks per week and how often a drinker exceeds daily limits.  Discussed cessation/primary prevention of drug use and availability of treatment for abuse.   Diet: Encouraged to adjust caloric intake to maintain  or achieve ideal body weight, to reduce intake of dietary saturated fat and total fat, to limit sodium intake by avoiding high sodium foods and not adding table salt, and to maintain adequate dietary potassium and calcium preferably from fresh fruits, vegetables, and low-fat dairy products.    stressed the importance of regular exercise  Injury prevention: Discussed safety belts, safety helmets, smoke detector, smoking near bedding or upholstery.   Dental health: Discussed importance of regular tooth brushing, flossing, and dental visits.    NEXT PREVENTATIVE PHYSICAL DUE IN 1 YEAR. Return in about 6 months (around 04/30/2024) for HTN, HLD, DM2 FU.

## 2023-10-31 NOTE — Assessment & Plan Note (Signed)
 Chronic.  Controlled.  Continue with current medication regimen of Mirtazipine 30mg  daily.  Refills sent today. She is no longer on Seroquel  or Gabapentin.  Labs ordered today.  Return to clinic in 6 months for reevaluation.  Call sooner if concerns arise.

## 2023-10-31 NOTE — Assessment & Plan Note (Signed)
Chronic.  Controlled.  Continue with current medication regimen of Valsartan 40mg .  Refills sent today.  Labs ordered today.  Return to clinic in 6 months for reevaluation.  Call sooner if concerns arise.

## 2023-10-31 NOTE — Assessment & Plan Note (Signed)
 Labs ordered at visit today.  Will make recommendations based on lab results.   The 10-year ASCVD risk score (Arnett DK, et al., 2019) is: 3.3%   Values used to calculate the score:     Age: 58 years     Clincally relevant sex: Female     Is Non-Hispanic African American: No     Diabetic: No     Tobacco smoker: No     Systolic Blood Pressure: 124 mmHg     Is BP treated: Yes     HDL Cholesterol: 73 mg/dL     Total Cholesterol: 220 mg/dL

## 2023-10-31 NOTE — Telephone Encounter (Signed)
 Requested Prescriptions  Refused Prescriptions Disp Refills   busPIRone  (BUSPAR ) 5 MG tablet [Pharmacy Med Name: busPIRone  HCl 5 MG Oral Tablet] 180 tablet 0    Sig: Take 1 tablet by mouth twice daily     Psychiatry: Anxiolytics/Hypnotics - Non-controlled Passed - 10/31/2023  3:12 PM      Passed - Valid encounter within last 12 months    Recent Outpatient Visits           Today Annual physical exam   [place] [facility] Melvin Pao, NP   6 months ago Mild episode of recurrent major depressive disorder   [place] [facility] Melvin Pao, NP               escitalopram  (LEXAPRO ) 20 MG tablet [Pharmacy Med Name: Escitalopram  Oxalate 20 MG Oral Tablet] 90 tablet 0    Sig: Take 1 tablet by mouth once daily     Psychiatry:  Antidepressants - SSRI Passed - 10/31/2023  3:12 PM      Passed - Completed PHQ-2 or PHQ-9 in the last 360 days      Passed - Valid encounter within last 6 months    Recent Outpatient Visits           Today Annual physical exam   [facility] Melvin Pao, NP   6 months ago Mild episode of recurrent major depressive disorder    [facility] Melvin Pao, NP

## 2023-10-31 NOTE — Telephone Encounter (Signed)
 Refused this refill - already refilled today.

## 2023-11-01 ENCOUNTER — Ambulatory Visit: Payer: Self-pay | Admitting: Nurse Practitioner

## 2023-11-01 LAB — COMPREHENSIVE METABOLIC PANEL WITH GFR
ALT: 10 IU/L (ref 0–32)
AST: 22 IU/L (ref 0–40)
Albumin: 4.5 g/dL (ref 3.8–4.9)
Alkaline Phosphatase: 113 IU/L (ref 49–135)
BUN/Creatinine Ratio: 19 (ref 9–23)
BUN: 15 mg/dL (ref 6–24)
Bilirubin Total: 0.4 mg/dL (ref 0.0–1.2)
CO2: 22 mmol/L (ref 20–29)
Calcium: 9.4 mg/dL (ref 8.7–10.2)
Chloride: 104 mmol/L (ref 96–106)
Creatinine, Ser: 0.77 mg/dL (ref 0.57–1.00)
Globulin, Total: 2.3 g/dL (ref 1.5–4.5)
Glucose: 71 mg/dL (ref 70–99)
Potassium: 4.6 mmol/L (ref 3.5–5.2)
Sodium: 140 mmol/L (ref 134–144)
Total Protein: 6.8 g/dL (ref 6.0–8.5)
eGFR: 89 mL/min/1.73 (ref >59)

## 2023-11-01 LAB — CBC WITH DIFFERENTIAL/PLATELET
Basophils Absolute: 0 x10E3/uL (ref 0.0–0.2)
Basos: 1 %
EOS (ABSOLUTE): 0.1 x10E3/uL (ref 0.0–0.4)
Eos: 2 %
Hematocrit: 44.4 % (ref 34.0–46.6)
Hemoglobin: 14.5 g/dL (ref 11.1–15.9)
Immature Grans (Abs): 0 x10E3/uL (ref 0.0–0.1)
Immature Granulocytes: 0 %
Lymphocytes Absolute: 1.7 x10E3/uL (ref 0.7–3.1)
Lymphs: 33 %
MCH: 30.7 pg (ref 26.6–33.0)
MCHC: 32.7 g/dL (ref 31.5–35.7)
MCV: 94 fL (ref 79–97)
Monocytes Absolute: 0.5 x10E3/uL (ref 0.1–0.9)
Monocytes: 9 %
Neutrophils Absolute: 2.8 x10E3/uL (ref 1.4–7.0)
Neutrophils: 55 %
Platelets: 297 x10E3/uL (ref 150–450)
RBC: 4.72 x10E6/uL (ref 3.77–5.28)
RDW: 12.6 % (ref 11.7–15.4)
WBC: 5.1 x10E3/uL (ref 3.4–10.8)

## 2023-11-01 LAB — TSH: TSH: 1.15 u[IU]/mL (ref 0.450–4.500)

## 2023-11-01 LAB — LIPID PANEL
Chol/HDL Ratio: 3.5 ratio (ref 0.0–4.4)
Cholesterol, Total: 234 mg/dL — ABNORMAL HIGH (ref 100–199)
HDL: 66 mg/dL (ref >39)
LDL Chol Calc (NIH): 153 mg/dL — ABNORMAL HIGH (ref 0–99)
Triglycerides: 86 mg/dL (ref 0–149)
VLDL Cholesterol Cal: 15 mg/dL (ref 5–40)

## 2023-11-04 ENCOUNTER — Telehealth: Payer: Self-pay

## 2023-11-04 NOTE — Telephone Encounter (Signed)
 LVM for pt to return call  Purpose of call was to schedule pt for a 1 year follow up with Dr.MacDiarmid for more refills.

## 2023-11-10 ENCOUNTER — Encounter

## 2023-11-11 DIAGNOSIS — Z9689 Presence of other specified functional implants: Secondary | ICD-10-CM | POA: Diagnosis not present

## 2023-11-28 ENCOUNTER — Telehealth: Payer: Self-pay

## 2023-11-28 ENCOUNTER — Other Ambulatory Visit: Payer: Self-pay

## 2023-11-28 DIAGNOSIS — Z860100 Personal history of colon polyps, unspecified: Secondary | ICD-10-CM

## 2023-11-28 MED ORDER — NA SULFATE-K SULFATE-MG SULF 17.5-3.13-1.6 GM/177ML PO SOLN: 1.0000 | Freq: Once | ORAL | 0 refills | Status: AC

## 2023-11-28 NOTE — Telephone Encounter (Signed)
 Gastroenterology Pre-Procedure Review  Request Date: 03/01/24 Requesting Physician: Dr. Jinny  PATIENT REVIEW QUESTIONS: The patient responded to the following health history questions as indicated:    1. Are you having any GI issues? no 2. Do you have a personal history of Polyps? yes (last colonoscopy performed by Dr. Unk 11/10/20 recommended repeat in 3 years) 3. Do you have a family history of Colon Cancer or Polyps? no 4. Diabetes Mellitus? no 5. Joint replacements in the past 12 months?no 6. Major health problems in the past 3 months?no 7. Any artificial heart valves, MVP, or defibrillator?no  8. Neurology clearance sent to [facility]ist Bernice Anette Kins MEDICATIONS & ALLERGIES:    Patient reports the following regarding taking any anticoagulation/antiplatelet therapy:   Plavix, Coumadin, Eliquis, Xarelto, Lovenox, Pradaxa, Brilinta, or Effient? no Aspirin? no    Patient confirms/reports the following medications:  Current Outpatient Medications  Medication Sig Dispense Refill   Na Sulfate-K Sulfate-Mg Sulfate concentrate (SUPREP) 17.5-3.13-1.6 GM/177ML SOLN Take 1 kit (354 mLs total) by mouth once for 1 dose. 354 mL 0   busPIRone  (BUSPAR ) 5 MG tablet Take 1 tablet (5 mg total) by mouth 2 (two) times daily. 180 tablet 0   escitalopram  (LEXAPRO ) 20 MG tablet Take 1 tablet (20 mg total) by mouth daily. 90 tablet 0   mirabegron  ER (MYRBETRIQ ) 50 MG TB24 tablet Take 1 tablet (50 mg total) by mouth daily. 90 tablet 3   mirtazapine  (REMERON ) 30 MG tablet Take 1 tablet (30 mg total) by mouth at bedtime. 90 tablet 1   omeprazole  (PRILOSEC ) 20 MG capsule Take 1 capsule (20 mg total) by mouth 2 (two) times daily before a meal. Take 1 cap by mouth twice daily 180 capsule 1   valsartan  (DIOVAN ) 40 MG tablet Take 1 tablet (40 mg total) by mouth daily. 90 tablet 1   No current facility-administered medications for this visit.    Patient confirms/reports the following allergies:   Allergies  Allergen Reactions   Vicodin [Hydrocodone -Acetaminophen ] Nausea And Vomiting    No orders of the defined types were placed in this encounter.   AUTHORIZATION INFORMATION Primary Insurance: 1D#: Group #:  Secondary Insurance: 1D#: Group #:  SCHEDULE INFORMATION: Date:  Time: Location:

## 2024-01-03 ENCOUNTER — Ambulatory Visit (INDEPENDENT_AMBULATORY_CARE_PROVIDER_SITE_OTHER): Admitting: Emergency Medicine

## 2024-01-03 VITALS — BP 128/84 | Ht 64.0 in | Wt 195.0 lb

## 2024-01-03 DIAGNOSIS — Z0000 Encounter for general adult medical examination without abnormal findings: Secondary | ICD-10-CM

## 2024-01-03 DIAGNOSIS — Z1231 Encounter for screening mammogram for malignant neoplasm of breast: Secondary | ICD-10-CM | POA: Diagnosis not present

## 2024-01-03 NOTE — Patient Instructions (Addendum)
 Cynthia Sellers,  Thank you for taking the time for your Medicare Wellness Visit. I appreciate your continued commitment to your health goals. Please review the care plan we discussed, and feel free to reach out if I can assist you further.  Please note that Annual Wellness Visits do not include a physical exam. Some assessments may be limited, especially if the visit was conducted virtually. If needed, we may recommend an in-person follow-up with your provider.  Ongoing Care Seeing your primary care provider every 3 to 6 months helps us  monitor your health and provide consistent, personalized care.   Referrals If a referral was made during today's visit and you haven't received any updates within two weeks, please contact the referred provider directly to check on the status.  Recommended Screenings:  I have included a list of eye doctors in the area for you to schedule a routine eye exam at your earliest convenience.  Please call to schedule your mammogram:  [facility] Address: [address], [place], [place] Phone: [phone]  [facility] [address], [address] [place], [place] [address] Phone: [phone]   Health Maintenance  Topic Date Due   COVID-19 Vaccine (3 - Pfizer risk series) 05/31/2019   Medicare Annual Wellness Visit  10/28/2023   Colon Cancer Screening  11/11/2023   Breast Cancer Screening  11/24/2023   DTaP/Tdap/Td vaccine (3 - Td or Tdap) 09/16/2030   Pneumococcal Vaccine for age over 34  Completed   Flu Shot  Completed   Hepatitis C Screening  Completed   HIV Screening  Completed   Zoster (Shingles) Vaccine  Completed   Hepatitis B Vaccine  Aged Out   HPV Vaccine  Aged Out   Meningitis B Vaccine  Aged Out       01/03/2024   10:00 AM  Advanced Directives  Does Patient Have a Medical Advance Directive? No  Would patient like information on creating a medical advance directive?  No - Patient declined    Vision: Annual vision screenings are recommended for early detection of glaucoma, cataracts, and diabetic retinopathy. These exams can also reveal signs of chronic conditions such as diabetes and high blood pressure.   Dental: Annual dental screenings help detect early signs of oral cancer, gum disease, and other conditions linked to overall health, including heart disease and diabetes.  Please see the attached documents for additional preventive care recommendations.   There are several Eye Doctors in your area. Here are a few that usually accept all insurance types:  [facility] [address] [place], [place] [address] Phone: [phone]  [facility] [address] [place], [place] [address] Phone: [phone]  [facility] [address] [place], [place] [address] Phone: [phone]  [facility] [address] [place], [place] [address] Phone: [phone]  [facility] [address] [place], [place] [address] Phone: [phone]  [facility] [address] [place], [place] [address] Phone: [phone]

## 2024-01-03 NOTE — Progress Notes (Signed)
 "  Chief Complaint  Patient presents with   Medicare Wellness     Subjective:   Cynthia Sellers is a 58 y.o. female who presents for a Medicare Annual Wellness Visit.  Visit info / Clinical Intake: Medicare Wellness Visit Type:: Subsequent Annual Wellness Visit Persons participating in visit and providing information:: patient Medicare Wellness Visit Mode:: In-person (required for WTM) Interpreter Needed?: No Pre-visit prep was completed: yes AWV questionnaire completed by patient prior to visit?: no Living arrangements:: (!) lives alone Patient's Overall Health Status Rating: very good Typical amount of pain: none Does pain affect daily life?: no Are you currently prescribed opioids?: no  Dietary Habits and Nutritional Risks How many meals a day?: 2 Eats fruit and vegetables daily?: (!) no Most meals are obtained by: preparing own meals In the last 2 weeks, have you had any of the following?: (!) nausea, vomiting, diarrhea (diarrhea) Diabetic:: no  Functional Status Activities of Daily Living (to include ambulation/medication): Independent Ambulation: Independent Medication Administration: Independent Home Management (perform basic housework or laundry): Independent Manage your own finances?: yes Primary transportation is: driving Concerns about vision?: (!) yes (needs eye exam) Concerns about hearing?: no  Fall Screening Falls in the past year?: 0 Number of falls in past year: 0 Was there an injury with Fall?: 0 Fall Risk Category Calculator: 0 Patient Fall Risk Level: Low Fall Risk  Fall Risk Patient at Risk for Falls Due to: No Fall Risks Fall risk Follow up: Falls evaluation completed  Home and Transportation Safety: All rugs have non-skid backing?: (!) no All stairs or steps have railings?: (!) no (1 step without handrail) Grab bars in the bathtub or shower?: yes Have non-skid surface in bathtub or shower?: yes Good home lighting?: yes Regular seat  belt use?: yes Hospital stays in the last year:: (!) yes How many hospital stays:: 1 Reason: neurosurgery (stimulator in brain)  Cognitive Assessment Difficulty concentrating, remembering, or making decisions? : no Will 6CIT or Mini Cog be Completed: yes What year is it?: 0 points What month is it?: 0 points Give patient an address phrase to remember (5 components): [address] [place] About what time is it?: 0 points Count backwards from 20 to 1: 0 points Say the months of the year in reverse: 0 points Repeat the address phrase from earlier: 0 points 6 CIT Score: 0 points  Advance Directives (For Healthcare) Does Patient Have a Medical Advance Directive?: No Would patient like information on creating a medical advance directive?: No - Patient declined  Reviewed/Updated  Reviewed/Updated: Reviewed All (Medical, Surgical, Family, Medications, Allergies, Care Teams, Patient Goals)    Allergies (verified) Vicodin [hydrocodone -acetaminophen ]   Current Medications (verified) Outpatient Encounter Medications as of 01/03/2024  Medication Sig   busPIRone  (BUSPAR ) 5 MG tablet Take 1 tablet (5 mg total) by mouth 2 (two) times daily.   escitalopram  (LEXAPRO ) 20 MG tablet Take 1 tablet (20 mg total) by mouth daily.   mirabegron  ER (MYRBETRIQ ) 50 MG TB24 tablet Take 1 tablet (50 mg total) by mouth daily.   mirtazapine  (REMERON ) 30 MG tablet Take 1 tablet (30 mg total) by mouth at bedtime.   omeprazole  (PRILOSEC ) 20 MG capsule Take 1 capsule (20 mg total) by mouth 2 (two) times daily before a meal. Take 1 cap by mouth twice daily   valsartan  (DIOVAN ) 40 MG tablet Take 1 tablet (40 mg total) by mouth daily.   No facility-administered encounter medications on file as of 01/03/2024.  History: Past Medical History:  Diagnosis Date   Adenomatous polyp of ascending colon    Breast lipoma 07/01/2017   Complication of anesthesia    Essential tremor 09/11/2019   Gastric erosion     Generalized anxiety disorder 09/11/2019   GERD (gastroesophageal reflux disease)    Headache, tension type, episodic    Excedrin tension headache helps   Insomnia 09/15/2020   Major depressive disorder 09/11/2019   Mild cognitive impairment 07/28/2022   Obesity (BMI 30.0-34.9) 09/11/2019   Polyp of descending colon    PONV (postoperative nausea and vomiting)    with hysterectomy   Primary hypertension 05/19/2021   Suicide attempt 1992   overdose of sleeping pills   Urge urinary incontinence 09/11/2019   Past Surgical History:  Procedure Laterality Date   BILATERAL SALPINGOOPHORECTOMY  01/04/2010   COLONOSCOPY WITH PROPOFOL  N/A 11/10/2020   Procedure: COLONOSCOPY WITH PROPOFOL ;  Surgeon: Unk Corinn Skiff, MD;  Location: [facility] ENDOSCOPY;  Service: Gastroenterology;  Laterality: N/A;   DEEP BRAIN STIMULATOR PLACEMENT  08/10/2023   ESOPHAGOGASTRODUODENOSCOPY N/A 11/10/2020   Procedure: ESOPHAGOGASTRODUODENOSCOPY (EGD);  Surgeon: Unk Corinn Skiff, MD;  Location: [facility] ENDOSCOPY;  Service: Gastroenterology;  Laterality: N/A;   HEEL SPUR EXCISION Left 01/05/2007   LIPOMA EXCISION Left 07/13/2017   Procedure: EXCISION LIPOMA-LEFT BREAST;  Surgeon: Dessa Reyes ORN, MD;  Location: [facility] ORS;  Service: General;  Laterality: Left;   SUBMANDIBULAR GLAND EXCISION Right 06/10/2021   Procedure: EXCISION SUBMANDIBULAR GLAND;  Surgeon: Blair Mt, MD;  Location: [facility] ORS;  Service: ENT;  Laterality: Right;   TOTAL ABDOMINAL HYSTERECTOMY W/ BILATERAL SALPINGOOPHORECTOMY  01/04/2010   Family History  Problem Relation Age of Onset   Heart disease Father 41   Tremor Father    Treacher Collins syndrome Father    Cancer Brother        throat and brain cancer   Heart disease Brother        deceased from MI   Tremor Brother    Cancer Brother        liver   Arthritis Paternal Grandmother    Heart disease Paternal Grandmother    Hypertension Paternal Grandmother    Heart disease Paternal  Grandfather    Stroke Paternal Grandfather    Hypertension Paternal Grandfather    Cerebral aneurysm Son    Alcoholism Son    Healthy Son    Tremor Cousin    Tremor Paternal Aunt    Breast cancer Neg Hx    Social History   Occupational History   Occupation: Disability  Tobacco Use   Smoking status: Never    Passive exposure: Never   Smokeless tobacco: Never  Vaping Use   Vaping status: Never Used  Substance and Sexual Activity   Alcohol use: Never   Drug use: Never   Sexual activity: Not Currently    Birth control/protection: Surgical   Tobacco Counseling Counseling given: Not Answered  SDOH Screenings   Food Insecurity: No Food Insecurity (01/03/2024)  Housing: Low Risk (01/03/2024)  Transportation Needs: No Transportation Needs (01/03/2024)  Utilities: Not At Risk (01/03/2024)  Alcohol Screen: Low Risk (10/28/2022)  Depression (PHQ2-9): Low Risk (01/03/2024)  Recent Concern: Depression (PHQ2-9) - Medium Risk (10/31/2023)  Financial Resource Strain: Medium Risk (08/16/2023)   [facility] from [facility]  Physical Activity: Inactive (01/03/2024)  Social Connections: Moderately Integrated (01/03/2024)  Stress: Stress Concern Present (01/03/2024)  Tobacco Use: Low Risk (01/03/2024)  Health Literacy: Adequate Health Literacy (01/03/2024)   See flowsheets for  full screening details  Depression Screen PHQ 2 & 9 Depression Scale- Over the past 2 weeks, how often have you been bothered by any of the following problems? Little interest or pleasure in doing things: 0 Feeling down, depressed, or hopeless (PHQ Adolescent also includes...irritable): 1 PHQ-2 Total Score: 1 Trouble falling or staying asleep, or sleeping too much: 1 Feeling tired or having little energy: 0 Poor appetite or overeating (PHQ Adolescent also includes...weight loss): 1 Feeling bad about yourself - or that you are a failure or have let yourself or your family down: 0 Trouble  concentrating on things, such as reading the newspaper or watching television (PHQ Adolescent also includes...like school work): 0 Moving or speaking so slowly that other people could have noticed. Or the opposite - being so fidgety or restless that you have been moving around a lot more than usual: 0 Thoughts that you would be better off dead, or of hurting yourself in some way: 0 PHQ-9 Total Score: 3 If you checked off any problems, how difficult have these problems made it for you to do your work, take care of things at home, or get along with other people?: Somewhat difficult  Depression Treatment Depression Interventions/Treatment : EYV7-0 Score <4 Follow-up Not Indicated; Currently on Treatment     Goals Addressed               This Visit's Progress     lose weight (pt-stated)               Objective:    Today's Vitals   01/03/24 0955  BP: 128/84  Weight: 195 lb (88.5 kg)  Height: 5' 4 (1.626 m)   Body mass index is 33.47 kg/m.  Hearing/Vision screen Hearing Screening - Comments:: Denies hearing loss  Vision Screening - Comments:: Needs routine eye exam. Included a list of eye doctors in AVS Immunizations and Health Maintenance Health Maintenance  Topic Date Due   COVID-19 Vaccine (3 - Pfizer risk series) 05/31/2019   Colonoscopy  11/11/2023   Mammogram  11/24/2023   Medicare Annual Wellness (AWV)  01/02/2025   DTaP/Tdap/Td (3 - Td or Tdap) 09/16/2030   Pneumococcal Vaccine: 50+ Years  Completed   Influenza Vaccine  Completed   Hepatitis C Screening  Completed   HIV Screening  Completed   Zoster Vaccines- Shingrix   Completed   Hepatitis B Vaccines 19-59 Average Risk  Aged Out   HPV VACCINES  Aged Out   Meningococcal B Vaccine  Aged Out        Assessment/Plan:  This is a routine wellness examination for [place].  Patient Care Team: Melvin Pao, NP as PCP - General (Nurse Practitioner) Lewanda Arthea Herald, NP (Neurosurgery) Jacumin, Erika  Leigh, [place] (Neurology) Vickey Mettle, MD as Consulting Physician (Psychiatry)  I have personally reviewed and noted the following in the patients chart:   Medical and social history Use of alcohol, tobacco or illicit drugs  Current medications and supplements including opioid prescriptions. Functional ability and status Nutritional status Physical activity Advanced directives List of other physicians Hospitalizations, surgeries, and ER visits in previous 12 months Vitals Screenings to include cognitive, depression, and falls Referrals and appointments  Orders Placed This Encounter  Procedures   MM 3D SCREENING MAMMOGRAM BILATERAL BREAST    Standing Status:   Future    Expected Date:   01/04/2024    Expiration Date:   01/02/2025    Reason for Exam (SYMPTOM  OR DIAGNOSIS REQUIRED):   screening for breast  cancer    Preferred imaging location?:   [facility]   In addition, I have reviewed and discussed with patient certain preventive protocols, quality metrics, and best practice recommendations. A written personalized care plan for preventive services as well as general preventive health recommendations were provided to patient.   Vina Ned, CMA   01/03/2024   Return in 1 year (on 01/03/2025) for Medicare Annual Wellness Visit.  After Visit Summary: (In Person-Printed) AVS printed and given to the patient  Nurse Notes:  Placed order for [facility] Needs routine eye exam. Included list of eye doctors in AVS Declined Covid vaccine "

## 2024-01-06 NOTE — Telephone Encounter (Signed)
 Neurologist has requested procedure clearance to come from patient's PCP. Per advice received on 11/28/23.  Clearance has been sent to PCP Darice Petty.  Thanks,  [place], CMA

## 2024-01-16 ENCOUNTER — Encounter: Payer: Self-pay | Admitting: Nurse Practitioner

## 2024-01-16 ENCOUNTER — Ambulatory Visit (INDEPENDENT_AMBULATORY_CARE_PROVIDER_SITE_OTHER): Admitting: Nurse Practitioner

## 2024-01-16 VITALS — BP 133/87 | HR 73 | Temp 97.7°F | Ht 64.02 in | Wt 196.2 lb

## 2024-01-16 DIAGNOSIS — Z01818 Encounter for other preprocedural examination: Secondary | ICD-10-CM | POA: Diagnosis not present

## 2024-01-16 NOTE — Progress Notes (Signed)
 "   BP 133/87 (BP Location: Left Arm, Cuff Size: Normal)   Pulse 73   Temp 97.7 F (36.5 C) (Oral)   Ht 5' 4.02 (1.626 m)   Wt 196 lb 3.2 oz (89 kg)   SpO2 97%   BMI 33.66 kg/m    Subjective:    Patient ID: Cynthia Sellers, female    DOB: 1965/11/06, 59 y.o.   MRN: [id number]  HPI: Cynthia Sellers is a 59 y.o. female  Chief Complaint  Patient presents with   Medical Clearance   01/16/2024  Assessment:  Preoperative evaluation and clearance show: No contraindications to planned surgery and patient is cleared for diagnosis for surgical procedure.  Other diagnoses affecting surgical risk include: Other Colonoscopy   Type of Surgery: Low, <1% perioperative cardiac risk (cataract, minor head & neck, minor prostate, superficial surgeries)   Lee's Revised Cardiac Index: 0 Risk class I, very low, 0.4% risk of cardiac complications  Plan:  1. Patient requires endocarditis prophylaxis: no. 2. GENERAL PREOP INSTRUCTIONS: Proceed with surgery as planned. No food or liquids the morning of surgery. Call surgeon if develops respiratory illness, fever, or other illness. 3. Medications to Hold: NA for 7 days before surgery, unless instructed otherwise by surgeon. 4. EKG:  01/16/2024 : No acute ST changes noted  5. Ordered Labs: CMP, CBC, Lipid and TSH  From a medical standpoint the patient is an acceptable candidates to undergo general anesthesia for surgery.  It is recommended to correct electrolytes and to keep the patient euvolemic and avoid significant fluid shifts during surgery.  Labs reviewed and pt is cleared for surgery. Pt denies a hx of adverse reactions to anesthesia.    BEADIE Sellers is a 59 y.o. female who presents to the office today for a preoperative consultation at the request of Dr. Jinny, who will perform a  Colonoscopy on 03/01/24.  Current Complaints: none  Past Medical History:  Diagnosis Date   Adenomatous polyp of ascending colon    Breast  lipoma 07/01/2017   Complication of anesthesia    Essential tremor 09/11/2019   Gastric erosion    Generalized anxiety disorder 09/11/2019   GERD (gastroesophageal reflux disease)    Headache, tension type, episodic    Excedrin tension headache helps   Insomnia 09/15/2020   Major depressive disorder 09/11/2019   Mild cognitive impairment 07/28/2022   Obesity (BMI 30.0-34.9) 09/11/2019   Polyp of descending colon    PONV (postoperative nausea and vomiting)    with hysterectomy   Primary hypertension 05/19/2021   Suicide attempt 1992   overdose of sleeping pills   Urge urinary incontinence 09/11/2019   Family History  Problem Relation Age of Onset   Heart disease Father 72   Tremor Father    Treacher Collins syndrome Father    Cancer Brother        throat and brain cancer   Heart disease Brother        deceased from MI   Tremor Brother    Cancer Brother        liver   Arthritis Paternal Grandmother    Heart disease Paternal Grandmother    Hypertension Paternal Grandmother    Heart disease Paternal Grandfather    Stroke Paternal Grandfather    Hypertension Paternal Grandfather    Cerebral aneurysm Son    Alcoholism Son    Healthy Son    Tremor Cousin    Tremor Paternal Aunt    Breast cancer Neg Hx  Current Outpatient Medications  Medication Sig Dispense Refill   busPIRone  (BUSPAR ) 5 MG tablet Take 1 tablet (5 mg total) by mouth 2 (two) times daily. 180 tablet 0   escitalopram  (LEXAPRO ) 20 MG tablet Take 1 tablet (20 mg total) by mouth daily. 90 tablet 0   mirabegron  ER (MYRBETRIQ ) 50 MG TB24 tablet Take 1 tablet (50 mg total) by mouth daily. 90 tablet 3   mirtazapine  (REMERON ) 30 MG tablet Take 1 tablet (30 mg total) by mouth at bedtime. 90 tablet 1   omeprazole  (PRILOSEC ) 20 MG capsule Take 1 capsule (20 mg total) by mouth 2 (two) times daily before a meal. Take 1 cap by mouth twice daily 180 capsule 1   valsartan  (DIOVAN ) 40 MG tablet Take 1 tablet (40 mg total)  by mouth daily. 90 tablet 1   No current facility-administered medications for this visit.   Allergies[1] Social History   Socioeconomic History   Marital status: Widowed    Spouse name: Not on file   Number of children: 2   Years of education: 10   Highest education level: 10th grade  Occupational History   Occupation: Disability  Tobacco Use   Smoking status: Never    Passive exposure: Never   Smokeless tobacco: Never  Vaping Use   Vaping status: Never Used  Substance and Sexual Activity   Alcohol use: Never   Drug use: Never   Sexual activity: Not Currently    Birth control/protection: Surgical  Other Topics Concern   Not on file  Social History Narrative   Right handed   Disability    01/03/24 2 biological children, 1 living and 1 deceased/pbt   Social Drivers of Health   Tobacco Use: Low Risk (01/16/2024)   Patient History    Smoking Tobacco Use: Never    Smokeless Tobacco Use: Never    Passive Exposure: Never  Financial Resource Strain: Medium Risk (08/16/2023)   Received from [facility]   Overall Financial Resource Strain (CARDIA)    Difficulty of Paying Living Expenses: Somewhat hard  Food Insecurity: No Food Insecurity (01/03/2024)   Epic    Worried About Running Out of Food in the Last Year: Never true    Ran Out of Food in the Last Year: Never true  Transportation Needs: No Transportation Needs (01/03/2024)   Epic    Lack of Transportation (Medical): No    Lack of Transportation (Non-Medical): No  Physical Activity: Inactive (01/03/2024)   Exercise Vital Sign    Days of Exercise per Week: 0 days    Minutes of Exercise per Session: 0 min  Stress: Stress Concern Present (01/03/2024)   [employer] of Occupational Health - Occupational Stress Questionnaire    Feeling of Stress: To some extent  Social Connections: Moderately Integrated (01/03/2024)   Social Connection and Isolation Panel    Frequency of Communication with  Friends and Family: More than three times a week    Frequency of Social Gatherings with Friends and Family: More than three times a week    Attends Religious Services: More than 4 times per year    Active Member of [employer] or Organizations: Yes    Attends Banker Meetings: More than 4 times per year    Marital Status: Widowed  Intimate Partner Violence: Not At Risk (01/03/2024)   Epic    Fear of Current or Ex-Partner: No    Emotionally Abused: No    Physically Abused: No    Sexually  Abused: No  Depression (PHQ2-9): Low Risk (01/16/2024)   Depression (PHQ2-9)    PHQ-2 Score: 3  Recent Concern: Depression (PHQ2-9) - Medium Risk (10/31/2023)   Depression (PHQ2-9)    PHQ-2 Score: 5  Alcohol Screen: Low Risk (10/28/2022)   Alcohol Screen    Last Alcohol Screening Score (AUDIT): 0  Housing: Low Risk (01/03/2024)   Epic    Unable to Pay for Housing in the Last Year: No    Number of Times Moved in the Last Year: 0    Homeless in the Last Year: No  Utilities: Not At Risk (01/03/2024)   Epic    Threatened with loss of utilities: No  Health Literacy: Adequate Health Literacy (01/03/2024)   B1300 Health Literacy    Frequency of need for help with medical instructions: Never     Preoperative Risk Factors  1. Major predictors that require intensive management and may lead to delay in or cancellation of the operative procedure unless emergent:  No Unstable coronary syndromes including unstable or severe angina or recent MI  No Decompensated heart failure including NYHA functional class 4 or worsening or new onset HF  no Significant arrhythmia including high grade AV block, symptomatic ventricular arrhythmias, supraventricular arrhythmias with a ventricular rate >100 bpm at rest, symptomatic bradycardia, and newly recognized ventricular tachycardia  no Severe heart valve disease including severe aortic stenosis or symptomatic mitral stenosis  2. Additional independent  predictors of major cardiac complications:  No Hgh risk type of surgery (Vascular surgery and any open intraperitoneal or intrathoracic procedures)  Other clinical predictors that warrant careful assessment of current status  No History of ischemic heart disease No History of CVA No  History of compensated heart failure or prior heart failure No Diabetes mellitus on Insulin No Renal insufficiency  3. Perioperative cardiac and long term risk is increased in pts unable to meet a 4-MET demand during most normal daily activities:  yes Ability to climb 2 flights of stairs or walk four blocks  Objective:  BP 133/87 (BP Location: Left Arm, Cuff Size: Normal)   Pulse 73   Temp 97.7 F (36.5 C) (Oral)   Ht 5' 4.02 (1.626 m)   Wt 196 lb 3.2 oz (89 kg)   SpO2 97%   BMI 33.66 kg/m   Body mass index is 33.66 kg/m.     Darice Petty 01/16/2024  Relevant past medical, surgical, family and social history reviewed and updated as indicated. Interim medical history since our last visit reviewed. Allergies and medications reviewed and updated.  Review of Systems  All other systems reviewed and are negative.   Per HPI unless specifically indicated above     Objective:    BP 133/87 (BP Location: Left Arm, Cuff Size: Normal)   Pulse 73   Temp 97.7 F (36.5 C) (Oral)   Ht 5' 4.02 (1.626 m)   Wt 196 lb 3.2 oz (89 kg)   SpO2 97%   BMI 33.66 kg/m   Wt Readings from Last 3 Encounters:  01/16/24 196 lb 3.2 oz (89 kg)  01/03/24 195 lb (88.5 kg)  10/31/23 188 lb 9.6 oz (85.5 kg)    Physical Exam Vitals and nursing note reviewed.  Constitutional:      General: She is awake. She is not in acute distress.    Appearance: She is well-developed. She is not ill-appearing.  HENT:     Head: Normocephalic and atraumatic.     Right Ear: Hearing, tympanic membrane, ear canal and  external ear normal. No drainage.     Left Ear: Hearing, tympanic membrane, ear canal and external ear  normal. No drainage.     Nose: Nose normal.     Right Sinus: No maxillary sinus tenderness or frontal sinus tenderness.     Left Sinus: No maxillary sinus tenderness or frontal sinus tenderness.     Mouth/Throat:     Mouth: Mucous membranes are moist.     Pharynx: Oropharynx is clear. Uvula midline. No pharyngeal swelling, oropharyngeal exudate or posterior oropharyngeal erythema.  Eyes:     General: Lids are normal.        Right eye: No discharge.        Left eye: No discharge.     Extraocular Movements: Extraocular movements intact.     Conjunctiva/sclera: Conjunctivae normal.     Pupils: Pupils are equal, round, and reactive to light.     Visual Fields: Right eye visual fields normal and left eye visual fields normal.  Neck:     Thyroid : No thyromegaly.     Vascular: No carotid bruit.     Trachea: Trachea normal.  Cardiovascular:     Rate and Rhythm: Normal rate and regular rhythm.     Heart sounds: Normal heart sounds. No murmur heard.    No gallop.  Pulmonary:     Effort: Pulmonary effort is normal. No accessory muscle usage or respiratory distress.     Breath sounds: Normal breath sounds.  Chest:  Breasts:    Right: Normal.     Left: Normal.  Abdominal:     General: Bowel sounds are normal.     Palpations: Abdomen is soft. There is no hepatomegaly or splenomegaly.     Tenderness: There is no abdominal tenderness.  Musculoskeletal:        General: Normal range of motion.     Cervical back: Normal range of motion and neck supple.     Right lower leg: No edema.     Left lower leg: No edema.  Lymphadenopathy:     Head:     Right side of head: No submental, submandibular, tonsillar, preauricular or posterior auricular adenopathy.     Left side of head: No submental, submandibular, tonsillar, preauricular or posterior auricular adenopathy.     Cervical: No cervical adenopathy.     Upper Body:     Right upper body: No supraclavicular, axillary or pectoral adenopathy.      Left upper body: No supraclavicular, axillary or pectoral adenopathy.  Skin:    General: Skin is warm and dry.     Capillary Refill: Capillary refill takes less than 2 seconds.     Findings: No rash.  Neurological:     Mental Status: She is alert and oriented to person, place, and time.     Gait: Gait is intact.  Psychiatric:        Attention and Perception: Attention normal.        Mood and Affect: Mood normal.        Speech: Speech normal.        Behavior: Behavior normal. Behavior is cooperative.        Thought Content: Thought content normal.        Judgment: Judgment normal.     Results for orders placed or performed in visit on 10/31/23  CBC with Differential/Platelet   Collection Time: 10/31/23  9:05 AM  Result Value Ref Range   WBC 5.1 3.4 - 10.8 x10E3/uL   RBC 4.72 3.77 -  5.28 x10E6/uL   Hemoglobin 14.5 11.1 - 15.9 g/dL   Hematocrit 55.5 65.9 - 46.6 %   MCV 94 79 - 97 fL   MCH 30.7 26.6 - 33.0 pg   MCHC 32.7 31.5 - 35.7 g/dL   RDW 87.3 88.2 - 84.5 %   Platelets 297 150 - 450 x10E3/uL   Neutrophils 55 Not Estab. %   Lymphs 33 Not Estab. %   Monocytes 9 Not Estab. %   Eos 2 Not Estab. %   Basos 1 Not Estab. %   Neutrophils Absolute 2.8 1.4 - 7.0 x10E3/uL   Lymphocytes Absolute 1.7 0.7 - 3.1 x10E3/uL   Monocytes Absolute 0.5 0.1 - 0.9 x10E3/uL   EOS (ABSOLUTE) 0.1 0.0 - 0.4 x10E3/uL   Basophils Absolute 0.0 0.0 - 0.2 x10E3/uL   Immature Granulocytes 0 Not Estab. %   Immature Grans (Abs) 0.0 0.0 - 0.1 x10E3/uL  Comprehensive metabolic panel with GFR   Collection Time: 10/31/23  9:05 AM  Result Value Ref Range   Glucose 71 70 - 99 mg/dL   BUN 15 6 - 24 mg/dL   Creatinine, Ser 9.22 0.57 - 1.00 mg/dL   eGFR 89 >40 fO/fpw/8.26   BUN/Creatinine Ratio 19 9 - 23   Sodium 140 134 - 144 mmol/L   Potassium 4.6 3.5 - 5.2 mmol/L   Chloride 104 96 - 106 mmol/L   CO2 22 20 - 29 mmol/L   Calcium 9.4 8.7 - 10.2 mg/dL   Total Protein 6.8 6.0 - 8.5 g/dL   Albumin 4.5 3.8 -  4.9 g/dL   Globulin, Total 2.3 1.5 - 4.5 g/dL   Bilirubin Total 0.4 0.0 - 1.2 mg/dL   Alkaline Phosphatase 113 49 - 135 IU/L   AST 22 0 - 40 IU/L   ALT 10 0 - 32 IU/L  Lipid panel   Collection Time: 10/31/23  9:05 AM  Result Value Ref Range   Cholesterol, Total 234 (H) 100 - 199 mg/dL   Triglycerides 86 0 - 149 mg/dL   HDL 66 >60 mg/dL   VLDL Cholesterol Cal 15 5 - 40 mg/dL   LDL Chol Calc (NIH) 846 (H) 0 - 99 mg/dL   Chol/HDL Ratio 3.5 0.0 - 4.4 ratio  TSH   Collection Time: 10/31/23  9:05 AM  Result Value Ref Range   TSH 1.150 0.450 - 4.500 uIU/mL      Assessment & Plan:   Problem List Items Addressed This Visit   None Visit Diagnoses       Pre-op exam    -  Primary   EKG, CMP, CBC, Lipid and TSH checked at visit today.  No changes on EKG- however, it is limited by severe artifact. Patient is cleared for Colonoscopy.   Relevant Orders   CBC with Differential/Platelet   Comprehensive metabolic panel with GFR   Lipid panel   TSH   EKG 12-Lead        Follow up plan: No follow-ups on file.         [1]  Allergies Allergen Reactions   Vicodin [Hydrocodone -Acetaminophen ] Nausea And Vomiting   "

## 2024-01-17 ENCOUNTER — Ambulatory Visit: Payer: Self-pay | Admitting: Nurse Practitioner

## 2024-01-17 LAB — COMPREHENSIVE METABOLIC PANEL WITH GFR
ALT: 8 IU/L (ref 0–32)
AST: 16 IU/L (ref 0–40)
Albumin: 4.4 g/dL (ref 3.8–4.9)
Alkaline Phosphatase: 108 IU/L (ref 49–135)
BUN/Creatinine Ratio: 19 (ref 12–28)
BUN: 11 mg/dL (ref 8–27)
Bilirubin Total: 0.3 mg/dL (ref 0.0–1.2)
CO2: 23 mmol/L (ref 20–29)
Calcium: 9 mg/dL (ref 8.7–10.3)
Chloride: 104 mmol/L (ref 96–106)
Creatinine, Ser: 0.57 mg/dL (ref 0.57–1.00)
Globulin, Total: 1.9 g/dL (ref 1.5–4.5)
Glucose: 77 mg/dL (ref 70–99)
Potassium: 4.1 mmol/L (ref 3.5–5.2)
Sodium: 142 mmol/L (ref 134–144)
Total Protein: 6.3 g/dL (ref 6.0–8.5)
eGFR: 104 mL/min/1.73

## 2024-01-17 LAB — TSH: TSH: 0.882 u[IU]/mL (ref 0.450–4.500)

## 2024-01-17 LAB — CBC WITH DIFFERENTIAL/PLATELET
Basophils Absolute: 0 x10E3/uL (ref 0.0–0.2)
Basos: 1 %
EOS (ABSOLUTE): 0.1 x10E3/uL (ref 0.0–0.4)
Eos: 1 %
Hematocrit: 41.2 % (ref 34.0–46.6)
Hemoglobin: 13.2 g/dL (ref 11.1–15.9)
Immature Grans (Abs): 0 x10E3/uL (ref 0.0–0.1)
Immature Granulocytes: 0 %
Lymphocytes Absolute: 2 x10E3/uL (ref 0.7–3.1)
Lymphs: 38 %
MCH: 30.2 pg (ref 26.6–33.0)
MCHC: 32 g/dL (ref 31.5–35.7)
MCV: 94 fL (ref 79–97)
Monocytes Absolute: 0.4 x10E3/uL (ref 0.1–0.9)
Monocytes: 9 %
Neutrophils Absolute: 2.7 x10E3/uL (ref 1.4–7.0)
Neutrophils: 50 %
Platelets: 257 x10E3/uL (ref 150–450)
RBC: 4.37 x10E6/uL (ref 3.77–5.28)
RDW: 12.5 % (ref 11.7–15.4)
WBC: 5.2 x10E3/uL (ref 3.4–10.8)

## 2024-01-17 LAB — LIPID PANEL
Chol/HDL Ratio: 3 ratio (ref 0.0–4.4)
Cholesterol, Total: 203 mg/dL — ABNORMAL HIGH (ref 100–199)
HDL: 68 mg/dL
LDL Chol Calc (NIH): 116 mg/dL — ABNORMAL HIGH (ref 0–99)
Triglycerides: 108 mg/dL (ref 0–149)
VLDL Cholesterol Cal: 19 mg/dL (ref 5–40)

## 2024-01-29 ENCOUNTER — Other Ambulatory Visit: Payer: Self-pay | Admitting: Nurse Practitioner

## 2024-01-30 NOTE — Telephone Encounter (Signed)
 Requested Prescriptions  Pending Prescriptions Disp Refills   escitalopram  (LEXAPRO ) 20 MG tablet [Pharmacy Med Name: Escitalopram  Oxalate 20 MG Oral Tablet] 90 tablet 0    Sig: Take 1 tablet by mouth once daily     Psychiatry:  Antidepressants - SSRI Passed - 01/30/2024  2:19 PM      Passed - Completed PHQ-2 or PHQ-9 in the last 360 days      Passed - Valid encounter within last 6 months    Recent Outpatient Visits           2 weeks ago Pre-op exam   [place] [facility] Melvin Pao, NP   3 months ago Annual physical exam   [place] [facility] Melvin Pao, NP   9 months ago Mild episode of recurrent major depressive disorder   [place] [facility] Melvin Pao, NP               busPIRone  (BUSPAR ) 5 MG tablet [Pharmacy Med Name: busPIRone  HCl 5 MG Oral Tablet] 180 tablet 0    Sig: Take 1 tablet by mouth twice daily     Psychiatry: Anxiolytics/Hypnotics - Non-controlled Passed - 01/30/2024  2:19 PM      Passed - Valid encounter within last 12 months    Recent Outpatient Visits           2 weeks ago Pre-op exam   [place] [facility] Melvin Pao, NP   3 months ago Annual physical exam   [place] [facility] Melvin Pao, NP   9 months ago Mild episode of recurrent major depressive disorder   [place] [facility] Melvin Pao, NP

## 2024-02-05 IMAGING — CT CT NECK W/ CM
4 of 5 series · 14 of 33 positions shown, 16 images · IV contrast (agent unspecified)
Comparison: 04/14/2021

CLINICAL DATA: Soft tissue swelling. Recent removal of right
submandibular gland.

EXAM:
CT NECK WITH CONTRAST
TECHNIQUE: Multidetector CT imaging of the neck was performed using the
standard protocol following the bolus administration of intravenous
contrast.

[Series 2: axial neck · axial · 0.49mm/px · z∈[+92,+150]mm · 2 of 88 slices shown]
[im 30/88  bone]
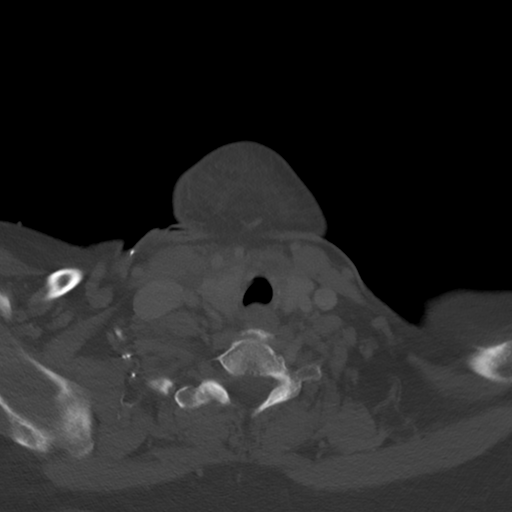
[im 59/88  bone]
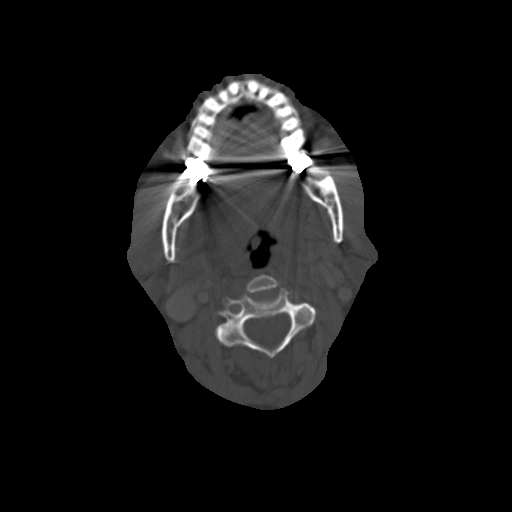

[Series 5: sag neck · sagittal · 0.47mm/px · 5 of 97 slices shown, 6 images]
[im 33/97  bone]
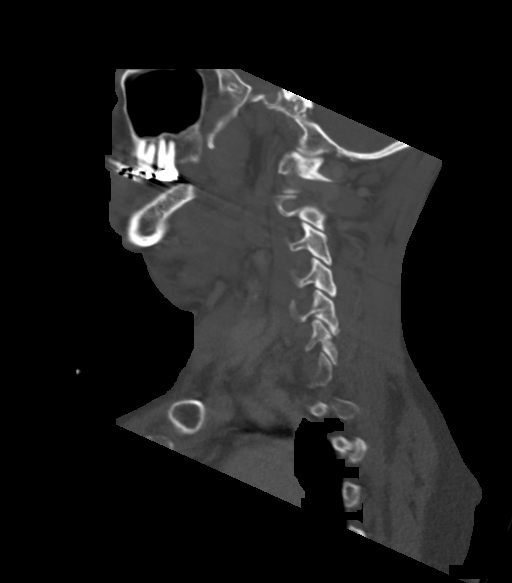
[im 41/97  bone]
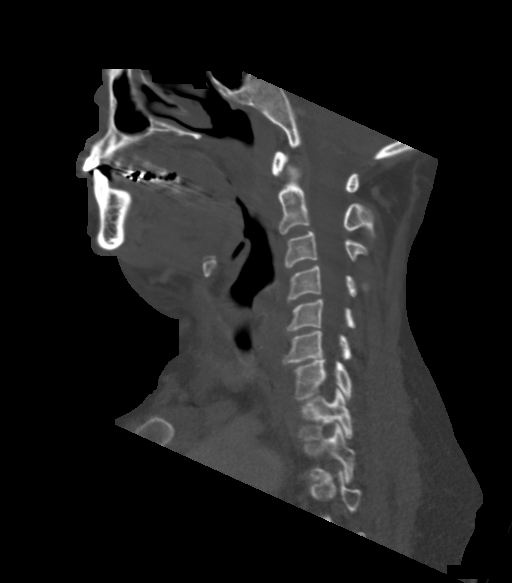
[im 49/97  soft-tissue]
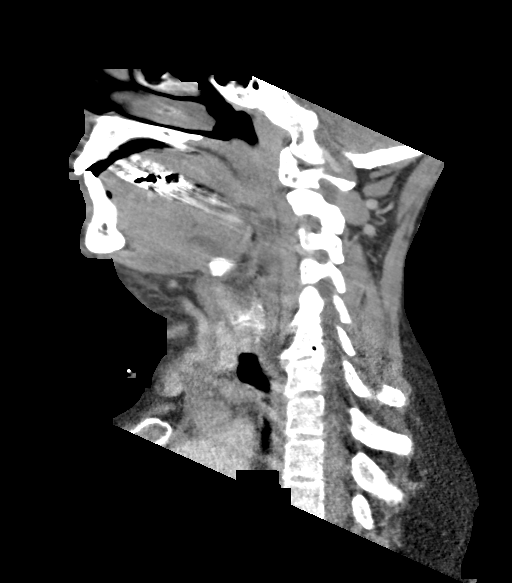
[im 49/97  bone]
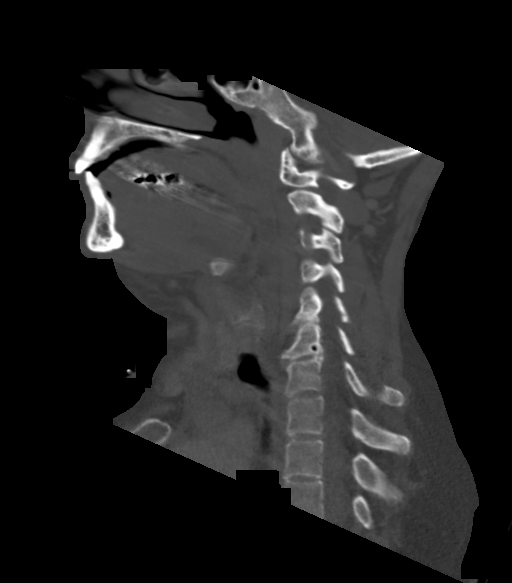
[im 57/97  bone]
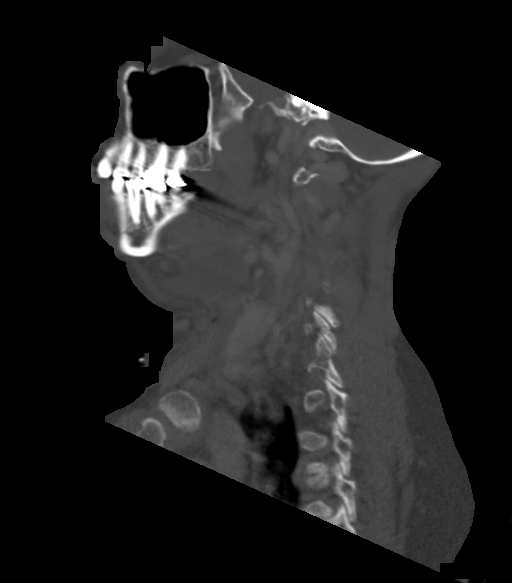
[im 65/97  bone]
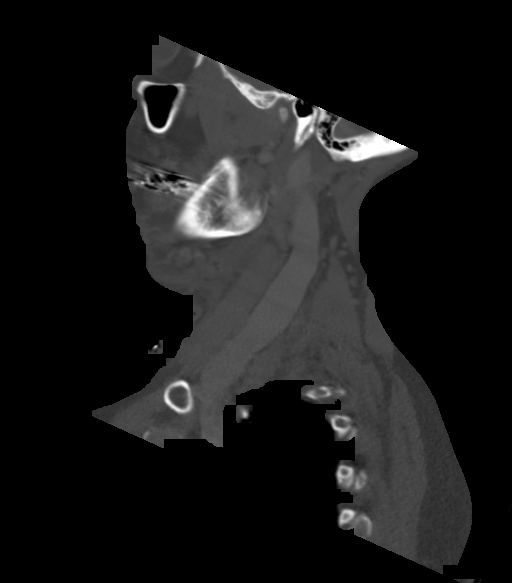

[Series 6: cor neck · coronal · 0.38mm/px · 3 of 120 slices shown]
[im 42/120  bone]
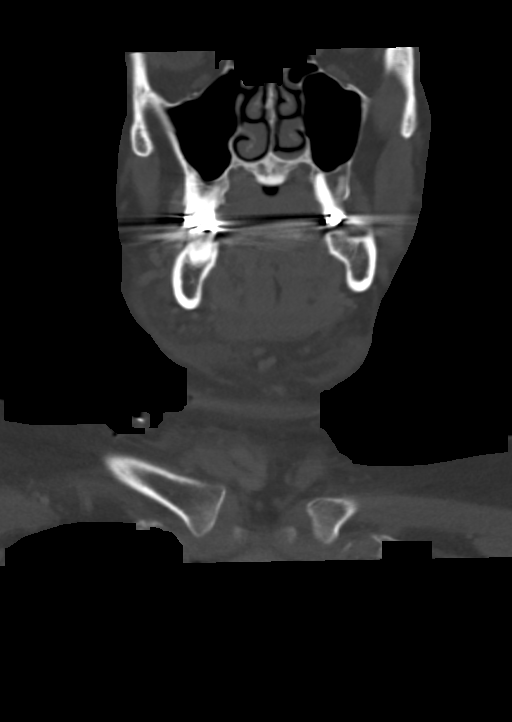
[im 54/120  bone]
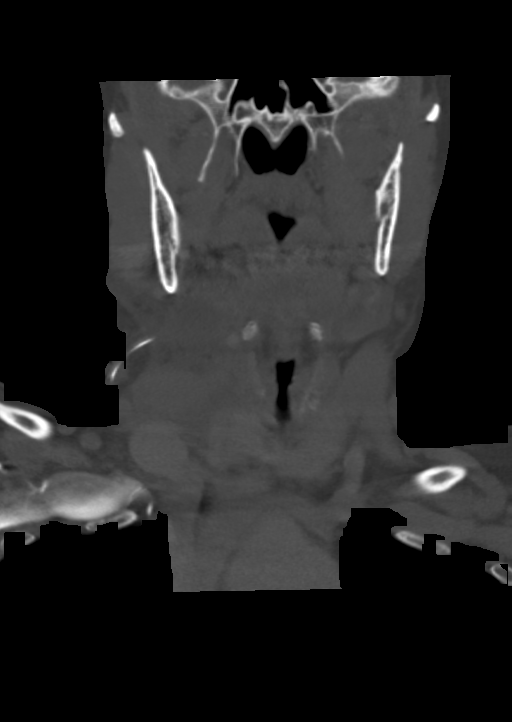
[im 66/120  bone]
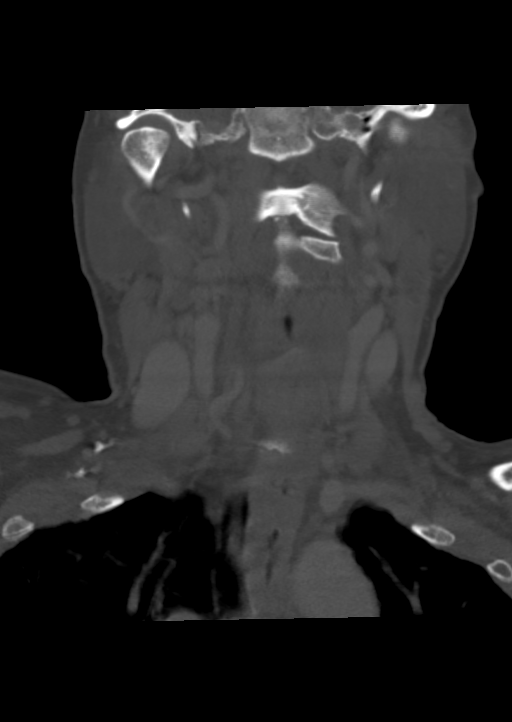

[Series 7: orthogonal (person_name) · axial · 0.38mm/px · z∈[-5,+143]mm · 4 of 137 slices shown, 5 images]
[im 28/137  soft-tissue]
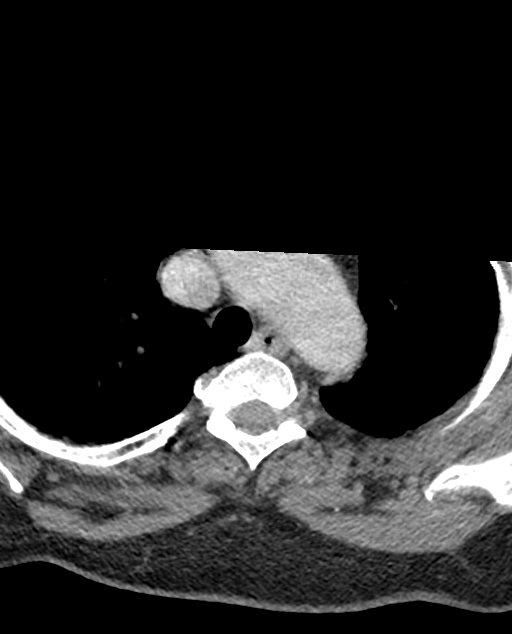
[im 28/137  bone]
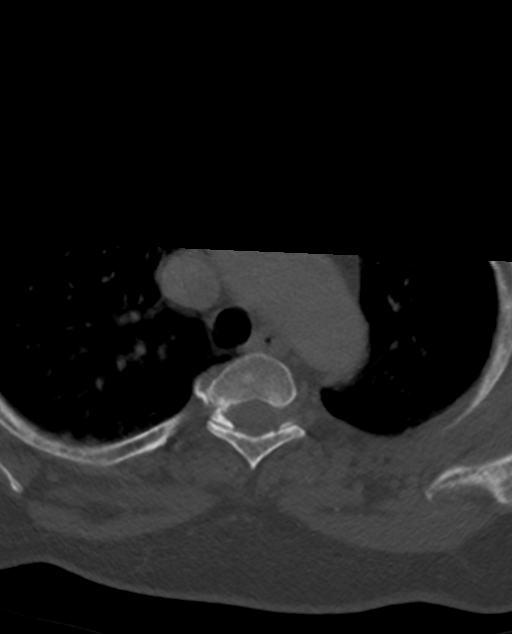
[im 55/137  bone]
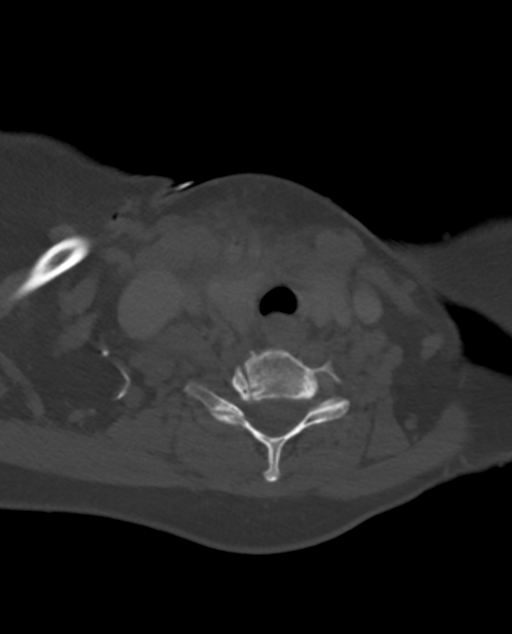
[im 82/137  bone]
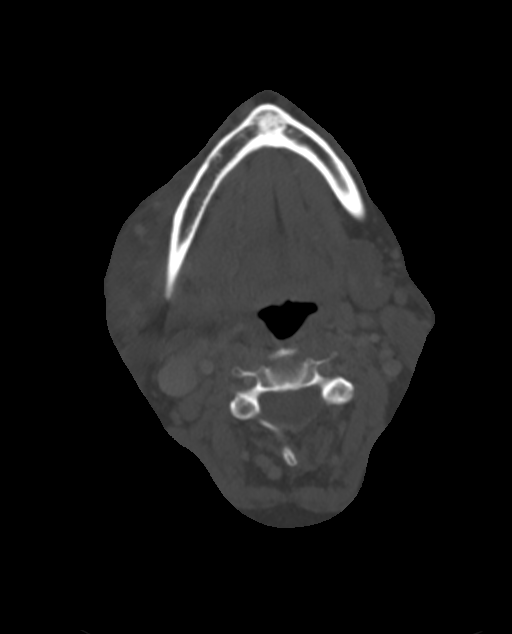
[im 109/137  bone]
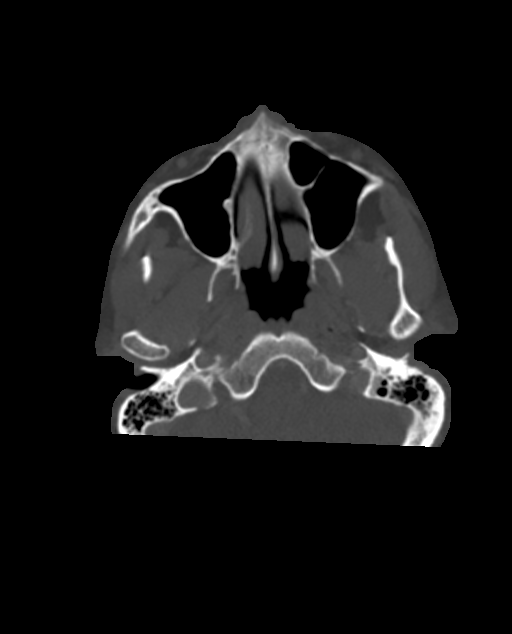

[14 of 33 positions shown; findings below may reference images not displayed]

RADIATION DOSE REDUCTION: This exam was performed according to the
departmental dose-optimization program which includes automated
exposure control, adjustment of the mA and/or kV according to
patient size and/or use of iterative reconstruction technique.

CONTRAST:  75mL OMNIPAQUE IOHEXOL 300 MG/ML  SOLN
FINDINGS: Pharynx and larynx: No evidence of mass or swelling.

Salivary glands: Resection of right submandibular gland with ill
organized fluid in its place and a few bubbles of gas. The fluid
density measures up to 2 cm in diameter and appears distinct from
the tip of a surgical drain. Regional subcutaneous edema tracking
into the lower neck.

Thyroid: Unremarkable

Lymph nodes: No worrisome nodal enlargement or heterogeneity.

Vascular: A few areas of venous gas usually from IV access.

Limited intracranial: Negative

Visualized orbits: Negative

Mastoids and visualized paranasal sinuses: Clear

Skeleton: No acute or aggressive finding

Upper chest: Negative
IMPRESSION: 1. Non organized postoperative fluid at the right submandibular
resection site. The tip of the surgical drain is just external to
the dominant area of fluid density.
2. No airway mass effect or narrowing.

## 2024-02-09 NOTE — Telephone Encounter (Signed)
 Medical Clearance pending for colonoscopy scheduled 03/01/24.  Routed to PCP office.  Thanks,  [place], CMA

## 2024-02-09 NOTE — Telephone Encounter (Signed)
"    01/17/24  8:04 AM Result Note HI Ms. Sellers. Your lab work looks good.  You are cleared to have your colonoscopy. CBC with Differential/Platelet; Comprehensive metabolic panel with GFR; Lipid panel; TSH Melvin Pao, NP to Cynthia Sellers     01/17/24  8:04 AM HI Ms. Sellers. Your lab work looks good.  You are cleared to have your colonoscopy.  This MyChart message has not been read. CBC with Differential/Platelet; Comprehensive metabolic panel with GFR; Lipid panel; TSH "

## 2024-02-09 NOTE — Telephone Encounter (Signed)
 Patient was also notified about the clearance sent in Mychart

## 2024-02-09 NOTE — Telephone Encounter (Signed)
 Patient has already been cleared. Note is in Epic.

## 2024-03-01 ENCOUNTER — Ambulatory Visit: Admit: 2024-03-01 | Admitting: Gastroenterology

## 2024-03-01 SURGERY — COLONOSCOPY
Anesthesia: General

## 2024-03-13 ENCOUNTER — Encounter: Admitting: Neurology

## 2024-05-01 ENCOUNTER — Ambulatory Visit: Admitting: Nurse Practitioner

## 2025-01-03 ENCOUNTER — Ambulatory Visit
